# Patient Record
Sex: Female | Born: 1975 | Race: Black or African American | Hispanic: No | Marital: Single | Smoking: Never smoker
Health system: Southern US, Community
[De-identification: ages and names within clinical notes are randomized; demographics above are authoritative.]

## PROBLEM LIST (undated history)

## (undated) DIAGNOSIS — M1990 Unspecified osteoarthritis, unspecified site: Secondary | ICD-10-CM

## (undated) DIAGNOSIS — T7840XA Allergy, unspecified, initial encounter: Secondary | ICD-10-CM

## (undated) DIAGNOSIS — E785 Hyperlipidemia, unspecified: Secondary | ICD-10-CM

## (undated) DIAGNOSIS — D649 Anemia, unspecified: Secondary | ICD-10-CM

## (undated) HISTORY — DX: Allergy, unspecified, initial encounter: T78.40XA

## (undated) HISTORY — DX: Anemia, unspecified: D64.9

## (undated) HISTORY — PX: WISDOM TOOTH EXTRACTION: SHX21

## (undated) HISTORY — DX: Unspecified osteoarthritis, unspecified site: M19.90

## (undated) HISTORY — DX: Hyperlipidemia, unspecified: E78.5

---

## 2006-01-31 ENCOUNTER — Ambulatory Visit: Payer: Self-pay | Admitting: Family Medicine

## 2006-06-10 ENCOUNTER — Other Ambulatory Visit: Admission: RE | Admit: 2006-06-10 | Discharge: 2006-06-10 | Payer: Self-pay | Admitting: Family Medicine

## 2006-06-10 ENCOUNTER — Ambulatory Visit: Payer: Self-pay | Admitting: Family Medicine

## 2006-06-10 ENCOUNTER — Encounter: Payer: Self-pay | Admitting: Family Medicine

## 2006-06-15 ENCOUNTER — Ambulatory Visit: Payer: Self-pay | Admitting: Family Medicine

## 2007-07-20 ENCOUNTER — Ambulatory Visit: Payer: Self-pay | Admitting: Family Medicine

## 2007-07-20 DIAGNOSIS — R82998 Other abnormal findings in urine: Secondary | ICD-10-CM | POA: Insufficient documentation

## 2007-07-20 DIAGNOSIS — N760 Acute vaginitis: Secondary | ICD-10-CM | POA: Insufficient documentation

## 2007-07-20 LAB — CONVERTED CEMR LAB
Glucose, Urine, Semiquant: NEGATIVE
Protein, U semiquant: NEGATIVE
pH: 6

## 2007-07-21 ENCOUNTER — Encounter: Payer: Self-pay | Admitting: Family Medicine

## 2007-07-27 ENCOUNTER — Encounter (INDEPENDENT_AMBULATORY_CARE_PROVIDER_SITE_OTHER): Payer: Self-pay | Admitting: *Deleted

## 2007-08-14 ENCOUNTER — Encounter: Payer: Self-pay | Admitting: Family Medicine

## 2007-08-14 ENCOUNTER — Ambulatory Visit: Payer: Self-pay | Admitting: Family Medicine

## 2007-08-14 ENCOUNTER — Other Ambulatory Visit: Admission: RE | Admit: 2007-08-14 | Discharge: 2007-08-14 | Payer: Self-pay | Admitting: Family Medicine

## 2007-08-21 ENCOUNTER — Encounter (INDEPENDENT_AMBULATORY_CARE_PROVIDER_SITE_OTHER): Payer: Self-pay | Admitting: *Deleted

## 2007-09-11 ENCOUNTER — Ambulatory Visit: Payer: Self-pay | Admitting: Family Medicine

## 2007-09-12 LAB — CONVERTED CEMR LAB
BUN: 12 mg/dL (ref 6–23)
Basophils Relative: 0.1 % (ref 0.0–1.0)
CO2: 29 meq/L (ref 19–32)
Calcium: 9.5 mg/dL (ref 8.4–10.5)
Creatinine, Ser: 0.8 mg/dL (ref 0.4–1.2)
HDL: 83.1 mg/dL (ref >39.0)
Hemoglobin: 13.5 g/dL (ref 12.0–15.0)
LDL Cholesterol: 94 mg/dL (ref 0–99)
Monocytes Absolute: 0.4 10*3/uL (ref 0.2–0.7)
Monocytes Relative: 6.4 % (ref 3.0–11.0)
Platelets: 275 10*3/uL (ref 150–400)
RDW: 11.7 % (ref 11.5–14.6)
VLDL: 19 mg/dL (ref 0–40)

## 2008-12-24 ENCOUNTER — Ambulatory Visit: Payer: Self-pay | Admitting: Family Medicine

## 2008-12-24 DIAGNOSIS — L988 Other specified disorders of the skin and subcutaneous tissue: Secondary | ICD-10-CM | POA: Insufficient documentation

## 2008-12-24 LAB — CONVERTED CEMR LAB
Bilirubin Urine: NEGATIVE
Blood in Urine, dipstick: NEGATIVE
Glucose, Urine, Semiquant: NEGATIVE
Protein, U semiquant: NEGATIVE
pH: 6

## 2008-12-25 ENCOUNTER — Encounter (INDEPENDENT_AMBULATORY_CARE_PROVIDER_SITE_OTHER): Payer: Self-pay | Admitting: *Deleted

## 2008-12-25 ENCOUNTER — Encounter: Payer: Self-pay | Admitting: Family Medicine

## 2008-12-25 LAB — CONVERTED CEMR LAB: Chlamydia, Swab/Urine, PCR: NEGATIVE

## 2009-01-13 ENCOUNTER — Encounter (INDEPENDENT_AMBULATORY_CARE_PROVIDER_SITE_OTHER): Payer: Self-pay | Admitting: *Deleted

## 2009-01-23 ENCOUNTER — Ambulatory Visit: Payer: Self-pay | Admitting: Family Medicine

## 2009-01-23 ENCOUNTER — Encounter: Payer: Self-pay | Admitting: Family Medicine

## 2009-01-23 ENCOUNTER — Other Ambulatory Visit: Admission: RE | Admit: 2009-01-23 | Discharge: 2009-01-23 | Payer: Self-pay | Admitting: Family Medicine

## 2009-01-23 DIAGNOSIS — D239 Other benign neoplasm of skin, unspecified: Secondary | ICD-10-CM | POA: Insufficient documentation

## 2009-01-23 LAB — HM PAP SMEAR

## 2009-01-27 ENCOUNTER — Encounter (INDEPENDENT_AMBULATORY_CARE_PROVIDER_SITE_OTHER): Payer: Self-pay | Admitting: *Deleted

## 2009-01-27 LAB — CONVERTED CEMR LAB
ALT: 11 units/L (ref 0–35)
AST: 17 units/L (ref 0–37)
Albumin: 4.2 g/dL (ref 3.5–5.2)
BUN: 9 mg/dL (ref 6–23)
Basophils Relative: 0.4 % (ref 0.0–3.0)
Chloride: 106 meq/L (ref 96–112)
Creatinine, Ser: 0.8 mg/dL (ref 0.4–1.2)
Eosinophils Absolute: 0 10*3/uL (ref 0.0–0.7)
Eosinophils Relative: 0.5 % (ref 0.0–5.0)
GFR calc non Af Amer: 88 mL/min
Glucose, Bld: 87 mg/dL (ref 70–99)
HCT: 38.9 % (ref 36.0–46.0)
MCV: 94 fL (ref 78.0–100.0)
Monocytes Relative: 8.1 % (ref 3.0–12.0)
Neutrophils Relative %: 58.3 % (ref 43.0–77.0)
RBC: 4.14 M/uL (ref 3.87–5.11)
TSH: 0.56 microintl units/mL (ref 0.35–5.50)
Total Protein: 7.1 g/dL (ref 6.0–8.3)
VLDL: 18 mg/dL (ref 0–40)
WBC: 6 10*3/uL (ref 4.5–10.5)

## 2010-05-15 ENCOUNTER — Ambulatory Visit: Payer: Self-pay | Admitting: Family Medicine

## 2010-05-15 DIAGNOSIS — L293 Anogenital pruritus, unspecified: Secondary | ICD-10-CM | POA: Insufficient documentation

## 2010-05-15 DIAGNOSIS — Z87448 Personal history of other diseases of urinary system: Secondary | ICD-10-CM | POA: Insufficient documentation

## 2010-05-15 LAB — CONVERTED CEMR LAB
Ketones, urine, test strip: NEGATIVE
Nitrite: NEGATIVE
Specific Gravity, Urine: 1.015
WBC Urine, dipstick: NEGATIVE

## 2010-07-06 ENCOUNTER — Ambulatory Visit: Payer: Self-pay | Admitting: Family Medicine

## 2010-07-07 ENCOUNTER — Encounter: Payer: Self-pay | Admitting: Family Medicine

## 2010-07-07 LAB — CONVERTED CEMR LAB
Candida species: NEGATIVE
Chlamydia, Swab/Urine, PCR: NEGATIVE
GC Probe Amp, Urine: NEGATIVE
Gardnerella vaginalis: NEGATIVE

## 2011-01-26 NOTE — Assessment & Plan Note (Signed)
Summary: vaginal itching//lch   Vital Signs:  Patient profile:   35 year old female Height:      68.25 inches Weight:      141 pounds BMI:     21.36 Pulse rate:   82 / minute Pulse rhythm:   regular BP sitting:   110 / 80  (left arm) Cuff size:   regular  Vitals Entered By: Army Fossa CMA (May 15, 2010 8:41 AM) CC: Pt here for vaginal itching, had a yeast infection a few months ago now has itching that comes and goes, worse in the am., Vaginal discharge   History of Present Illness:  Vaginal discharge      This is a 35 year old woman who presents with Vaginal discharge.  The symptoms began 2 months ago.  Pt here c/o vaginal itching.  She had a yeast infection a few months ago and treated it with otc med---itching never completely cleared up.  The patient complains of itching and vaginal burning, but denies burning on urination, frequency, urgency, fever, pelvic pain, and back pain.  The patient denies the following symptoms: genital sores, unusual vaginal bleeding, painful intercourse, rash, myalgias, arthralgias, and headache.  Prior treatment has included OTC antifungals.    Current Medications (verified): 1)  Metrogel-Vaginal 0.75 % Gel (Metronidazole) .Marland Kitchen.. 1 Applicator Pv At Bedtime  Allergies (verified): No Known Drug Allergies  Past History:  Past medical, surgical, family and social histories (including risk factors) reviewed for relevance to current acute and chronic problems.  Past Medical History: Reviewed history from 08/14/2007 and no changes required. none  Past Surgical History: Reviewed history from 08/14/2007 and no changes required. Denies surgical history  Family History: Reviewed history from 08/14/2007 and no changes required. MGM- DM, heart disease Family History of CAD Female 1st degree relative <50--F Family History High cholesterol Breast CA-- M aunt MI-- m aunt  Social History: Reviewed history from 08/14/2007 and no changes  required. 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 Genitalia:  + scant amount white d/c no other lesions no odor Extremities:  No clubbing, cyanosis, edema, or deformity noted with normal full range of motion of all joints.   Psych:  Oriented X3 and normally interactive.     Impression & Recommendations:  Problem # 1:  BACTERIAL VAGINITIS (ICD-616.10)  Her updated medication list for this problem includes:    Metrogel-vaginal 0.75 % Gel (Metronidazole) .Marland Kitchen... 1 applicator pv at bedtime  Discussed symptomatic relief and treatment options.   Orders: Wet Prep (16109UE) UA Dipstick w/o Micro (manual) (45409)  Complete Medication List: 1)  Metrogel-vaginal 0.75 % Gel (Metronidazole) .Marland Kitchen.. 1 applicator pv at bedtime  Other Orders: T-Culture, Urine ([id number]) Prescriptions: METROGEL-VAGINAL 0.75 % GEL (METRONIDAZOLE) 1 applicator pv at bedtime  #5 x 0   Entered and Authorized by:   Loreen Freud DO   Signed by:   Loreen Freud DO on 05/15/2010   Method used:   [facility]ronically to        [facility] [phone]* (retail)       [address]       [place], [place]  [address]  Ph: [phone]       Fax: [phone]   RxID:   [id number]   Laboratory Results   Urine Tests    Routine Urinalysis   Color: yellow Appearance: Clear Glucose: negative   (Normal Range: Negative) Bilirubin: negative   (Normal Range: Negative) Ketone: negative   (Normal Range: Negative) Spec. Gravity: 1.015   (Normal Range: 1.003-1.035) Blood: trace-intact   (Normal Range: Negative) pH: 6.0   (Normal Range:  5.0-8.0) Protein: negative   (Normal Range: Negative) Urobilinogen: 0.2   (Normal Range: 0-1) Nitrite: negative   (Normal Range: Negative) Leukocyte Esterace: negative   (Normal Range: Negative)    Urine HCG: negative Comments: Army Fossa CMA  May 15, 2010 8:49 AM    Wet Mount Source: vaginal WBC/hpf: 1-5 Bacteria/hpf: 1+  Cocci Clue cells/hpf: few  Negative whiff Yeast/hpf: none Wet Mount KOH: Negative Trichomonas/hpf: none

## 2011-01-26 NOTE — Assessment & Plan Note (Signed)
Summary: ROA AND INJ   Vital Signs:  Patient profile:   35 year old female Height:      68.25 inches Weight:      136 pounds Temp:     98.3 degrees F oral Pulse rate:   57 / minute BP sitting:   90 / 58  (left arm)  Vitals Entered By: Jeremy Johann CMA (July 06, 2010 11:04 AM) CC: infection no better, tetanus shot, Vaginal discharge Comments REVIEWED MED LIST, PATIENT AGREED DOSE AND INSTRUCTION CORRECT    History of Present Illness:  Vaginal discharge      This is a 35 year old woman who presents with Vaginal discharge.  The patient complains of itching and vaginal burning, but denies burning on urination, frequency, urgency, fever, pelvic pain, and back pain.  The patient denies the following symptoms: genital sores, unusual vaginal bleeding, painful intercourse, rash, myalgias, arthralgias, and headache.  Prior treatment has included metronidazole.    Allergies (verified): No Known Drug Allergies  Physical Exam  General:  Well-developed,well-nourished,in no acute distress; alert,appropriate and cooperative throughout examination Genitalia:  normal introitus, no external lesions, and vaginal discharge.   thin white d/c cervix red--no blood Psych:  Oriented X3 and normally interactive.     Impression & Recommendations:  Problem # 1:  VAGINAL PRURITUS (ICD-698.1) ? BV metronidazole 500 mg two times a day  Orders: TLB-Wet Mount / Fungus (87210-[id number]) T-GC Probe, urine [phone]) T-Chlamydia  Probe, urine ([id number]) UA Dipstick w/o Micro (manual) (30865) consider gyn if no better    Complete Medication List: 1)  Metronidazole 500 Mg Tabs (Metronidazole) .Marland Kitchen.. 1 by mouth two times a day for 7 days  Other Orders: Tdap => 60yrs IM (78469) Admin 1st Vaccine (62952) Admin 1st Vaccine [facility]) [phone]) Prescriptions: METRONIDAZOLE 500 MG TABS (METRONIDAZOLE) 1 by mouth two times a day for 7 days  #14 x 0   Entered and Authorized by:   Loreen Freud DO   Signed by:    Loreen Freud DO on 07/06/2010   Method used:   [facility] to        [facility] [phone]* (retail)       [address]       [place], [place]  [address]       Ph: [phone]       Fax: [phone]   RxID:   [id number]    Tetanus/Td Vaccine    Vaccine Type: Tdap    Site: right deltoid    Mfr: GlaxoSmithKline    Dose: 0.5 ml    Route: IM    Given by: Jeremy Johann CMA    Exp. Date: 07/12/2011    Lot #: [id number]    VIS given: 11/14/07 version given July 06, 2010.

## 2011-07-07 ENCOUNTER — Encounter: Payer: Self-pay | Admitting: Family Medicine

## 2011-07-09 ENCOUNTER — Encounter: Payer: Self-pay | Admitting: Family Medicine

## 2011-08-20 ENCOUNTER — Other Ambulatory Visit (HOSPITAL_COMMUNITY)
Admission: RE | Admit: 2011-08-20 | Discharge: 2011-08-20 | Disposition: A | Discharge status: Home / Self Care | Payer: 59 | Source: Ambulatory Visit | Attending: Family Medicine | Admitting: Family Medicine

## 2011-08-20 ENCOUNTER — Ambulatory Visit (INDEPENDENT_AMBULATORY_CARE_PROVIDER_SITE_OTHER): Payer: 59 | Admitting: Family Medicine

## 2011-08-20 ENCOUNTER — Encounter: Payer: Self-pay | Admitting: Family Medicine

## 2011-08-20 VITALS — BP 102/66 | HR 79 | Temp 97.9°F | Ht 68.5 in | Wt 143.0 lb

## 2011-08-20 DIAGNOSIS — Z0000 Encounter for general adult medical examination without abnormal findings: Secondary | ICD-10-CM

## 2011-08-20 DIAGNOSIS — Z01419 Encounter for gynecological examination (general) (routine) without abnormal findings: Secondary | ICD-10-CM | POA: Insufficient documentation

## 2011-08-20 LAB — LIPID PANEL
Cholesterol: 187 mg/dL (ref 0–200)
HDL: 83.9 mg/dL (ref >39.00)
LDL Cholesterol: 83 mg/dL (ref 0–99)
VLDL: 20 mg/dL (ref 0.0–40.0)

## 2011-08-20 LAB — BASIC METABOLIC PANEL
CO2: 26 mEq/L (ref 19–32)
Calcium: 9.2 mg/dL (ref 8.4–10.5)
GFR: 96.54 mL/min (ref >60.00)
Glucose, Bld: 86 mg/dL (ref 70–99)
Potassium: 4.2 mEq/L (ref 3.5–5.1)
Sodium: 138 mEq/L (ref 135–145)

## 2011-08-20 LAB — CBC WITH DIFFERENTIAL/PLATELET
Basophils Absolute: 0 10*3/uL (ref 0.0–0.1)
Basophils Relative: 0.6 % (ref 0.0–3.0)
HCT: 40.1 % (ref 36.0–46.0)
Hemoglobin: 13.4 g/dL (ref 12.0–15.0)
Lymphocytes Relative: 32.2 % (ref 12.0–46.0)
Lymphs Abs: 1.9 10*3/uL (ref 0.7–4.0)
MCHC: 33.6 g/dL (ref 30.0–36.0)
Monocytes Relative: 7.3 % (ref 3.0–12.0)
Neutro Abs: 3.6 10*3/uL (ref 1.4–7.7)
RBC: 4.19 Mil/uL (ref 3.87–5.11)
RDW: 13 % (ref 11.5–14.6)

## 2011-08-20 LAB — POCT URINALYSIS DIPSTICK
Protein, UA: NEGATIVE
Spec Grav, UA: 1.025
Urobilinogen, UA: 0.2
pH, UA: 5

## 2011-08-20 LAB — HEPATIC FUNCTION PANEL
AST: 15 U/L (ref 0–37)
Alkaline Phosphatase: 65 U/L (ref 39–117)
Total Bilirubin: 0.7 mg/dL (ref 0.3–1.2)

## 2011-08-20 LAB — TSH: TSH: 0.41 u[IU]/mL (ref 0.35–5.50)

## 2011-08-20 NOTE — Progress Notes (Signed)
  Subjective:     Jeanne Davis is a 35 y.o. female and is here for a comprehensive physical exam. The patient reports no problems.  History   Social History  . Marital Status: Single    Spouse Name: N/A    Number of Children: N/A  . Years of Education: N/A   Occupational History  . dept [employer] [place] [employer]    Social History Main Topics  . Smoking status: Never Smoker   . Smokeless tobacco: Never Used  . Alcohol Use: No  . Drug Use: No  . Sexually Active: No   Other Topics Concern  . Not on file   Social History Narrative  . No narrative on file   Health Maintenance  Topic Date Due  . Pap Smear  01/24/2012  . Tetanus/tdap  07/06/2020    The following portions of the patient's history were reviewed and updated as appropriate: allergies, current medications, past family history, past medical history, past social history, past surgical history and problem list.  Review of Systems Review of Systems  Constitutional: Negative for activity change, appetite change and fatigue.  HENT: Negative for hearing loss, congestion, tinnitus and ear discharge.  dentist q41m Eyes: Negative for visual disturbance (optho- due) Respiratory: Negative for cough, chest tightness and shortness of breath.   Cardiovascular: Negative for chest pain, palpitations and leg swelling.  Gastrointestinal: Negative for abdominal pain, diarrhea, constipation and abdominal distention.  Genitourinary: Negative for urgency, frequency, decreased urine volume and difficulty urinating.  Musculoskeletal: Negative for back pain, arthralgias and gait problem.  Skin: Negative for color change, pallor and rash.  Neurological: Negative for dizziness, light-headedness, numbness and headaches.  Hematological: Negative for adenopathy. Does not bruise/bleed easily.  Psychiatric/Behavioral: Negative for suicidal ideas, confusion, sleep disturbance, self-injury, dysphoric mood, decreased concentration and agitation.        Objective:    BP 102/66  Pulse 79  Temp(Src) 97.9 F (36.6 C) (Oral)  Ht 5' 8.5" (1.74 m)  Wt 143 lb (64.864 kg)  BMI 21.43 kg/m2  SpO2 99%  LMP 07/31/2011 General appearance: alert, cooperative, appears stated age and no distress Head: Normocephalic, without obvious abnormality, atraumatic Eyes: conjunctivae/corneas clear. PERRL, EOM's intact. Fundi benign. Ears: normal TM's and external ear canals both ears Nose: Nares normal. Septum midline. Mucosa normal. No drainage or sinus tenderness. Throat: lips, mucosa, and tongue normal; teeth and gums normal Neck: no adenopathy, no carotid bruit, no JVD, supple, symmetrical, trachea midline and thyroid not enlarged, symmetric, no tenderness/mass/nodules Back: symmetric, no curvature. ROM normal. No CVA tenderness. Lungs: clear to auscultation bilaterally Breasts: normal appearance, no masses or tenderness Heart: regular rate and rhythm, S1, S2 normal, no murmur, click, rub or gallop Abdomen: soft, non-tender; bowel sounds normal; no masses,  no organomegaly Pelvic: cervix normal in appearance, external genitalia normal, no adnexal masses or tenderness, no cervical motion tenderness, rectovaginal septum normal, uterus normal size, shape, and consistency and vagina normal without discharge Extremities: extremities normal, atraumatic, no cyanosis or edema Pulses: 2+ and symmetric Skin: Skin color, texture, turgor normal. No rashes or lesions Lymph nodes: Cervical, supraclavicular, and axillary nodes normal. Neurologic: Alert and oriented X 3, normal strength and tone. Normal symmetric reflexes. Normal coordination and gait psych-- no depression or anxiety    Assessment:    Healthy female exam.    Plan:  ghm utd  Check fasting labs See After Visit Summary for Counseling Recommendations

## 2011-08-20 NOTE — Patient Instructions (Signed)

## 2012-05-15 ENCOUNTER — Ambulatory Visit (INDEPENDENT_AMBULATORY_CARE_PROVIDER_SITE_OTHER): Payer: 59 | Admitting: Family Medicine

## 2012-05-15 ENCOUNTER — Encounter: Payer: Self-pay | Admitting: Family Medicine

## 2012-05-15 VITALS — BP 108/64 | HR 75 | Temp 99.1°F | Wt 145.8 lb

## 2012-05-15 DIAGNOSIS — N760 Acute vaginitis: Secondary | ICD-10-CM

## 2012-05-15 DIAGNOSIS — N390 Urinary tract infection, site not specified: Secondary | ICD-10-CM

## 2012-05-15 LAB — POCT URINALYSIS DIPSTICK
Bilirubin, UA: NEGATIVE
Nitrite, UA: NEGATIVE
Spec Grav, UA: 1.02
pH, UA: 6.5

## 2012-05-15 MED ORDER — TERCONAZOLE 0.4 % VA CREA: 1.0000 | TOPICAL_CREAM | Freq: Every day | VAGINAL | Status: AC

## 2012-05-15 NOTE — Progress Notes (Signed)
  Subjective:    Jeanne Davis is a 36 y.o. female who presents for sexually transmitted disease check. Sexual history reviewed with the patient. STI Exposure: denies knowledge of risky exposure. Previous history of bacterial vaginosis. Current symptoms vaginal discharge: white and thick, vaginal irritation: moderate. Contraception: none   No LMP recorded.    The following portions of the patient's history were reviewed and updated as appropriate: allergies, current medications, past family history, past medical history, past social history, past surgical history and problem list.  Review of Systems Pertinent items are noted in HPI.    Objective:    BP 108/64  Pulse 75  Temp(Src) 99.1 F (37.3 C) (Oral)  Wt 145 lb 12.8 oz (66.134 kg)  SpO2 99% General:   alert, cooperative, appears stated age and no distress  Lymph Nodes:   Cervical, supraclavicular, and axillary nodes normal.  Pelvis:  External genitalia: + errythema Vaginal: normal without tenderness, induration or masses and discharge, green and yellow  Cultures:  GC and Chlamydia genprobes and wet prep     Assessment:    Possible STD exposure    Plan:    Appropriate educational material was distributed Will call pt with results RTC PRN

## 2012-05-16 LAB — GC/CHLAMYDIA PROBE AMP, URINE
Chlamydia, Swab/Urine, PCR: NEGATIVE
GC Probe Amp, Urine: NEGATIVE

## 2012-05-16 LAB — URINE CULTURE: Organism ID, Bacteria: NO GROWTH

## 2012-05-16 LAB — WET PREP BY MOLECULAR PROBE
Candida species: NEGATIVE
Gardnerella vaginalis: NEGATIVE
Trichomonas vaginosis: NEGATIVE

## 2012-05-17 ENCOUNTER — Other Ambulatory Visit: Payer: Self-pay | Admitting: *Deleted

## 2012-05-17 ENCOUNTER — Telehealth: Payer: Self-pay

## 2012-05-17 MED ORDER — METRONIDAZOLE 0.75 % VA GEL: 1.0000 | Freq: Two times a day (BID) | VAGINAL | Status: AC

## 2012-05-17 MED ORDER — FLUCONAZOLE 150 MG PO TABS: 150.0000 mg | ORAL_TABLET | Freq: Once | ORAL | Status: AC

## 2012-05-17 NOTE — Telephone Encounter (Signed)
Discussed with patient and she voiced understanding. Patient was still having symptoms and so Dr.Lowne advise Metrogel and Diflucan and if no relief she will need to see GYN.      KP

## 2012-05-17 NOTE — Telephone Encounter (Signed)
Received call from Terri with [facility] wanting clarification on order for pt Metro Vag Gel, per instructions note insert 2 times daily, note the box comes with only 5, gave verbal order for pt to insert 1 at night times 5 days per verbal order from MD [facility] verbal order given to this nurse, rep understood and will fill with new directions.

## 2012-05-17 NOTE — Telephone Encounter (Signed)
Message copied by Arnette Norris on Wed May 17, 2012  3:03 PM ------      Message from: Lelon Perla      Created: Tue May 16, 2012 11:46 AM       gc / chlamydia neg

## 2013-09-17 ENCOUNTER — Encounter: Payer: Self-pay | Admitting: Family Medicine

## 2013-09-17 ENCOUNTER — Ambulatory Visit (INDEPENDENT_AMBULATORY_CARE_PROVIDER_SITE_OTHER): Payer: 59 | Admitting: Family Medicine

## 2013-09-17 VITALS — BP 102/68 | HR 80 | Temp 98.3°F | Wt 143.2 lb

## 2013-09-17 DIAGNOSIS — D179 Benign lipomatous neoplasm, unspecified: Secondary | ICD-10-CM

## 2013-09-17 DIAGNOSIS — D1779 Benign lipomatous neoplasm of other sites: Secondary | ICD-10-CM

## 2013-09-17 DIAGNOSIS — Z23 Encounter for immunization: Secondary | ICD-10-CM

## 2013-09-17 DIAGNOSIS — B360 Pityriasis versicolor: Secondary | ICD-10-CM | POA: Insufficient documentation

## 2013-09-17 DIAGNOSIS — D171 Benign lipomatous neoplasm of skin and subcutaneous tissue of trunk: Secondary | ICD-10-CM | POA: Insufficient documentation

## 2013-09-17 NOTE — Progress Notes (Signed)
  Subjective:    Patient ID: Jeanne Davis, female    DOB: May 03, 1976, 37 y.o.   MRN: [id number]  HPI Pt here c/o soft tissue mass L side upper back.  Nontender.  Pt does not know how long it has been there. She is also c/o about hypopigmentation low back.  Pt was diagnosed with tinea versicolor. Pt is also requesting a flu shot.     Review of Systems As above     Objective:   Physical Exam BP 102/68  Pulse 80  Temp(Src) 98.3 F (36.8 C) (Oral)  Wt 143 lb 3.2 oz (64.955 kg)  BMI 21.45 kg/m2  SpO2 99% General appearance: alert, cooperative, appears stated age and no distress Back: soft tissue mass upper R back Skin: hypopigmentation - back  C/w tinea versicolor        Assessment & Plan:

## 2013-09-17 NOTE — Assessment & Plan Note (Signed)
nizoral shampoo  Consider derm if no improvement

## 2013-09-17 NOTE — Patient Instructions (Signed)
Lipoma A lipoma is a noncancerous (benign) tumor composed of fat cells. They are usually found under the skin (subcutaneous). A lipoma may occur in any tissue of the body that contains fat. Common areas for lipomas to appear include the back, shoulders, buttocks, and thighs. Lipomas are a very common soft tissue growth. They are soft and grow slowly. Most problems caused by a lipoma depend on where it is growing. DIAGNOSIS  A lipoma can be diagnosed with a physical exam. These tumors rarely become cancerous, but radiographic studies can help determine this for certain. Studies used may include:  Computerized X-ray scans (CT or CAT scan).  Computerized magnetic scans (MRI). TREATMENT  Small lipomas that are not causing problems may be watched. If a lipoma continues to enlarge or causes problems, removal is often the best treatment. Lipomas can also be removed to improve appearance. Surgery is done to remove the fatty cells and the surrounding capsule. Most often, this is done with medicine that numbs the area (local anesthetic). The removed tissue is examined under a microscope to make sure it is not cancerous. Keep all follow-up appointments with your caregiver. SEEK MEDICAL CARE IF:   The lipoma becomes larger or hard.  The lipoma becomes painful, red, or increasingly swollen. These could be signs of infection or a more serious condition. Document Released: 12/03/2002 Document Revised: 03/06/2012 Document Reviewed: 05/15/2010 [facility] Patient Information 2014 [place], [place].  Tinea Versicolor Tinea versicolor is a common yeast infection of the skin. This condition becomes known when the yeast on our skin starts to overgrow (yeast is a normal inhabitant on our skin). This condition is noticed as white or light brown patches on brown skin, and is more evident in the summer on tanned skin. These areas are slightly scaly if scratched. The light patches from the yeast become evident when the yeast  creates "holes in your suntan". This is most often noticed in the summer. The patches are usually located on the chest, back, pubis, neck and body folds. However, it may occur on any area of body. Mild itching and inflammation (redness or soreness) may be present. DIAGNOSIS  The diagnosisof this is made clinically (by looking). Cultures from samples are usually not needed. Examination under the microscope may help. However, yeast is normally found on skin. The diagnosis still remains clinical. Examination under Wood's Ultraviolet Light can determine the extent of the infection. TREATMENT  This common infection is usually only of cosmetic (only a concern to your appearance). It is easily treated with dandruff shampoo used during showers or bathing. Vigorous scrubbing will eliminate the yeast over several days time. The light areas in your skin may remain for weeks or months after the infection is cured unless your skin is exposed to sunlight. The lighter or darker spots caused by the fungus that remain after complete treatment are not a sign of treatment failure; it will take a long time to resolve. Your caregiver may recommend a number of commercial preparations or medication by mouth if home care is not working. Recurrence is common and preventative medication may be necessary. This skin condition is not highly contagious. Special care is not needed to protect close friends and family members. Normal hygiene is usually enough. Follow up is required only if you develop complications (such as a secondary infection from scratching), if recommended by your caregiver, or if no relief is obtained from the preparations used. Document Released: 12/10/2000 Document Revised: 03/06/2012 Document Reviewed: 01/22/2009 [employer] Patient Information 2014 [place],  [employer].  

## 2013-09-17 NOTE — Assessment & Plan Note (Signed)
con't to watch Pt reassured

## 2014-07-10 ENCOUNTER — Encounter: Payer: Self-pay | Admitting: Family Medicine

## 2014-07-10 ENCOUNTER — Other Ambulatory Visit (HOSPITAL_COMMUNITY)
Admission: RE | Admit: 2014-07-10 | Discharge: 2014-07-10 | Disposition: A | Discharge status: Home / Self Care | Payer: 59 | Source: Ambulatory Visit | Attending: Family Medicine | Admitting: Family Medicine

## 2014-07-10 ENCOUNTER — Ambulatory Visit (INDEPENDENT_AMBULATORY_CARE_PROVIDER_SITE_OTHER): Payer: 59 | Admitting: Family Medicine

## 2014-07-10 VITALS — BP 110/70 | HR 67 | Temp 98.3°F | Ht 68.0 in | Wt 145.8 lb

## 2014-07-10 DIAGNOSIS — Z1151 Encounter for screening for human papillomavirus (HPV): Secondary | ICD-10-CM | POA: Insufficient documentation

## 2014-07-10 DIAGNOSIS — Z01419 Encounter for gynecological examination (general) (routine) without abnormal findings: Secondary | ICD-10-CM | POA: Insufficient documentation

## 2014-07-10 DIAGNOSIS — Z124 Encounter for screening for malignant neoplasm of cervix: Secondary | ICD-10-CM

## 2014-07-10 DIAGNOSIS — Z0000 Encounter for general adult medical examination without abnormal findings: Secondary | ICD-10-CM

## 2014-07-10 LAB — POCT URINALYSIS DIPSTICK
Bilirubin, UA: NEGATIVE
Blood, UA: NEGATIVE
Glucose, UA: NEGATIVE
KETONES UA: NEGATIVE
LEUKOCYTES UA: NEGATIVE
NITRITE UA: NEGATIVE
PH UA: 6.5
PROTEIN UA: NEGATIVE
Spec Grav, UA: 1.01
UROBILINOGEN UA: 0.2

## 2014-07-10 LAB — LIPID PANEL
CHOL/HDL RATIO: 2
Cholesterol: 193 mg/dL (ref 0–200)
HDL: 82.7 mg/dL (ref >39.00)
LDL CALC: 93 mg/dL (ref 0–99)
NONHDL: 110.3
Triglycerides: 85 mg/dL (ref 0.0–149.0)
VLDL: 17 mg/dL (ref 0.0–40.0)

## 2014-07-10 LAB — BASIC METABOLIC PANEL
BUN: 10 mg/dL (ref 6–23)
CALCIUM: 9.3 mg/dL (ref 8.4–10.5)
CO2: 26 mEq/L (ref 19–32)
Chloride: 103 mEq/L (ref 96–112)
Creatinine, Ser: 0.8 mg/dL (ref 0.4–1.2)
GFR: 90.68 mL/min (ref >60.00)
GLUCOSE: 99 mg/dL (ref 70–99)
POTASSIUM: 4 meq/L (ref 3.5–5.1)
SODIUM: 135 meq/L (ref 135–145)

## 2014-07-10 LAB — CBC WITH DIFFERENTIAL/PLATELET
BASOS ABS: 0 10*3/uL (ref 0.0–0.1)
Basophils Relative: 0.3 % (ref 0.0–3.0)
EOS PCT: 0.8 % (ref 0.0–5.0)
Eosinophils Absolute: 0 10*3/uL (ref 0.0–0.7)
HCT: 38.3 % (ref 36.0–46.0)
HEMOGLOBIN: 12.7 g/dL (ref 12.0–15.0)
LYMPHS PCT: 31.8 % (ref 12.0–46.0)
Lymphs Abs: 1.9 10*3/uL (ref 0.7–4.0)
MCHC: 33.2 g/dL (ref 30.0–36.0)
MCV: 94 fl (ref 78.0–100.0)
MONOS PCT: 6.9 % (ref 3.0–12.0)
Monocytes Absolute: 0.4 10*3/uL (ref 0.1–1.0)
Neutro Abs: 3.6 10*3/uL (ref 1.4–7.7)
Neutrophils Relative %: 60.2 % (ref 43.0–77.0)
PLATELETS: 293 10*3/uL (ref 150.0–400.0)
RBC: 4.07 Mil/uL (ref 3.87–5.11)
RDW: 13.1 % (ref 11.5–15.5)
WBC: 6 10*3/uL (ref 4.0–10.5)

## 2014-07-10 LAB — HEPATIC FUNCTION PANEL
ALK PHOS: 66 U/L (ref 39–117)
ALT: 13 U/L (ref 0–35)
AST: 15 U/L (ref 0–37)
Albumin: 4.1 g/dL (ref 3.5–5.2)
BILIRUBIN DIRECT: 0 mg/dL (ref 0.0–0.3)
BILIRUBIN TOTAL: 0.5 mg/dL (ref 0.2–1.2)
Total Protein: 7.1 g/dL (ref 6.0–8.3)

## 2014-07-10 LAB — TSH: TSH: 0.62 u[IU]/mL (ref 0.35–4.50)

## 2014-07-10 NOTE — Progress Notes (Signed)
Subjective:     Jeanne Davis is a 38 y.o. female and is here for a comprehensive physical exam. The patient reports no problems.  History   Social History  . Marital Status: Single    Spouse Name: N/A    Number of Children: N/A  . Years of Education: N/A   Occupational History  . dept [employer] History Main Topics  . Smoking status: Never Smoker   . Smokeless tobacco: Never Used  . Alcohol Use: No  . Drug Use: No  . Sexual Activity: No   Other Topics Concern  . Not on file   Social History Narrative  . No narrative on file   Health Maintenance  Topic Date Due  . Influenza Vaccine  07/27/2014  . Pap Smear  08/19/2014  . Tetanus/tdap  07/06/2020    The following portions of the patient's history were reviewed and updated as appropriate:  She  has no past medical history on file. She  does not have any pertinent problems on file. She  has no past surgical history on file. Her family history includes Breast cancer in her maternal aunt; Cancer in her maternal aunt, maternal aunt, maternal uncle, and paternal aunt; Coronary artery disease in her father; Diabetes in her father, maternal grandmother, and sister; Heart attack in her maternal aunt; Heart disease in her maternal grandmother; Hyperlipidemia in her father and mother; Hypertension in her father. She  reports that she has never smoked. She has never used smokeless tobacco. She reports that she does not drink alcohol or use illicit drugs. She currently has no medications in their medication list. No current outpatient prescriptions on file prior to visit.   No current facility-administered medications on file prior to visit.   She has No Known Allergies..  Review of Systems Review of Systems  Constitutional: Negative for activity change, appetite change and fatigue.  HENT: Negative for hearing loss, congestion, tinnitus and ear discharge.  dentist q69m Eyes: Negative for visual disturbance  (see optho-- no) Respiratory: Negative for cough, chest tightness and shortness of breath.   Cardiovascular: Negative for chest pain, palpitations and leg swelling.  Gastrointestinal: Negative for abdominal pain, diarrhea, constipation and abdominal distention.  Genitourinary: Negative for urgency, frequency, decreased urine volume and difficulty urinating.  Musculoskeletal: Negative for back pain, arthralgias and gait problem.  Skin: Negative for color change, pallor and rash.  Neurological: Negative for dizziness, light-headedness, numbness and headaches.  Hematological: Negative for adenopathy. Does not bruise/bleed easily.  Psychiatric/Behavioral: Negative for suicidal ideas, confusion, sleep disturbance, self-injury, dysphoric mood, decreased concentration and agitation.       Objective:    BP 110/70  Pulse 67  Temp(Src) 98.3 F (36.8 C) (Oral)  Ht 5\' 8"  (1.727 m)  Wt 145 lb 12.8 oz (66.134 kg)  BMI 22.17 kg/m2  SpO2 98%  LMP 06/28/2014 General appearance: alert, cooperative, appears stated age and no distress Head: Normocephalic, without obvious abnormality, atraumatic Eyes: conjunctivae/corneas clear. PERRL, EOM's intact. Fundi benign. Ears: normal TM's and external ear canals both ears Nose: Nares normal. Septum midline. Mucosa normal. No drainage or sinus tenderness. Throat: lips, mucosa, and tongue normal; teeth and gums normal Neck: no adenopathy, no carotid bruit, no JVD, supple, symmetrical, trachea midline and thyroid not enlarged, symmetric, no tenderness/mass/nodules Back: symmetric, no curvature. ROM normal. No CVA tenderness. Lungs: clear to auscultation bilaterally Breasts: normal appearance, no masses or tenderness Heart: regular rate and rhythm, S1, S2 normal, no murmur, click,  rub or gallop Abdomen: soft, non-tender; bowel sounds normal; no masses,  no organomegaly Pelvic: cervix normal in appearance, external genitalia normal, no adnexal masses or  tenderness, no cervical motion tenderness, rectovaginal septum normal, uterus normal size, shape, and consistency, vagina normal without discharge and pap done Extremities: extremities normal, atraumatic, no cyanosis or edema Pulses: 2+ and symmetric Skin: Skin color, texture, turgor normal. No rashes or lesions Lymph nodes: Cervical, supraclavicular, and axillary nodes normal. Neurologic: Alert and oriented X 3, normal strength and tone. Normal symmetric reflexes. Normal coordination and gait Psych--no anxiety, no depression      Assessment:    Healthy female exam.      Plan:     ghm utd Check labs See After Visit Summary for Counseling Recommendations

## 2014-07-10 NOTE — Patient Instructions (Signed)
Preventive Care for Adults A healthy lifestyle and preventive care can promote health and wellness. Preventive health guidelines for women include the following key practices.  A routine yearly physical is a good way to check with your health care provider about your health and preventive screening. It is a chance to share any concerns and updates on your health and to receive a thorough exam.  Visit your dentist for a routine exam and preventive care every 6 months. Brush your teeth twice a day and floss once a day. Good oral hygiene prevents tooth decay and gum disease.  The frequency of eye exams is based on your age, health, family medical history, use of contact lenses, and other factors. Follow your health care provider's recommendations for frequency of eye exams.  Eat a healthy diet. Foods like vegetables, fruits, whole grains, low-fat dairy products, and lean protein foods contain the nutrients you need without too many calories. Decrease your intake of foods high in solid fats, added sugars, and salt. Eat the right amount of calories for you.Get information about a proper diet from your health care provider, if necessary.  Regular physical exercise is one of the most important things you can do for your health. Most adults should get at least 150 minutes of moderate-intensity exercise (any activity that increases your heart rate and causes you to sweat) each week. In addition, most adults need muscle-strengthening exercises on 2 or more days a week.  Maintain a healthy weight. The body mass index (BMI) is a screening tool to identify possible weight problems. It provides an estimate of body fat based on height and weight. Your health care provider can find your BMI, and can help you achieve or maintain a healthy weight.For adults 20 years and older:  A BMI below 18.5 is considered underweight.  A BMI of 18.5 to 24.9 is normal.  A BMI of 25 to 29.9 is considered overweight.  A BMI of  30 and above is considered obese.  Maintain normal blood lipids and cholesterol levels by exercising and minimizing your intake of saturated fat. Eat a balanced diet with plenty of fruit and vegetables. Blood tests for lipids and cholesterol should begin at age 52 and be repeated every 5 years. If your lipid or cholesterol levels are high, you are over 50, or you are at high risk for heart disease, you may need your cholesterol levels checked more frequently.Ongoing high lipid and cholesterol levels should be treated with medicines if diet and exercise are not working.  If you smoke, find out from your health care provider how to quit. If you do not use tobacco, do not start.  Lung cancer screening is recommended for adults aged 37-80 years who are at high risk for developing lung cancer because of a history of smoking. A yearly low-dose CT scan of the lungs is recommended for people who have at least a 30-pack-year history of smoking and are a current smoker or have quit within the past 15 years. A pack year of smoking is smoking an average of 1 pack of cigarettes a day for 1 year (for example: 1 pack a day for 30 years or 2 packs a day for 15 years). Yearly screening should continue until the smoker has stopped smoking for at least 15 years. Yearly screening should be stopped for people who develop a health problem that would prevent them from having lung cancer treatment.  If you are pregnant, do not drink alcohol. If you are breastfeeding,  be very cautious about drinking alcohol. If you are not pregnant and choose to drink alcohol, do not have more than 1 drink per day. One drink is considered to be 12 ounces (355 mL) of beer, 5 ounces (148 mL) of wine, or 1.5 ounces (44 mL) of liquor.  Avoid use of street drugs. Do not share needles with anyone. Ask for help if you need support or instructions about stopping the use of drugs.  High blood pressure causes heart disease and increases the risk of  stroke. Your blood pressure should be checked at least every 1 to 2 years. Ongoing high blood pressure should be treated with medicines if weight loss and exercise do not work.  If you are 75-52 years old, ask your health care provider if you should take aspirin to prevent strokes.  Diabetes screening involves taking a blood sample to check your fasting blood sugar level. This should be done once every 3 years, after age 15, if you are within normal weight and without risk factors for diabetes. Testing should be considered at a younger age or be carried out more frequently if you are overweight and have at least 1 risk factor for diabetes.  Breast cancer screening is essential preventive care for women. You should practice "breast self-awareness." This means understanding the normal appearance and feel of your breasts and may include breast self-examination. Any changes detected, no matter how small, should be reported to a health care provider. Women in their 58s and 30s should have a clinical breast exam (CBE) by a health care provider as part of a regular health exam every 1 to 3 years. After age 16, women should have a CBE every year. Starting at age 53, women should consider having a mammogram (breast X-ray test) every year. Women who have a family history of breast cancer should talk to their health care provider about genetic screening. Women at a high risk of breast cancer should talk to their health care providers about having an MRI and a mammogram every year.  Breast cancer gene (BRCA)-related cancer risk assessment is recommended for women who have family members with BRCA-related cancers. BRCA-related cancers include breast, ovarian, tubal, and peritoneal cancers. Having family members with these cancers may be associated with an increased risk for harmful changes (mutations) in the breast cancer genes BRCA1 and BRCA2. Results of the assessment will determine the need for genetic counseling and  BRCA1 and BRCA2 testing.  Routine pelvic exams to screen for cancer are no longer recommended for nonpregnant women who are considered low risk for cancer of the pelvic organs (ovaries, uterus, and vagina) and who do not have symptoms. Ask your health care provider if a screening pelvic exam is right for you.  If you have had past treatment for cervical cancer or a condition that could lead to cancer, you need Pap tests and screening for cancer for at least 20 years after your treatment. If Pap tests have been discontinued, your risk factors (such as having a new sexual partner) need to be reassessed to determine if screening should be resumed. Some women have medical problems that increase the chance of getting cervical cancer. In these cases, your health care provider may recommend more frequent screening and Pap tests.  The HPV test is an additional test that may be used for cervical cancer screening. The HPV test looks for the virus that can cause the cell changes on the cervix. The cells collected during the Pap test can be  tested for HPV. The HPV test could be used to screen women aged 47 years and older, and should be used in women of any age who have unclear Pap test results. After the age of 36, women should have HPV testing at the same frequency as a Pap test.  Colorectal cancer can be detected and often prevented. Most routine colorectal cancer screening begins at the age of 38 years and continues through age 58 years. However, your health care provider may recommend screening at an earlier age if you have risk factors for colon cancer. On a yearly basis, your health care provider may provide home test kits to check for hidden blood in the stool. Use of a small camera at the end of a tube, to directly examine the colon (sigmoidoscopy or colonoscopy), can detect the earliest forms of colorectal cancer. Talk to your health care provider about this at age 64, when routine screening begins. Direct  exam of the colon should be repeated every 5-10 years through age 21 years, unless early forms of pre-cancerous polyps or small growths are found.  People who are at an increased risk for hepatitis B should be screened for this virus. You are considered at high risk for hepatitis B if:  You were born in a country where hepatitis B occurs often. Talk with your health care provider about which countries are considered high risk.  Your parents were born in a high-risk country and you have not received a shot to protect against hepatitis B (hepatitis B vaccine).  You have HIV or AIDS.  You use needles to inject street drugs.  You live with, or have sex with, someone who has Hepatitis B.  You get hemodialysis treatment.  You take certain medicines for conditions like cancer, organ transplantation, and autoimmune conditions.  Hepatitis C blood testing is recommended for all people born from [birth year] through 1965 and any individual with known risks for hepatitis C.  Practice safe sex. Use condoms and avoid high-risk sexual practices to reduce the spread of sexually transmitted infections (STIs). STIs include gonorrhea, chlamydia, syphilis, trichomonas, herpes, HPV, and human immunodeficiency virus (HIV). Herpes, HIV, and HPV are viral illnesses that have no cure. They can result in disability, cancer, and death.  You should be screened for sexually transmitted illnesses (STIs) including gonorrhea and chlamydia if:  You are sexually active and are younger than 24 years.  You are older than 24 years and your health care provider tells you that you are at risk for this type of infection.  Your sexual activity has changed since you were last screened and you are at an increased risk for chlamydia or gonorrhea. Ask your health care provider if you are at risk.  If you are at risk of being infected with HIV, it is recommended that you take a prescription medicine daily to prevent HIV infection. This is  called preexposure prophylaxis (PrEP). You are considered at risk if:  You are a heterosexual woman, are sexually active, and are at increased risk for HIV infection.  You take drugs by injection.  You are sexually active with a partner who has HIV.  Talk with your health care provider about whether you are at high risk of being infected with HIV. If you choose to begin PrEP, you should first be tested for HIV. You should then be tested every 3 months for as long as you are taking PrEP.  Osteoporosis is a disease in which the bones lose minerals and strength  with aging. This can result in serious bone fractures or breaks. The risk of osteoporosis can be identified using a bone density scan. Women ages 65 years and over and women at risk for fractures or osteoporosis should discuss screening with their health care providers. Ask your health care provider whether you should take a calcium supplement or vitamin D to reduce the rate of osteoporosis.  Menopause can be associated with physical symptoms and risks. Hormone replacement therapy is available to decrease symptoms and risks. You should talk to your health care provider about whether hormone replacement therapy is right for you.  Use sunscreen. Apply sunscreen liberally and repeatedly throughout the day. You should seek shade when your shadow is shorter than you. Protect yourself by wearing long sleeves, pants, a wide-brimmed hat, and sunglasses year round, whenever you are outdoors.  Once a month, do a whole body skin exam, using a mirror to look at the skin on your back. Tell your health care provider of new moles, moles that have irregular borders, moles that are larger than a pencil eraser, or moles that have changed in shape or color.  Stay current with required vaccines (immunizations).  Influenza vaccine. All adults should be immunized every year.  Tetanus, diphtheria, and acellular pertussis (Td, Tdap) vaccine. Pregnant women should  receive 1 dose of Tdap vaccine during each pregnancy. The dose should be obtained regardless of the length of time since the last dose. Immunization is preferred during the 27th-36th week of gestation. An adult who has not previously received Tdap or who does not know her vaccine status should receive 1 dose of Tdap. This initial dose should be followed by tetanus and diphtheria toxoids (Td) booster doses every 10 years. Adults with an unknown or incomplete history of completing a 3-dose immunization series with Td-containing vaccines should begin or complete a primary immunization series including a Tdap dose. Adults should receive a Td booster every 10 years.  Varicella vaccine. An adult without evidence of immunity to varicella should receive 2 doses or a second dose if she has previously received 1 dose. Pregnant females who do not have evidence of immunity should receive the first dose after pregnancy. This first dose should be obtained before leaving the health care facility. The second dose should be obtained 4-8 weeks after the first dose.  Human papillomavirus (HPV) vaccine. Females aged 13-26 years who have not received the vaccine previously should obtain the 3-dose series. The vaccine is not recommended for use in pregnant females. However, pregnancy testing is not needed before receiving a dose. If a female is found to be pregnant after receiving a dose, no treatment is needed. In that case, the remaining doses should be delayed until after the pregnancy. Immunization is recommended for any person with an immunocompromised condition through the age of 26 years if she did not get any or all doses earlier. During the 3-dose series, the second dose should be obtained 4-8 weeks after the first dose. The third dose should be obtained 24 weeks after the first dose and 16 weeks after the second dose.  Zoster vaccine. One dose is recommended for adults aged 60 years or older unless certain conditions are  present.  Measles, mumps, and rubella (MMR) vaccine. Adults born before 1957 generally are considered immune to measles and mumps. Adults born in 1957 or later should have 1 or more doses of MMR vaccine unless there is a contraindication to the vaccine or there is laboratory evidence of immunity to   each of the three diseases. A routine second dose of MMR vaccine should be obtained at least 28 days after the first dose for students attending postsecondary schools, health care workers, or international travelers. People who received inactivated measles vaccine or an unknown type of measles vaccine during 1963-1967 should receive 2 doses of MMR vaccine. People who received inactivated mumps vaccine or an unknown type of mumps vaccine before 1979 and are at high risk for mumps infection should consider immunization with 2 doses of MMR vaccine. For females of childbearing age, rubella immunity should be determined. If there is no evidence of immunity, females who are not pregnant should be vaccinated. If there is no evidence of immunity, females who are pregnant should delay immunization until after pregnancy. Unvaccinated health care workers born before 1957 who lack laboratory evidence of measles, mumps, or rubella immunity or laboratory confirmation of disease should consider measles and mumps immunization with 2 doses of MMR vaccine or rubella immunization with 1 dose of MMR vaccine.  Pneumococcal 13-valent conjugate (PCV13) vaccine. When indicated, a person who is uncertain of her immunization history and has no record of immunization should receive the PCV13 vaccine. An adult aged 19 years or older who has certain medical conditions and has not been previously immunized should receive 1 dose of PCV13 vaccine. This PCV13 should be followed with a dose of pneumococcal polysaccharide (PPSV23) vaccine. The PPSV23 vaccine dose should be obtained at least 8 weeks after the dose of PCV13 vaccine. An adult aged 19  years or older who has certain medical conditions and previously received 1 or more doses of PPSV23 vaccine should receive 1 dose of PCV13. The PCV13 vaccine dose should be obtained 1 or more years after the last PPSV23 vaccine dose.  Pneumococcal polysaccharide (PPSV23) vaccine. When PCV13 is also indicated, PCV13 should be obtained first. All adults aged 65 years and older should be immunized. An adult younger than age 65 years who has certain medical conditions should be immunized. Any person who resides in a nursing home or long-term care facility should be immunized. An adult smoker should be immunized. People with an immunocompromised condition and certain other conditions should receive both PCV13 and PPSV23 vaccines. People with human immunodeficiency virus (HIV) infection should be immunized as soon as possible after diagnosis. Immunization during chemotherapy or radiation therapy should be avoided. Routine use of PPSV23 vaccine is not recommended for American Indians, [place] Natives, or people younger than 65 years unless there are medical conditions that require PPSV23 vaccine. When indicated, people who have unknown immunization and have no record of immunization should receive PPSV23 vaccine. One-time revaccination 5 years after the first dose of PPSV23 is recommended for people aged 19-64 years who have chronic kidney failure, nephrotic syndrome, asplenia, or immunocompromised conditions. People who received 1-2 doses of PPSV23 before age 65 years should receive another dose of PPSV23 vaccine at age 65 years or later if at least 5 years have passed since the previous dose. Doses of PPSV23 are not needed for people immunized with PPSV23 at or after age 65 years.  Meningococcal vaccine. Adults with asplenia or persistent complement component deficiencies should receive 2 doses of quadrivalent meningococcal conjugate (MenACWY-D) vaccine. The doses should be obtained at least 2 months apart.  Microbiologists working with certain meningococcal bacteria, military recruits, people at risk during an outbreak, and people who travel to or live in countries with a high rate of meningitis should be immunized. A first-year college student up through age   21 years who is living in a residence hall should receive a dose if she did not receive a dose on or after her 16th birthday. Adults who have certain high-risk conditions should receive one or more doses of vaccine.  Hepatitis A vaccine. Adults who wish to be protected from this disease, have certain high-risk conditions, work with hepatitis A-infected animals, work in hepatitis A research labs, or travel to or work in countries with a high rate of hepatitis A should be immunized. Adults who were previously unvaccinated and who anticipate close contact with an international adoptee during the first 60 days after arrival in the [place] from a country with a high rate of hepatitis A should be immunized.  Hepatitis B vaccine. Adults who wish to be protected from this disease, have certain high-risk conditions, may be exposed to blood or other infectious body fluids, are household contacts or sex partners of hepatitis B positive people, are clients or workers in certain care facilities, or travel to or work in countries with a high rate of hepatitis B should be immunized.  Haemophilus influenzae type b (Hib) vaccine. A previously unvaccinated person with asplenia or sickle cell disease or having a scheduled splenectomy should receive 1 dose of Hib vaccine. Regardless of previous immunization, a recipient of a hematopoietic stem cell transplant should receive a 3-dose series 6-12 months after her successful transplant. Hib vaccine is not recommended for adults with HIV infection. Preventive Services / Frequency Ages 43 to 39years  Blood pressure check.** / Every 1 to 2 years.  Lipid and cholesterol check.** / Every 5 years beginning at age  75.  Clinical breast exam.** / Every 3 years for women in their 32s and 74s.  BRCA-related cancer risk assessment.** / For women who have family members with a BRCA-related cancer (breast, ovarian, tubal, or peritoneal cancers).  Pap test.** / Every 2 years from ages 65 through 91. Every 3 years starting at age 34 through age 93 or 72 with a history of 3 consecutive normal Pap tests.  HPV screening.** / Every 3 years from ages 46 through ages 53 to 26 with a history of 3 consecutive normal Pap tests.  Hepatitis C blood test.** / For any individual with known risks for hepatitis C.  Skin self-exam. / Monthly.  Influenza vaccine. / Every year.  Tetanus, diphtheria, and acellular pertussis (Tdap, Td) vaccine.** / Consult your health care provider. Pregnant women should receive 1 dose of Tdap vaccine during each pregnancy. 1 dose of Td every 10 years.  Varicella vaccine.** / Consult your health care provider. Pregnant females who do not have evidence of immunity should receive the first dose after pregnancy.  HPV vaccine. / 3 doses over 6 months, if 70 and younger. The vaccine is not recommended for use in pregnant females. However, pregnancy testing is not needed before receiving a dose.  Measles, mumps, rubella (MMR) vaccine.** / You need at least 1 dose of MMR if you were born in 1957 or later. You may also need a 2nd dose. For females of childbearing age, rubella immunity should be determined. If there is no evidence of immunity, females who are not pregnant should be vaccinated. If there is no evidence of immunity, females who are pregnant should delay immunization until after pregnancy.  Pneumococcal 13-valent conjugate (PCV13) vaccine.** / Consult your health care provider.  Pneumococcal polysaccharide (PPSV23) vaccine.** / 1 to 2 doses if you smoke cigarettes or if you have certain conditions.  Meningococcal vaccine.** /  1 dose if you are age 70 to 51 years and a [place] living in a residence hall, or have one of several medical conditions, you need to get vaccinated against meningococcal disease. You may also need additional booster doses.  Hepatitis A vaccine.** / Consult your health care provider.  Hepatitis B vaccine.** / Consult your health care provider.  Haemophilus influenzae type b (Hib) vaccine.** / Consult your health care provider. Ages 40 to 64years  Blood pressure check.** / Every 1 to 2 years.  Lipid and cholesterol check.** / Every 5 years beginning at age 58 years.  Lung cancer screening. / Every year if you are aged 56-80 years and have a 30-pack-year history of smoking and currently smoke or have quit within the past 15 years. Yearly screening is stopped once you have quit smoking for at least 15 years or develop a health problem that would prevent you from having lung cancer treatment.  Clinical breast exam.** / Every year after age 35 years.  BRCA-related cancer risk assessment.** / For women who have family members with a BRCA-related cancer (breast, ovarian, tubal, or peritoneal cancers).  Mammogram.** / Every year beginning at age 109 years and continuing for as long as you are in good health. Consult with your health care provider.  Pap test.** / Every 3 years starting at age 44 years through age 94 or 70 years with a history of 3 consecutive normal Pap tests.  HPV screening.** / Every 3 years from ages 109 years through ages 50 to 30 years with a history of 3 consecutive normal Pap tests.  Fecal occult blood test (FOBT) of stool. / Every year beginning at age 73 years and continuing until age 59 years. You may not need to do this test if you get a colonoscopy every 10 years.  Flexible sigmoidoscopy or colonoscopy.** / Every 5 years for a flexible sigmoidoscopy or every 10 years for a colonoscopy beginning at age 68 years and continuing until age 12 years.  Hepatitis C blood test.** / For all people born from [birth year] through  1965 and any individual with known risks for hepatitis C.  Skin self-exam. / Monthly.  Influenza vaccine. / Every year.  Tetanus, diphtheria, and acellular pertussis (Tdap/Td) vaccine.** / Consult your health care provider. Pregnant women should receive 1 dose of Tdap vaccine during each pregnancy. 1 dose of Td every 10 years.  Varicella vaccine.** / Consult your health care provider. Pregnant females who do not have evidence of immunity should receive the first dose after pregnancy.  Zoster vaccine.** / 1 dose for adults aged 2 years or older.  Measles, mumps, rubella (MMR) vaccine.** / You need at least 1 dose of MMR if you were born in 1957 or later. You may also need a 2nd dose. For females of childbearing age, rubella immunity should be determined. If there is no evidence of immunity, females who are not pregnant should be vaccinated. If there is no evidence of immunity, females who are pregnant should delay immunization until after pregnancy.  Pneumococcal 13-valent conjugate (PCV13) vaccine.** / Consult your health care provider.  Pneumococcal polysaccharide (PPSV23) vaccine.** / 1 to 2 doses if you smoke cigarettes or if you have certain conditions.  Meningococcal vaccine.** / Consult your health care provider.  Hepatitis A vaccine.** / Consult your health care provider.  Hepatitis B vaccine.** / Consult your health care provider.  Haemophilus influenzae type b (Hib) vaccine.** / Consult your health care provider. Ages 48 years  and over  Blood pressure check.** / Every 1 to 2 years.  Lipid and cholesterol check.** / Every 5 years beginning at age 84 years.  Lung cancer screening. / Every year if you are aged 50-80 years and have a 30-pack-year history of smoking and currently smoke or have quit within the past 15 years. Yearly screening is stopped once you have quit smoking for at least 15 years or develop a health problem that would prevent you from having lung cancer  treatment.  Clinical breast exam.** / Every year after age 24 years.  BRCA-related cancer risk assessment.** / For women who have family members with a BRCA-related cancer (breast, ovarian, tubal, or peritoneal cancers).  Mammogram.** / Every year beginning at age 14 years and continuing for as long as you are in good health. Consult with your health care provider.  Pap test.** / Every 3 years starting at age 17 years through age 31 or 74 years with 3 consecutive normal Pap tests. Testing can be stopped between 65 and 70 years with 3 consecutive normal Pap tests and no abnormal Pap or HPV tests in the past 10 years.  HPV screening.** / Every 3 years from ages 30 years through ages 70 or 28 years with a history of 3 consecutive normal Pap tests. Testing can be stopped between 65 and 70 years with 3 consecutive normal Pap tests and no abnormal Pap or HPV tests in the past 10 years.  Fecal occult blood test (FOBT) of stool. / Every year beginning at age 64 years and continuing until age 92 years. You may not need to do this test if you get a colonoscopy every 10 years.  Flexible sigmoidoscopy or colonoscopy.** / Every 5 years for a flexible sigmoidoscopy or every 10 years for a colonoscopy beginning at age 73 years and continuing until age 39 years.  Hepatitis C blood test.** / For all people born from [birth year] through 1965 and any individual with known risks for hepatitis C.  Osteoporosis screening.** / A one-time screening for women ages 35 years and over and women at risk for fractures or osteoporosis.  Skin self-exam. / Monthly.  Influenza vaccine. / Every year.  Tetanus, diphtheria, and acellular pertussis (Tdap/Td) vaccine.** / 1 dose of Td every 10 years.  Varicella vaccine.** / Consult your health care provider.  Zoster vaccine.** / 1 dose for adults aged 59 years or older.  Pneumococcal 13-valent conjugate (PCV13) vaccine.** / Consult your health care provider.  Pneumococcal  polysaccharide (PPSV23) vaccine.** / 1 dose for all adults aged 8 years and older.  Meningococcal vaccine.** / Consult your health care provider.  Hepatitis A vaccine.** / Consult your health care provider.  Hepatitis B vaccine.** / Consult your health care provider.  Haemophilus influenzae type b (Hib) vaccine.** / Consult your health care provider. ** Family history and personal history of risk and conditions may change your health care provider's recommendations. Document Released: 02/08/2002 Document Revised: 12/18/2013 Document Reviewed: 05/10/2011 [facility] Patient Information 2015 [place], [place]. This information is not intended to replace advice given to you by your health care provider. Make sure you discuss any questions you have with your health care provider.

## 2014-07-10 NOTE — Addendum Note (Signed)
Addended by: Ewing Schlein on: 07/10/2014 09:44 AM   Modules accepted: Orders

## 2014-07-10 NOTE — Progress Notes (Signed)
Pre visit review using our clinic review tool, if applicable. No additional management support is needed unless otherwise documented below in the visit note. 

## 2014-07-11 LAB — CYTOLOGY - PAP

## 2014-07-15 ENCOUNTER — Encounter: Payer: Self-pay | Admitting: General Practice

## 2015-06-10 ENCOUNTER — Ambulatory Visit (INDEPENDENT_AMBULATORY_CARE_PROVIDER_SITE_OTHER): Payer: 59 | Admitting: Medical

## 2015-06-10 ENCOUNTER — Encounter: Payer: Self-pay | Admitting: Medical

## 2015-06-10 VITALS — BP 105/71 | HR 104 | Temp 99.0°F | Ht 68.0 in | Wt 143.6 lb

## 2015-06-10 DIAGNOSIS — J209 Acute bronchitis, unspecified: Secondary | ICD-10-CM | POA: Insufficient documentation

## 2015-06-10 MED ORDER — HYDROCODONE-HOMATROPINE 5-1.5 MG/5ML PO SYRP: 5.0000 mL | ORAL_SOLUTION | Freq: Three times a day (TID) | ORAL | Status: DC | PRN

## 2015-06-10 MED ORDER — MOMETASONE FUROATE 50 MCG/ACT NA SUSP: NASAL | Status: DC

## 2015-06-10 MED ORDER — AZITHROMYCIN 250 MG PO TABS: ORAL_TABLET | ORAL | Status: DC

## 2015-06-10 NOTE — Assessment & Plan Note (Signed)
You appear to have bronchitis. Rest hydrate and tylenol for fever. I am prescribing cough medicine hydromet, and azithromycin antibiotic. For your nasal congestion nasonex nasal spray.  You should gradually get better. If not then notify us and would recommend a chest xray.  Follow up in 7-10 days or as needed

## 2015-06-10 NOTE — Progress Notes (Signed)
Pre visit review using our clinic review tool, if applicable. No additional management support is needed unless otherwise documented below in the visit note. 

## 2015-06-10 NOTE — Patient Instructions (Signed)
Acute bronchitis You appear to have bronchitis. Rest hydrate and tylenol for fever. I am prescribing cough medicine hydromet, and azithromycin antibiotic. For your nasal congestion nasonex nasal spray.  You should gradually get better. If not then notify us and would recommend a chest xray.  Follow up in 7-10 days or as needed

## 2015-06-10 NOTE — Progress Notes (Signed)
Subjective:    Patient ID: Jeanne Davis, female    DOB: 21-Feb-1976, 39 y.o.   MRN: [id number]  HPI   Pt in states since past wed nasal and chest congestion. No sinus pressure. Coughing up mucous. No hx of bronchitis. Pt not a smoker. No fever, no chills or sweats.   Cough is keeping her up at night. No wheezing. NO use of inhalers in her life.  Intermittent cough all day long. Pt not sleeping well with cough. Pt tried some med otc(Tussin CF)  LMP- Saturday.   Review of Systems  Constitutional: Negative for fever, chills and fatigue.  HENT: Positive for congestion. Negative for ear discharge, ear pain, facial swelling, mouth sores, postnasal drip, sneezing, sore throat and trouble swallowing.   Respiratory: Positive for cough. Negative for choking, shortness of breath and wheezing.   Cardiovascular: Negative for chest pain and palpitations.  Gastrointestinal: Negative for abdominal pain and anal bleeding.  Musculoskeletal: Negative for back pain.  Neurological: Negative for seizures, facial asymmetry, speech difficulty, weakness, numbness and headaches.  Hematological: Negative for adenopathy. Does not bruise/bleed easily.  Psychiatric/Behavioral: Negative for behavioral problems and confusion.     No past medical history on file.  History   Social History  . Marital Status: Single    Spouse Name: N/A  . Number of Children: N/A  . Years of Education: N/A   Occupational History  . dept [employer] History Main Topics  . Smoking status: Never Smoker   . Smokeless tobacco: Never Used  . Alcohol Use: No  . Drug Use: No  . Sexual Activity: No   Other Topics Concern  . Not on file   Social History Narrative    No past surgical history on file.  Family History  Problem Relation Age of Onset  . Diabetes Maternal Grandmother   . Heart disease Maternal Grandmother     CHF  . Coronary artery disease Father   . Hyperlipidemia Father   . Diabetes  Father   . Hypertension Father   . Breast cancer Maternal Aunt   . Cancer Maternal Aunt     breast ca  . Heart attack Maternal Aunt   . Cancer Maternal Aunt     fallopian tubes  . Hyperlipidemia Mother   . Diabetes Sister     type I---half sister  . Cancer Maternal Uncle     tumor kidney, colon cancer  . Cancer Paternal Aunt      breast    No Known Allergies  No current outpatient prescriptions on file prior to visit.   No current facility-administered medications on file prior to visit.    BP 105/71 mmHg  Pulse 104  Temp(Src) 99 F (37.2 C) (Oral)  Ht 5\' 8"  (1.727 m)  Wt 143 lb 9.6 oz (65.137 kg)  BMI 21.84 kg/m2  SpO2 97%  LMP 06/07/2015       Objective:   Physical Exam  General  Mental Status - Alert. General Appearance - Well groomed. Not in acute distress. Congested nasally.  Skin Rashes- No Rashes.  HEENT Head- Normal. Ear Auditory Canal - Left- Normal. Right - Normal.Tympanic Membrane- Left- Normal. Right- Normal. Eye Sclera/Conjunctiva- Left- Normal. Right- Normal. Nose & Sinuses Nasal Mucosa- Left-  Booggy + Congested. Right-  Boggy + Congested. No sinus pressure. Mouth & Throat Lips: Upper Lip- Normal: no dryness, cracking, pallor, cyanosis, or vesicular eruption. Lower Lip-Normal: no dryness, cracking, pallor, cyanosis or vesicular eruption. Buccal  Mucosa- Bilateral- No Aphthous ulcers. Oropharynx- No Discharge or Erythema. +pnd Tonsils: Characteristics- Bilateral- No Erythema or Congestion. Size/Enlargement- Bilateral- No enlargement. Discharge- bilateral-None.  Neck Neck- Supple. No Masses.   Chest and Lung Exam Auscultation: Breath Sounds:- even and unlabored, but very faint bilateral upper lobe rhonchi.  Cardiovascular Auscultation:Rythm- Regular, rate and rhythm. Murmurs & Other Heart Sounds:Ausculatation of the heart reveal- No Murmurs.  Lymphatic Head & Neck General Head & Neck Lymphatics: Bilateral: Description- No Localized  lymphadenopathy.       Assessment & Plan:

## 2015-06-23 ENCOUNTER — Telehealth: Payer: Self-pay | Admitting: Family Medicine

## 2015-06-23 NOTE — Telephone Encounter (Signed)
Pre visit letter mailed 06/23/15

## 2015-07-14 ENCOUNTER — Telehealth: Payer: Self-pay | Admitting: Family Medicine

## 2015-07-14 ENCOUNTER — Encounter: Payer: 59 | Admitting: Family Medicine

## 2015-07-14 NOTE — Telephone Encounter (Signed)
pre visit letter mailed 07/10/15

## 2015-07-30 ENCOUNTER — Encounter: Payer: Self-pay | Admitting: *Deleted

## 2015-07-30 ENCOUNTER — Telehealth: Payer: Self-pay | Admitting: *Deleted

## 2015-07-30 NOTE — Telephone Encounter (Signed)
Unable to reach patient at time of Pre-Visit Call.  Left message for patient to return call when available.    

## 2015-07-30 NOTE — Addendum Note (Signed)
Addended by: Leticia Penna A on: 07/30/2015 03:23 PM   Modules accepted: Orders, Medications

## 2015-07-30 NOTE — Telephone Encounter (Signed)
Pre-Visit Call completed with patient and chart updated.   Pre-Visit Info documented in Specialty Comments under [employer].    

## 2015-07-31 ENCOUNTER — Ambulatory Visit (INDEPENDENT_AMBULATORY_CARE_PROVIDER_SITE_OTHER): Payer: 59 | Admitting: Family Medicine

## 2015-07-31 ENCOUNTER — Encounter: Payer: Self-pay | Admitting: Family Medicine

## 2015-07-31 VITALS — BP 106/64 | HR 59 | Temp 98.7°F | Ht 68.0 in | Wt 148.2 lb

## 2015-07-31 DIAGNOSIS — Z0000 Encounter for general adult medical examination without abnormal findings: Secondary | ICD-10-CM | POA: Diagnosis not present

## 2015-07-31 DIAGNOSIS — L700 Acne vulgaris: Secondary | ICD-10-CM

## 2015-07-31 NOTE — Progress Notes (Signed)
Pre visit review using our clinic review tool, if applicable. No additional management support is needed unless otherwise documented below in the visit note. 

## 2015-07-31 NOTE — Progress Notes (Signed)
Subjective:     Jeanne Davis is a 39 y.o. female and is here for a comprehensive physical exam. The patient reports -- acne  History   Social History  . Marital Status: Single    Spouse Name: N/A  . Number of Children: N/A  . Years of Education: N/A   Occupational History  . dept [employer] History Main Topics  . Smoking status: Never Smoker   . Smokeless tobacco: Never Used  . Alcohol Use: No  . Drug Use: No  . Sexual Activity: No   Other Topics Concern  . Not on file   Social History Narrative   Exercise --- no   Health Maintenance  Topic Date Due  . INFLUENZA VACCINE  09/30/2015 (Originally 07/28/2015)  . PAP SMEAR  07/10/2017  . TETANUS/TDAP  07/06/2020  . HIV Screening  Completed    The following portions of the patient's history were reviewed and updated as appropriate:  She  has no past medical history on file. She  does not have any pertinent problems on file. She  has past surgical history that includes Wisdom tooth extraction. Her family history includes Breast cancer in her maternal aunt; Cancer in her maternal aunt, maternal aunt, maternal uncle, and paternal aunt; Coronary artery disease in her father; Diabetes in her father, maternal grandmother, and sister; Heart attack in her maternal aunt; Heart disease in her maternal grandmother; Hyperlipidemia in her father and mother; Hypertension in her father. She  reports that she has never smoked. She has never used smokeless tobacco. She reports that she does not drink alcohol or use illicit drugs. She currently has no medications in their medication list. No current outpatient prescriptions on file prior to visit.   No current facility-administered medications on file prior to visit.   She has No Known Allergies..  Review of Systems Review of Systems  Constitutional: Negative for activity change, appetite change and fatigue.  HENT: Negative for hearing loss, congestion, tinnitus and ear  discharge.  dentist q27m Eyes: Negative for visual disturbance (see opto--no) Respiratory: Negative for cough, chest tightness and shortness of breath.   Cardiovascular: Negative for chest pain, palpitations and leg swelling.  Gastrointestinal: Negative for abdominal pain, diarrhea, constipation and abdominal distention.  Genitourinary: Negative for urgency, frequency, decreased urine volume and difficulty urinating.  Musculoskeletal: Negative for back pain, arthralgias and gait problem.  Skin: Negative for color change, pallor and rash.  Neurological: Negative for dizziness, light-headedness, numbness and headaches.  Hematological: Negative for adenopathy. Does not bruise/bleed easily.  Psychiatric/Behavioral: Negative for suicidal ideas, confusion, sleep disturbance, self-injury, dysphoric mood, decreased concentration and agitation.       Objective:    BP 106/64 mmHg  Pulse 59  Temp(Src) 98.7 F (37.1 C) (Oral)  Ht 5\' 8"  (1.727 m)  Wt 148 lb 3.2 oz (67.223 kg)  BMI 22.54 kg/m2  SpO2 99%  LMP 07/04/2015 General appearance: alert, cooperative, appears stated age and no distress Head: Normocephalic, without obvious abnormality, atraumatic Eyes: conjunctivae/corneas clear. PERRL, EOM's intact. Fundi benign. Ears: normal TM's and external ear canals both ears Nose: Nares normal. Septum midline. Mucosa normal. No drainage or sinus tenderness. Throat: lips, mucosa, and tongue normal; teeth and gums normal Neck: no adenopathy, no carotid bruit, no JVD, supple, symmetrical, trachea midline and thyroid not enlarged, symmetric, no tenderness/mass/nodules Back: symmetric, no curvature. ROM normal. No CVA tenderness. Lungs: clear to auscultation bilaterally Breasts: normal appearance, no masses or tenderness Heart: regular rate  and rhythm, S1, S2 normal, no murmur, click, rub or gallop Abdomen: soft, non-tender; bowel sounds normal; no masses,  no organomegaly Pelvic:  deferred Extremities: extremities normal, atraumatic, no cyanosis or edema Pulses: 2+ and symmetric Skin: Skin color, texture, turgor normal. No rashes or lesions Lymph nodes: Cervical, supraclavicular, and axillary nodes normal. Neurologic: Alert and oriented X 3, normal strength and tone. Normal symmetric reflexes. Normal coordination and gait Psych- no depression, no anxiety      Assessment:    Healthy female exam.      Plan:    ghm utd  Check labs See After Visit Summary for Counseling Recommendations

## 2015-07-31 NOTE — Patient Instructions (Addendum)
For acne--- try neutragena facial wash, or cetaphil,  You can also get otc oxy 10, clearasil etc-- be careful they will bleach sheets, towels and clothes     Preventive Care for Adults A healthy lifestyle and preventive care can promote health and wellness. Preventive health guidelines for women include the following key practices.  A routine yearly physical is a good way to check with your health care provider about your health and preventive screening. It is a chance to share any concerns and updates on your health and to receive a thorough exam.  Visit your dentist for a routine exam and preventive care every 6 months. Brush your teeth twice a day and floss once a day. Good oral hygiene prevents tooth decay and gum disease.  The frequency of eye exams is based on your age, health, family medical history, use of contact lenses, and other factors. Follow your health care provider's recommendations for frequency of eye exams.  Eat a healthy diet. Foods like vegetables, fruits, whole grains, low-fat dairy products, and lean protein foods contain the nutrients you need without too many calories. Decrease your intake of foods high in solid fats, added sugars, and salt. Eat the right amount of calories for you.Get information about a proper diet from your health care provider, if necessary.  Regular physical exercise is one of the most important things you can do for your health. Most adults should get at least 150 minutes of moderate-intensity exercise (any activity that increases your heart rate and causes you to sweat) each week. In addition, most adults need muscle-strengthening exercises on 2 or more days a week.  Maintain a healthy weight. The body mass index (BMI) is a screening tool to identify possible weight problems. It provides an estimate of body fat based on height and weight. Your health care provider can find your BMI and can help you achieve or maintain a healthy weight.For adults  20 years and older:  A BMI below 18.5 is considered underweight.  A BMI of 18.5 to 24.9 is normal.  A BMI of 25 to 29.9 is considered overweight.  A BMI of 30 and above is considered obese.  Maintain normal blood lipids and cholesterol levels by exercising and minimizing your intake of saturated fat. Eat a balanced diet with plenty of fruit and vegetables. Blood tests for lipids and cholesterol should begin at age 96 and be repeated every 5 years. If your lipid or cholesterol levels are high, you are over 50, or you are at high risk for heart disease, you may need your cholesterol levels checked more frequently.Ongoing high lipid and cholesterol levels should be treated with medicines if diet and exercise are not working.  If you smoke, find out from your health care provider how to quit. If you do not use tobacco, do not start.  Lung cancer screening is recommended for adults aged 32-80 years who are at high risk for developing lung cancer because of a history of smoking. A yearly low-dose CT scan of the lungs is recommended for people who have at least a 30-pack-year history of smoking and are a current smoker or have quit within the past 15 years. A pack year of smoking is smoking an average of 1 pack of cigarettes a day for 1 year (for example: 1 pack a day for 30 years or 2 packs a day for 15 years). Yearly screening should continue until the smoker has stopped smoking for at least 15 years. Yearly screening should  be stopped for people who develop a health problem that would prevent them from having lung cancer treatment.  If you are pregnant, do not drink alcohol. If you are breastfeeding, be very cautious about drinking alcohol. If you are not pregnant and choose to drink alcohol, do not have more than 1 drink per day. One drink is considered to be 12 ounces (355 mL) of beer, 5 ounces (148 mL) of wine, or 1.5 ounces (44 mL) of liquor.  Avoid use of street drugs. Do not share needles with  anyone. Ask for help if you need support or instructions about stopping the use of drugs.  High blood pressure causes heart disease and increases the risk of stroke. Your blood pressure should be checked at least every 1 to 2 years. Ongoing high blood pressure should be treated with medicines if weight loss and exercise do not work.  If you are 55-9 years old, ask your health care provider if you should take aspirin to prevent strokes.  Diabetes screening involves taking a blood sample to check your fasting blood sugar level. This should be done once every 3 years, after age 62, if you are within normal weight and without risk factors for diabetes. Testing should be considered at a younger age or be carried out more frequently if you are overweight and have at least 1 risk factor for diabetes.  Breast cancer screening is essential preventive care for women. You should practice "breast self-awareness." This means understanding the normal appearance and feel of your breasts and may include breast self-examination. Any changes detected, no matter how small, should be reported to a health care provider. Women in their 86s and 30s should have a clinical breast exam (CBE) by a health care provider as part of a regular health exam every 1 to 3 years. After age 45, women should have a CBE every year. Starting at age 82, women should consider having a mammogram (breast X-ray test) every year. Women who have a family history of breast cancer should talk to their health care provider about genetic screening. Women at a high risk of breast cancer should talk to their health care providers about having an MRI and a mammogram every year.  Breast cancer gene (BRCA)-related cancer risk assessment is recommended for women who have family members with BRCA-related cancers. BRCA-related cancers include breast, ovarian, tubal, and peritoneal cancers. Having family members with these cancers may be associated with an  increased risk for harmful changes (mutations) in the breast cancer genes BRCA1 and BRCA2. Results of the assessment will determine the need for genetic counseling and BRCA1 and BRCA2 testing.  Routine pelvic exams to screen for cancer are no longer recommended for nonpregnant women who are considered low risk for cancer of the pelvic organs (ovaries, uterus, and vagina) and who do not have symptoms. Ask your health care provider if a screening pelvic exam is right for you.  If you have had past treatment for cervical cancer or a condition that could lead to cancer, you need Pap tests and screening for cancer for at least 20 years after your treatment. If Pap tests have been discontinued, your risk factors (such as having a new sexual partner) need to be reassessed to determine if screening should be resumed. Some women have medical problems that increase the chance of getting cervical cancer. In these cases, your health care provider may recommend more frequent screening and Pap tests.  The HPV test is an additional test that may  be used for cervical cancer screening. The HPV test looks for the virus that can cause the cell changes on the cervix. The cells collected during the Pap test can be tested for HPV. The HPV test could be used to screen women aged 29 years and older, and should be used in women of any age who have unclear Pap test results. After the age of 49, women should have HPV testing at the same frequency as a Pap test.  Colorectal cancer can be detected and often prevented. Most routine colorectal cancer screening begins at the age of 11 years and continues through age 58 years. However, your health care provider may recommend screening at an earlier age if you have risk factors for colon cancer. On a yearly basis, your health care provider may provide home test kits to check for hidden blood in the stool. Use of a small camera at the end of a tube, to directly examine the colon  (sigmoidoscopy or colonoscopy), can detect the earliest forms of colorectal cancer. Talk to your health care provider about this at age 45, when routine screening begins. Direct exam of the colon should be repeated every 5-10 years through age 36 years, unless early forms of pre-cancerous polyps or small growths are found.  People who are at an increased risk for hepatitis B should be screened for this virus. You are considered at high risk for hepatitis B if:  You were born in a country where hepatitis B occurs often. Talk with your health care provider about which countries are considered high risk.  Your parents were born in a high-risk country and you have not received a shot to protect against hepatitis B (hepatitis B vaccine).  You have HIV or AIDS.  You use needles to inject street drugs.  You live with, or have sex with, someone who has hepatitis B.  You get hemodialysis treatment.  You take certain medicines for conditions like cancer, organ transplantation, and autoimmune conditions.  Hepatitis C blood testing is recommended for all people born from [birth year] through 1965 and any individual with known risks for hepatitis C.  Practice safe sex. Use condoms and avoid high-risk sexual practices to reduce the spread of sexually transmitted infections (STIs). STIs include gonorrhea, chlamydia, syphilis, trichomonas, herpes, HPV, and human immunodeficiency virus (HIV). Herpes, HIV, and HPV are viral illnesses that have no cure. They can result in disability, cancer, and death.  You should be screened for sexually transmitted illnesses (STIs) including gonorrhea and chlamydia if:  You are sexually active and are younger than 24 years.  You are older than 24 years and your health care provider tells you that you are at risk for this type of infection.  Your sexual activity has changed since you were last screened and you are at an increased risk for chlamydia or gonorrhea. Ask your health  care provider if you are at risk.  If you are at risk of being infected with HIV, it is recommended that you take a prescription medicine daily to prevent HIV infection. This is called preexposure prophylaxis (PrEP). You are considered at risk if:  You are a heterosexual woman, are sexually active, and are at increased risk for HIV infection.  You take drugs by injection.  You are sexually active with a partner who has HIV.  Talk with your health care provider about whether you are at high risk of being infected with HIV. If you choose to begin PrEP, you should first be tested  for HIV. You should then be tested every 3 months for as long as you are taking PrEP.  Osteoporosis is a disease in which the bones lose minerals and strength with aging. This can result in serious bone fractures or breaks. The risk of osteoporosis can be identified using a bone density scan. Women ages 34 years and over and women at risk for fractures or osteoporosis should discuss screening with their health care providers. Ask your health care provider whether you should take a calcium supplement or vitamin D to reduce the rate of osteoporosis.  Menopause can be associated with physical symptoms and risks. Hormone replacement therapy is available to decrease symptoms and risks. You should talk to your health care provider about whether hormone replacement therapy is right for you.  Use sunscreen. Apply sunscreen liberally and repeatedly throughout the day. You should seek shade when your shadow is shorter than you. Protect yourself by wearing long sleeves, pants, a wide-brimmed hat, and sunglasses year round, whenever you are outdoors.  Once a month, do a whole body skin exam, using a mirror to look at the skin on your back. Tell your health care provider of new moles, moles that have irregular borders, moles that are larger than a pencil eraser, or moles that have changed in shape or color.  Stay current with required  vaccines (immunizations).  Influenza vaccine. All adults should be immunized every year.  Tetanus, diphtheria, and acellular pertussis (Td, Tdap) vaccine. Pregnant women should receive 1 dose of Tdap vaccine during each pregnancy. The dose should be obtained regardless of the length of time since the last dose. Immunization is preferred during the 27th-36th week of gestation. An adult who has not previously received Tdap or who does not know her vaccine status should receive 1 dose of Tdap. This initial dose should be followed by tetanus and diphtheria toxoids (Td) booster doses every 10 years. Adults with an unknown or incomplete history of completing a 3-dose immunization series with Td-containing vaccines should begin or complete a primary immunization series including a Tdap dose. Adults should receive a Td booster every 10 years.  Varicella vaccine. An adult without evidence of immunity to varicella should receive 2 doses or a second dose if she has previously received 1 dose. Pregnant females who do not have evidence of immunity should receive the first dose after pregnancy. This first dose should be obtained before leaving the health care facility. The second dose should be obtained 4-8 weeks after the first dose.  Human papillomavirus (HPV) vaccine. Females aged 13-26 years who have not received the vaccine previously should obtain the 3-dose series. The vaccine is not recommended for use in pregnant females. However, pregnancy testing is not needed before receiving a dose. If a female is found to be pregnant after receiving a dose, no treatment is needed. In that case, the remaining doses should be delayed until after the pregnancy. Immunization is recommended for any person with an immunocompromised condition through the age of 23 years if she did not get any or all doses earlier. During the 3-dose series, the second dose should be obtained 4-8 weeks after the first dose. The third dose should be  obtained 24 weeks after the first dose and 16 weeks after the second dose.  Zoster vaccine. One dose is recommended for adults aged 48 years or older unless certain conditions are present.  Measles, mumps, and rubella (MMR) vaccine. Adults born before 65 generally are considered immune to measles and mumps.  Adults born in [birth year] or later should have 1 or more doses of MMR vaccine unless there is a contraindication to the vaccine or there is laboratory evidence of immunity to each of the three diseases. A routine second dose of MMR vaccine should be obtained at least 28 days after the first dose for students attending postsecondary schools, health care workers, or international travelers. People who received inactivated measles vaccine or an unknown type of measles vaccine during 1963-1967 should receive 2 doses of MMR vaccine. People who received inactivated mumps vaccine or an unknown type of mumps vaccine before 1979 and are at high risk for mumps infection should consider immunization with 2 doses of MMR vaccine. For females of childbearing age, rubella immunity should be determined. If there is no evidence of immunity, females who are not pregnant should be vaccinated. If there is no evidence of immunity, females who are pregnant should delay immunization until after pregnancy. Unvaccinated health care workers born before [birth year] who lack laboratory evidence of measles, mumps, or rubella immunity or laboratory confirmation of disease should consider measles and mumps immunization with 2 doses of MMR vaccine or rubella immunization with 1 dose of MMR vaccine.  Pneumococcal 13-valent conjugate (PCV13) vaccine. When indicated, a person who is uncertain of her immunization history and has no record of immunization should receive the PCV13 vaccine. An adult aged 76 years or older who has certain medical conditions and has not been previously immunized should receive 1 dose of PCV13 vaccine. This PCV13 should be  followed with a dose of pneumococcal polysaccharide (PPSV23) vaccine. The PPSV23 vaccine dose should be obtained at least 8 weeks after the dose of PCV13 vaccine. An adult aged 58 years or older who has certain medical conditions and previously received 1 or more doses of PPSV23 vaccine should receive 1 dose of PCV13. The PCV13 vaccine dose should be obtained 1 or more years after the last PPSV23 vaccine dose.  Pneumococcal polysaccharide (PPSV23) vaccine. When PCV13 is also indicated, PCV13 should be obtained first. All adults aged 66 years and older should be immunized. An adult younger than age 76 years who has certain medical conditions should be immunized. Any person who resides in a nursing home or long-term care facility should be immunized. An adult smoker should be immunized. People with an immunocompromised condition and certain other conditions should receive both PCV13 and PPSV23 vaccines. People with human immunodeficiency virus (HIV) infection should be immunized as soon as possible after diagnosis. Immunization during chemotherapy or radiation therapy should be avoided. Routine use of PPSV23 vaccine is not recommended for American Indians, [place] Natives, or people younger than 65 years unless there are medical conditions that require PPSV23 vaccine. When indicated, people who have unknown immunization and have no record of immunization should receive PPSV23 vaccine. One-time revaccination 5 years after the first dose of PPSV23 is recommended for people aged 19-64 years who have chronic kidney failure, nephrotic syndrome, asplenia, or immunocompromised conditions. People who received 1-2 doses of PPSV23 before age 60 years should receive another dose of PPSV23 vaccine at age 70 years or later if at least 5 years have passed since the previous dose. Doses of PPSV23 are not needed for people immunized with PPSV23 at or after age 41 years.  Meningococcal vaccine. Adults with asplenia or persistent  complement component deficiencies should receive 2 doses of quadrivalent meningococcal conjugate (MenACWY-D) vaccine. The doses should be obtained at least 2 months apart. Microbiologists working with certain meningococcal bacteria, [employer] recruits,  people at risk during an outbreak, and people who travel to or live in countries with a high rate of meningitis should be immunized. A first-year college student up through age 63 years who is living in a residence hall should receive a dose if she did not receive a dose on or after her 16th birthday. Adults who have certain high-risk conditions should receive one or more doses of vaccine.  Hepatitis A vaccine. Adults who wish to be protected from this disease, have certain high-risk conditions, work with hepatitis A-infected animals, work in hepatitis A research labs, or travel to or work in countries with a high rate of hepatitis A should be immunized. Adults who were previously unvaccinated and who anticipate close contact with an international adoptee during the first 60 days after arrival in the [place] from a country with a high rate of hepatitis A should be immunized.  Hepatitis B vaccine. Adults who wish to be protected from this disease, have certain high-risk conditions, may be exposed to blood or other infectious body fluids, are household contacts or sex partners of hepatitis B positive people, are clients or workers in certain care facilities, or travel to or work in countries with a high rate of hepatitis B should be immunized.  Haemophilus influenzae type b (Hib) vaccine. A previously unvaccinated person with asplenia or sickle cell disease or having a scheduled splenectomy should receive 1 dose of Hib vaccine. Regardless of previous immunization, a recipient of a hematopoietic stem cell transplant should receive a 3-dose series 6-12 months after her successful transplant. Hib vaccine is not recommended for adults with HIV  infection. Preventive Services / Frequency Ages 107 to 70 years  Blood pressure check.** / Every 1 to 2 years.  Lipid and cholesterol check.** / Every 5 years beginning at age 22.  Clinical breast exam.** / Every 3 years for women in their 34s and 16s.  BRCA-related cancer risk assessment.** / For women who have family members with a BRCA-related cancer (breast, ovarian, tubal, or peritoneal cancers).  Pap test.** / Every 2 years from ages 39 through 52. Every 3 years starting at age 15 through age 30 or 28 with a history of 3 consecutive normal Pap tests.  HPV screening.** / Every 3 years from ages 62 through ages 44 to 48 with a history of 3 consecutive normal Pap tests.  Hepatitis C blood test.** / For any individual with known risks for hepatitis C.  Skin self-exam. / Monthly.  Influenza vaccine. / Every year.  Tetanus, diphtheria, and acellular pertussis (Tdap, Td) vaccine.** / Consult your health care provider. Pregnant women should receive 1 dose of Tdap vaccine during each pregnancy. 1 dose of Td every 10 years.  Varicella vaccine.** / Consult your health care provider. Pregnant females who do not have evidence of immunity should receive the first dose after pregnancy.  HPV vaccine. / 3 doses over 6 months, if 86 and younger. The vaccine is not recommended for use in pregnant females. However, pregnancy testing is not needed before receiving a dose.  Measles, mumps, rubella (MMR) vaccine.** / You need at least 1 dose of MMR if you were born in 1957 or later. You may also need a 2nd dose. For females of childbearing age, rubella immunity should be determined. If there is no evidence of immunity, females who are not pregnant should be vaccinated. If there is no evidence of immunity, females who are pregnant should delay immunization until after pregnancy.  Pneumococcal 13-valent conjugate (  PCV13) vaccine.** / Consult your health care provider.  Pneumococcal polysaccharide  (PPSV23) vaccine.** / 1 to 2 doses if you smoke cigarettes or if you have certain conditions.  Meningococcal vaccine.** / 1 dose if you are age 56 to 42 years and a Market researcher living in a residence hall, or have one of several medical conditions, you need to get vaccinated against meningococcal disease. You may also need additional booster doses.  Hepatitis A vaccine.** / Consult your health care provider.  Hepatitis B vaccine.** / Consult your health care provider.  Haemophilus influenzae type b (Hib) vaccine.** / Consult your health care provider. Ages 63 to 22 years  Blood pressure check.** / Every 1 to 2 years.  Lipid and cholesterol check.** / Every 5 years beginning at age 22 years.  Lung cancer screening. / Every year if you are aged 49-80 years and have a 30-pack-year history of smoking and currently smoke or have quit within the past 15 years. Yearly screening is stopped once you have quit smoking for at least 15 years or develop a health problem that would prevent you from having lung cancer treatment.  Clinical breast exam.** / Every year after age 55 years.  BRCA-related cancer risk assessment.** / For women who have family members with a BRCA-related cancer (breast, ovarian, tubal, or peritoneal cancers).  Mammogram.** / Every year beginning at age 29 years and continuing for as long as you are in good health. Consult with your health care provider.  Pap test.** / Every 3 years starting at age 60 years through age 65 or 43 years with a history of 3 consecutive normal Pap tests.  HPV screening.** / Every 3 years from ages 55 years through ages 68 to 79 years with a history of 3 consecutive normal Pap tests.  Fecal occult blood test (FOBT) of stool. / Every year beginning at age 63 years and continuing until age 33 years. You may not need to do this test if you get a colonoscopy every 10 years.  Flexible sigmoidoscopy or colonoscopy.** / Every 5 years for a  flexible sigmoidoscopy or every 10 years for a colonoscopy beginning at age 59 years and continuing until age 51 years.  Hepatitis C blood test.** / For all people born from [birth year] through 1965 and any individual with known risks for hepatitis C.  Skin self-exam. / Monthly.  Influenza vaccine. / Every year.  Tetanus, diphtheria, and acellular pertussis (Tdap/Td) vaccine.** / Consult your health care provider. Pregnant women should receive 1 dose of Tdap vaccine during each pregnancy. 1 dose of Td every 10 years.  Varicella vaccine.** / Consult your health care provider. Pregnant females who do not have evidence of immunity should receive the first dose after pregnancy.  Zoster vaccine.** / 1 dose for adults aged 57 years or older.  Measles, mumps, rubella (MMR) vaccine.** / You need at least 1 dose of MMR if you were born in 1957 or later. You may also need a 2nd dose. For females of childbearing age, rubella immunity should be determined. If there is no evidence of immunity, females who are not pregnant should be vaccinated. If there is no evidence of immunity, females who are pregnant should delay immunization until after pregnancy.  Pneumococcal 13-valent conjugate (PCV13) vaccine.** / Consult your health care provider.  Pneumococcal polysaccharide (PPSV23) vaccine.** / 1 to 2 doses if you smoke cigarettes or if you have certain conditions.  Meningococcal vaccine.** / Consult your health care provider.  Hepatitis A  vaccine.** / Consult your health care provider.  Hepatitis B vaccine.** / Consult your health care provider.  Haemophilus influenzae type b (Hib) vaccine.** / Consult your health care provider. Ages 54 years and over  Blood pressure check.** / Every 1 to 2 years.  Lipid and cholesterol check.** / Every 5 years beginning at age 31 years.  Lung cancer screening. / Every year if you are aged 19-80 years and have a 30-pack-year history of smoking and currently smoke or have  quit within the past 15 years. Yearly screening is stopped once you have quit smoking for at least 15 years or develop a health problem that would prevent you from having lung cancer treatment.  Clinical breast exam.** / Every year after age 86 years.  BRCA-related cancer risk assessment.** / For women who have family members with a BRCA-related cancer (breast, ovarian, tubal, or peritoneal cancers).  Mammogram.** / Every year beginning at age 38 years and continuing for as long as you are in good health. Consult with your health care provider.  Pap test.** / Every 3 years starting at age 26 years through age 20 or 62 years with 3 consecutive normal Pap tests. Testing can be stopped between 65 and 70 years with 3 consecutive normal Pap tests and no abnormal Pap or HPV tests in the past 10 years.  HPV screening.** / Every 3 years from ages 52 years through ages 70 or 78 years with a history of 3 consecutive normal Pap tests. Testing can be stopped between 65 and 70 years with 3 consecutive normal Pap tests and no abnormal Pap or HPV tests in the past 10 years.  Fecal occult blood test (FOBT) of stool. / Every year beginning at age 81 years and continuing until age 69 years. You may not need to do this test if you get a colonoscopy every 10 years.  Flexible sigmoidoscopy or colonoscopy.** / Every 5 years for a flexible sigmoidoscopy or every 10 years for a colonoscopy beginning at age 35 years and continuing until age 60 years.  Hepatitis C blood test.** / For all people born from [birth year] through 1965 and any individual with known risks for hepatitis C.  Osteoporosis screening.** / A one-time screening for women ages 60 years and over and women at risk for fractures or osteoporosis.  Skin self-exam. / Monthly.  Influenza vaccine. / Every year.  Tetanus, diphtheria, and acellular pertussis (Tdap/Td) vaccine.** / 1 dose of Td every 10 years.  Varicella vaccine.** / Consult your health care  provider.  Zoster vaccine.** / 1 dose for adults aged 53 years or older.  Pneumococcal 13-valent conjugate (PCV13) vaccine.** / Consult your health care provider.  Pneumococcal polysaccharide (PPSV23) vaccine.** / 1 dose for all adults aged 68 years and older.  Meningococcal vaccine.** / Consult your health care provider.  Hepatitis A vaccine.** / Consult your health care provider.  Hepatitis B vaccine.** / Consult your health care provider.  Haemophilus influenzae type b (Hib) vaccine.** / Consult your health care provider. ** Family history and personal history of risk and conditions may change your health care provider's recommendations. Document Released: 02/08/2002 Document Revised: 04/29/2014 Document Reviewed: 05/10/2011 [facility] Patient Information 2015 [place], [place]. This information is not intended to replace advice given to you by your health care provider. Make sure you discuss any questions you have with your health care provider.

## 2015-08-28 ENCOUNTER — Other Ambulatory Visit: Payer: 59

## 2016-01-12 ENCOUNTER — Telehealth: Payer: Self-pay | Admitting: Family Medicine

## 2016-01-12 NOTE — Telephone Encounter (Signed)
Pt had flu vaccine at [place] in 09/2015

## 2016-01-12 NOTE — Telephone Encounter (Signed)
Updated. Pharmacy report documents 10/13/15.    KP

## 2016-07-02 ENCOUNTER — Telehealth: Payer: Self-pay | Admitting: Family Medicine

## 2016-07-02 NOTE — Telephone Encounter (Signed)
I sent this to charge correction. Please give patient a call and tell her to did regard the bill we have resubmitted with PA's supervising provider and insurance should cover.

## 2016-07-02 NOTE — Telephone Encounter (Signed)
Pt called in because she says that she received a bill for date of service 06/09/16 stating that provider seen is out of network. She says that she seen Percell Miller because her PCP didn't have any openings. She would like further assistance.  Thanks.   CB: [phone]

## 2016-07-14 NOTE — Telephone Encounter (Signed)
Called pt, made her aware. Pt is grateful.    Thanks for your assistance.

## 2016-12-06 ENCOUNTER — Encounter (HOSPITAL_BASED_OUTPATIENT_CLINIC_OR_DEPARTMENT_OTHER): Payer: Self-pay

## 2016-12-06 ENCOUNTER — Encounter: Payer: Self-pay | Admitting: Family Medicine

## 2016-12-06 ENCOUNTER — Ambulatory Visit (INDEPENDENT_AMBULATORY_CARE_PROVIDER_SITE_OTHER): Payer: 59 | Admitting: Family Medicine

## 2016-12-06 ENCOUNTER — Ambulatory Visit (HOSPITAL_BASED_OUTPATIENT_CLINIC_OR_DEPARTMENT_OTHER)
Admission: RE | Admit: 2016-12-06 | Discharge: 2016-12-06 | Disposition: A | Discharge status: Home / Self Care | Payer: 59 | Source: Ambulatory Visit | Attending: Family Medicine | Admitting: Family Medicine

## 2016-12-06 ENCOUNTER — Other Ambulatory Visit (HOSPITAL_COMMUNITY)
Admission: RE | Admit: 2016-12-06 | Discharge: 2016-12-06 | Disposition: A | Discharge status: Home / Self Care | Payer: 59 | Source: Ambulatory Visit | Attending: Family Medicine | Admitting: Family Medicine

## 2016-12-06 VITALS — BP 104/73 | HR 81 | Temp 98.5°F | Resp 16 | Ht 68.0 in | Wt 147.6 lb

## 2016-12-06 DIAGNOSIS — Z1231 Encounter for screening mammogram for malignant neoplasm of breast: Secondary | ICD-10-CM

## 2016-12-06 DIAGNOSIS — Z0000 Encounter for general adult medical examination without abnormal findings: Secondary | ICD-10-CM

## 2016-12-06 DIAGNOSIS — Z01419 Encounter for gynecological examination (general) (routine) without abnormal findings: Secondary | ICD-10-CM | POA: Diagnosis present

## 2016-12-06 DIAGNOSIS — R8290 Unspecified abnormal findings in urine: Secondary | ICD-10-CM

## 2016-12-06 DIAGNOSIS — Z30011 Encounter for initial prescription of contraceptive pills: Secondary | ICD-10-CM | POA: Diagnosis not present

## 2016-12-06 DIAGNOSIS — Z1151 Encounter for screening for human papillomavirus (HPV): Secondary | ICD-10-CM | POA: Diagnosis not present

## 2016-12-06 DIAGNOSIS — Z1239 Encounter for other screening for malignant neoplasm of breast: Secondary | ICD-10-CM

## 2016-12-06 LAB — COMPREHENSIVE METABOLIC PANEL
ALT: 14 U/L (ref 0–35)
AST: 17 U/L (ref 0–37)
Albumin: 4.4 g/dL (ref 3.5–5.2)
Alkaline Phosphatase: 68 U/L (ref 39–117)
BILIRUBIN TOTAL: 0.7 mg/dL (ref 0.2–1.2)
BUN: 12 mg/dL (ref 6–23)
CALCIUM: 9.4 mg/dL (ref 8.4–10.5)
CO2: 25 mEq/L (ref 19–32)
CREATININE: 0.81 mg/dL (ref 0.40–1.20)
Chloride: 103 mEq/L (ref 96–112)
GFR: 83.2 mL/min (ref >60.00)
Glucose, Bld: 90 mg/dL (ref 70–99)
Potassium: 3.5 mEq/L (ref 3.5–5.1)
Sodium: 138 mEq/L (ref 135–145)
TOTAL PROTEIN: 7.5 g/dL (ref 6.0–8.3)

## 2016-12-06 LAB — CBC WITH DIFFERENTIAL/PLATELET
BASOS ABS: 0 10*3/uL (ref 0.0–0.1)
Basophils Relative: 0.7 % (ref 0.0–3.0)
EOS ABS: 0 10*3/uL (ref 0.0–0.7)
Eosinophils Relative: 0.8 % (ref 0.0–5.0)
HCT: 36.2 % (ref 36.0–46.0)
Hemoglobin: 11.8 g/dL — ABNORMAL LOW (ref 12.0–15.0)
LYMPHS ABS: 2.6 10*3/uL (ref 0.7–4.0)
Lymphocytes Relative: 48.9 % — ABNORMAL HIGH (ref 12.0–46.0)
MCHC: 32.5 g/dL (ref 30.0–36.0)
MCV: 83.6 fl (ref 78.0–100.0)
MONO ABS: 0.4 10*3/uL (ref 0.1–1.0)
Monocytes Relative: 6.7 % (ref 3.0–12.0)
NEUTROS PCT: 42.9 % — AB (ref 43.0–77.0)
Neutro Abs: 2.2 10*3/uL (ref 1.4–7.7)
Platelets: 392 10*3/uL (ref 150.0–400.0)
RBC: 4.32 Mil/uL (ref 3.87–5.11)
RDW: 14.6 % (ref 11.5–15.5)
WBC: 5.3 10*3/uL (ref 4.0–10.5)

## 2016-12-06 LAB — POCT URINALYSIS DIPSTICK
Bilirubin, UA: NEGATIVE
GLUCOSE UA: NEGATIVE
KETONES UA: NEGATIVE
Leukocytes, UA: NEGATIVE
Nitrite, UA: NEGATIVE
Protein, UA: NEGATIVE
Spec Grav, UA: 1.025
Urobilinogen, UA: NEGATIVE
pH, UA: 6

## 2016-12-06 LAB — LIPID PANEL
CHOLESTEROL: 183 mg/dL (ref 0–200)
HDL: 69.9 mg/dL (ref >39.00)
LDL CALC: 94 mg/dL (ref 0–99)
NonHDL: 112.76
TRIGLYCERIDES: 95 mg/dL (ref 0.0–149.0)
Total CHOL/HDL Ratio: 3
VLDL: 19 mg/dL (ref 0.0–40.0)

## 2016-12-06 LAB — TSH: TSH: 0.52 u[IU]/mL (ref 0.35–4.50)

## 2016-12-06 MED ORDER — NORGESTIM-ETH ESTRAD TRIPHASIC 0.18/0.215/0.25 MG-35 MCG PO TABS: 1.0000 | ORAL_TABLET | Freq: Every day | ORAL | 3 refills | Status: DC

## 2016-12-06 NOTE — Progress Notes (Signed)
Pre visit review using our clinic review tool, if applicable. No additional management support is needed unless otherwise documented below in the visit note. 

## 2016-12-06 NOTE — Patient Instructions (Signed)
Preventive Care 18-39 Years, Female Preventive care refers to lifestyle choices and visits with your health care provider that can promote health and wellness. What does preventive care include?  A yearly physical exam. This is also called an annual well check.  Dental exams once or twice a year.  Routine eye exams. Ask your health care provider how often you should have your eyes checked.  Personal lifestyle choices, including:  Daily care of your teeth and gums.  Regular physical activity.  Eating a healthy diet.  Avoiding tobacco and drug use.  Limiting alcohol use.  Practicing safe sex.  Taking vitamin and mineral supplements as recommended by your health care provider. What happens during an annual well check? The services and screenings done by your health care provider during your annual well check will depend on your age, overall health, lifestyle risk factors, and family history of disease. Counseling  Your health care provider may ask you questions about your:  Alcohol use.  Tobacco use.  Drug use.  Emotional well-being.  Home and relationship well-being.  Sexual activity.  Eating habits.  Work and work environment.  Method of birth control.  Menstrual cycle.  Pregnancy history. Screening  You may have the following tests or measurements:  Height, weight, and BMI.  Diabetes screening. This is done by checking your blood sugar (glucose) after you have not eaten for a while (fasting).  Blood pressure.  Lipid and cholesterol levels. These may be checked every 5 years starting at age 20.  Skin check.  Hepatitis C blood test.  Hepatitis B blood test.  Sexually transmitted disease (STD) testing.  BRCA-related cancer screening. This may be done if you have a family history of breast, ovarian, tubal, or peritoneal cancers.  Pelvic exam and Pap test. This may be done every 3 years starting at age 21. Starting at age 30, this may be done every 5  years if you have a Pap test in combination with an HPV test. Discuss your test results, treatment options, and if necessary, the need for more tests with your health care provider. Vaccines  Your health care provider may recommend certain vaccines, such as:  Influenza vaccine. This is recommended every year.  Tetanus, diphtheria, and acellular pertussis (Tdap, Td) vaccine. You may need a Td booster every 10 years.  Varicella vaccine. You may need this if you have not been vaccinated.  HPV vaccine. If you are 26 or younger, you may need three doses over 6 months.  Measles, mumps, and rubella (MMR) vaccine. You may need at least one dose of MMR. You may also need a second dose.  Pneumococcal 13-valent conjugate (PCV13) vaccine. You may need this if you have certain conditions and were not previously vaccinated.  Pneumococcal polysaccharide (PPSV23) vaccine. You may need one or two doses if you smoke cigarettes or if you have certain conditions.  Meningococcal vaccine. One dose is recommended if you are age 19-21 years and a first-year college student living in a residence hall, or if you have one of several medical conditions. You may also need additional booster doses.  Hepatitis A vaccine. You may need this if you have certain conditions or if you travel or work in places where you may be exposed to hepatitis A.  Hepatitis B vaccine. You may need this if you have certain conditions or if you travel or work in places where you may be exposed to hepatitis B.  Haemophilus influenzae type b (Hib) vaccine. You may need this   if you have certain risk factors. Talk to your health care provider about which screenings and vaccines you need and how often you need them. This information is not intended to replace advice given to you by your health care provider. Make sure you discuss any questions you have with your health care provider. Document Released: 02/08/2002 Document Revised: 09/01/2016  Document Reviewed: 10/14/2015 Elsevier Interactive Patient Education  2017 [employer].

## 2016-12-06 NOTE — Progress Notes (Signed)
Subjective:     Jeanne Davis is a 40 y.o. female and is here for a comprehensive physical exam. The patient reports problems - uri symptoms but they are improving.  Social History   Social History  . Marital status: Single    Spouse name: N/A  . Number of children: N/A  . Years of education: N/A   Occupational History  . dept [place] History Main Topics  . Smoking status: Never Smoker  . Smokeless tobacco: Never Used  . Alcohol use No  . Drug use: No  . Sexual activity: No   Other Topics Concern  . Not on file   Social History Narrative   Exercise --- no   Health Maintenance  Topic Date Due  . PAP SMEAR  07/10/2017  . TETANUS/TDAP  07/06/2020  . INFLUENZA VACCINE  Completed  . HIV Screening  Completed    The following portions of the patient's history were reviewed and updated as appropriate: She  has no past medical history on file. She  does not have any pertinent problems on file. She  has a past surgical history that includes Wisdom tooth extraction. Her family history includes Breast cancer in her maternal aunt; Cancer in her maternal aunt, maternal aunt, maternal uncle, and paternal aunt; Coronary artery disease in her father; Diabetes in her father, maternal grandmother, and sister; Heart attack in her maternal aunt; Heart disease in her maternal grandmother; Hyperlipidemia in her father and mother; Hypertension in her father. She  reports that she has never smoked. She has never used smokeless tobacco. She reports that she does not drink alcohol or use drugs. She has a current medication list which includes the following prescription(s): norgestimate-ethinyl estradiol triphasic. No current outpatient prescriptions on file prior to visit.   No current facility-administered medications on file prior to visit.    She has No Known Allergies..  Review of Systems Review of Systems  Constitutional: Negative for activity change, appetite change  and fatigue.  HENT: Negative for hearing loss, congestion, tinnitus and ear discharge.  dentist q33m Eyes: Negative for visual disturbance (see optho q1y -- vision corrected to 20/20 with glasses).  Respiratory: Negative for cough, chest tightness and shortness of breath.   Cardiovascular: Negative for chest pain, palpitations and leg swelling.  Gastrointestinal: Negative for abdominal pain, diarrhea, constipation and abdominal distention.  Genitourinary: Negative for urgency, frequency, decreased urine volume and difficulty urinating.  Musculoskeletal: Negative for back pain, arthralgias and gait problem.  Skin: Negative for color change, pallor and rash.  Neurological: Negative for dizziness, light-headedness, numbness and headaches.  Hematological: Negative for adenopathy. Does not bruise/bleed easily.  Psychiatric/Behavioral: Negative for suicidal ideas, confusion, sleep disturbance, self-injury, dysphoric mood, decreased concentration and agitation.       Objective:    BP 104/73 (BP Location: Left Arm, Patient Position: Sitting, Cuff Size: Normal)   Pulse 81   Temp 98.5 F (36.9 C) (Oral)   Resp 16   Ht 5\' 8"  (1.727 m)   Wt 147 lb 9.6 oz (67 kg)   LMP 11/26/2016 (Exact Date)   SpO2 100%   BMI 22.44 kg/m  General appearance: alert, cooperative, appears stated age and no distress Head: Normocephalic, without obvious abnormality, atraumatic Eyes: conjunctivae/corneas clear. PERRL, EOM's intact. Fundi benign. Ears: normal TM's and external ear canals both ears Nose: Nares normal. Septum midline. Mucosa normal. No drainage or sinus tenderness. Throat: lips, mucosa, and tongue normal; teeth and gums normal Neck: no  adenopathy, no carotid bruit, no JVD, supple, symmetrical, trachea midline and thyroid not enlarged, symmetric, no tenderness/mass/nodules Back: symmetric, no curvature. ROM normal. No CVA tenderness. Lungs: clear to auscultation bilaterally Breasts: normal appearance,  no masses or tenderness Heart: regular rate and rhythm, S1, S2 normal, no murmur, click, rub or gallop Abdomen: soft, non-tender; bowel sounds normal; no masses,  no organomegaly Pelvic: cervix normal in appearance, external genitalia normal, no adnexal masses or tenderness, no cervical motion tenderness, rectovaginal septum normal, uterus normal size, shape, and consistency, vagina normal without discharge and rectal heme neg brown stool Extremities: cervix normal in appearance, external genitalia normal, no adnexal masses or tenderness, no cervical motion tenderness, rectovaginal septum normal, uterus normal size, shape, and consistency, vagina normal without discharge and pap done Pulses: 2+ and symmetric Skin: Skin color, texture, turgor normal. No rashes or lesions Lymph nodes: Cervical, supraclavicular, and axillary nodes normal. Neurologic: Alert and oriented X 3, normal strength and tone. Normal symmetric reflexes. Normal coordination and gait    Assessment:    Healthy female exam.      Plan:    ghm utd Check labs See After Visit Summary for Counseling Recommendations    1. Preventative health care See above - CBC with Differential/Platelet - Lipid panel - POCT urinalysis dipstick - TSH - Comprehensive metabolic panel - Cytology - PAP  2. Encounter for initial prescription of contraceptive pills   - Norgestimate-Ethinyl Estradiol Triphasic 0.18/0.215/0.25 MG-35 MCG tablet; Take 1 tablet by mouth daily.  Dispense: 3 Package; Refill: 3 - Cytology - PAP  3. Breast cancer screening   - MM SCREENING BREAST TOMO BILATERAL; Future  4. Abnormal urine   - Urine Culture

## 2016-12-07 LAB — URINE CULTURE: Organism ID, Bacteria: NO GROWTH

## 2016-12-08 LAB — CYTOLOGY - PAP
DIAGNOSIS: NEGATIVE
HPV: NOT DETECTED

## 2017-12-08 ENCOUNTER — Encounter: Payer: Self-pay | Admitting: Family Medicine

## 2017-12-08 ENCOUNTER — Other Ambulatory Visit: Payer: Self-pay | Admitting: Family Medicine

## 2017-12-08 ENCOUNTER — Ambulatory Visit (INDEPENDENT_AMBULATORY_CARE_PROVIDER_SITE_OTHER): Payer: 59 | Admitting: Family Medicine

## 2017-12-08 ENCOUNTER — Ambulatory Visit (HOSPITAL_BASED_OUTPATIENT_CLINIC_OR_DEPARTMENT_OTHER)
Admission: RE | Admit: 2017-12-08 | Discharge: 2017-12-08 | Disposition: A | Discharge status: Home / Self Care | Payer: 59 | Source: Ambulatory Visit | Attending: Family Medicine | Admitting: Family Medicine

## 2017-12-08 VITALS — BP 102/68 | HR 66 | Temp 98.2°F | Ht 68.0 in | Wt 150.0 lb

## 2017-12-08 DIAGNOSIS — Z0000 Encounter for general adult medical examination without abnormal findings: Secondary | ICD-10-CM

## 2017-12-08 DIAGNOSIS — Z1231 Encounter for screening mammogram for malignant neoplasm of breast: Secondary | ICD-10-CM

## 2017-12-08 LAB — COMPREHENSIVE METABOLIC PANEL
ALK PHOS: 65 U/L (ref 39–117)
ALT: 8 U/L (ref 0–35)
AST: 11 U/L (ref 0–37)
Albumin: 4.3 g/dL (ref 3.5–5.2)
BUN: 10 mg/dL (ref 6–23)
CHLORIDE: 105 meq/L (ref 96–112)
CO2: 24 meq/L (ref 19–32)
Calcium: 9.1 mg/dL (ref 8.4–10.5)
Creatinine, Ser: 0.82 mg/dL (ref 0.40–1.20)
GFR: 98.76 mL/min (ref >60.00)
GLUCOSE: 93 mg/dL (ref 70–99)
POTASSIUM: 4 meq/L (ref 3.5–5.1)
SODIUM: 139 meq/L (ref 135–145)
TOTAL PROTEIN: 7.2 g/dL (ref 6.0–8.3)
Total Bilirubin: 0.4 mg/dL (ref 0.2–1.2)

## 2017-12-08 LAB — LIPID PANEL
CHOL/HDL RATIO: 2
Cholesterol: 173 mg/dL (ref 0–200)
HDL: 78.5 mg/dL (ref >39.00)
LDL Cholesterol: 76 mg/dL (ref 0–99)
NONHDL: 94.11
Triglycerides: 90 mg/dL (ref 0.0–149.0)
VLDL: 18 mg/dL (ref 0.0–40.0)

## 2017-12-08 LAB — CBC WITH DIFFERENTIAL/PLATELET
Basophils Absolute: 0 10*3/uL (ref 0.0–0.1)
Basophils Relative: 0.5 % (ref 0.0–3.0)
Eosinophils Absolute: 0 10*3/uL (ref 0.0–0.7)
Eosinophils Relative: 0.8 % (ref 0.0–5.0)
HCT: 34.6 % — ABNORMAL LOW (ref 36.0–46.0)
Hemoglobin: 10.9 g/dL — ABNORMAL LOW (ref 12.0–15.0)
LYMPHS ABS: 2.2 10*3/uL (ref 0.7–4.0)
LYMPHS PCT: 35.4 % (ref 12.0–46.0)
MCHC: 31.4 g/dL (ref 30.0–36.0)
MCV: 84.9 fl (ref 78.0–100.0)
MONO ABS: 0.4 10*3/uL (ref 0.1–1.0)
MONOS PCT: 7 % (ref 3.0–12.0)
NEUTROS PCT: 56.3 % (ref 43.0–77.0)
Neutro Abs: 3.5 10*3/uL (ref 1.4–7.7)
Platelets: 368 10*3/uL (ref 150.0–400.0)
RBC: 4.07 Mil/uL (ref 3.87–5.11)
RDW: 14.9 % (ref 11.5–15.5)
WBC: 6.2 10*3/uL (ref 4.0–10.5)

## 2017-12-08 LAB — POC URINALSYSI DIPSTICK (AUTOMATED)
BILIRUBIN UA: NEGATIVE
Blood, UA: NEGATIVE
GLUCOSE UA: NEGATIVE
KETONES UA: NEGATIVE
LEUKOCYTES UA: NEGATIVE
Nitrite, UA: NEGATIVE
Protein, UA: NEGATIVE
Spec Grav, UA: >=1.03 — AB (ref 1.010–1.025)
Urobilinogen, UA: 0.2 E.U./dL
pH, UA: 6 (ref 5.0–8.0)

## 2017-12-08 LAB — TSH: TSH: 0.91 u[IU]/mL (ref 0.35–4.50)

## 2017-12-08 IMAGING — MG 2D DIGITAL SCREENING BILATERAL MAMMOGRAM WITH CAD AND ADJUNCT TO
8 series · 9 of 24 positions shown · non-contrast
Comparison: Previous exam(s).

CLINICAL DATA: Screening.

EXAM:
2D DIGITAL SCREENING BILATERAL MAMMOGRAM WITH CAD AND ADJUNCT TOMO

[R CC]
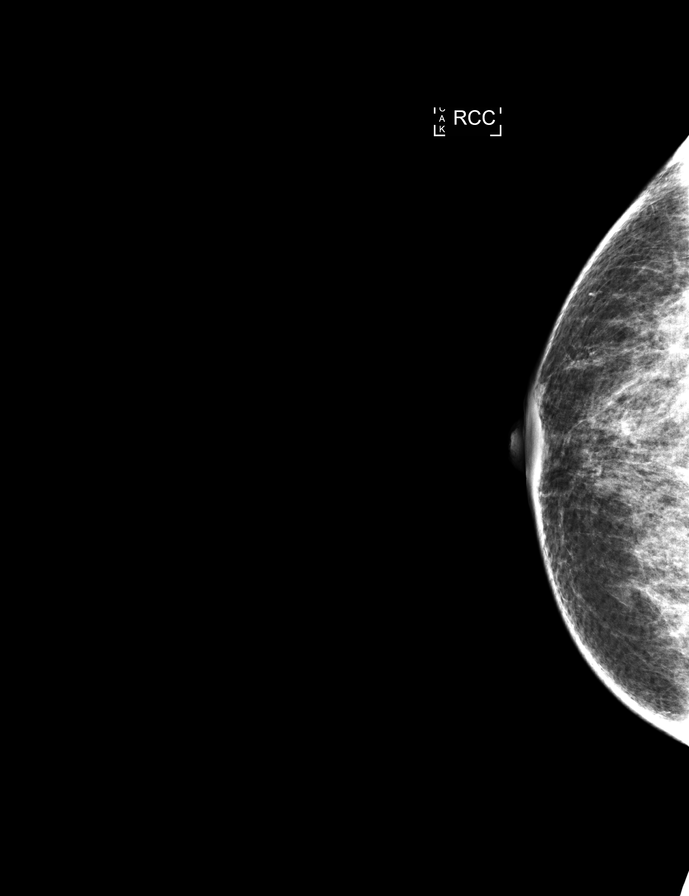

[L CC]
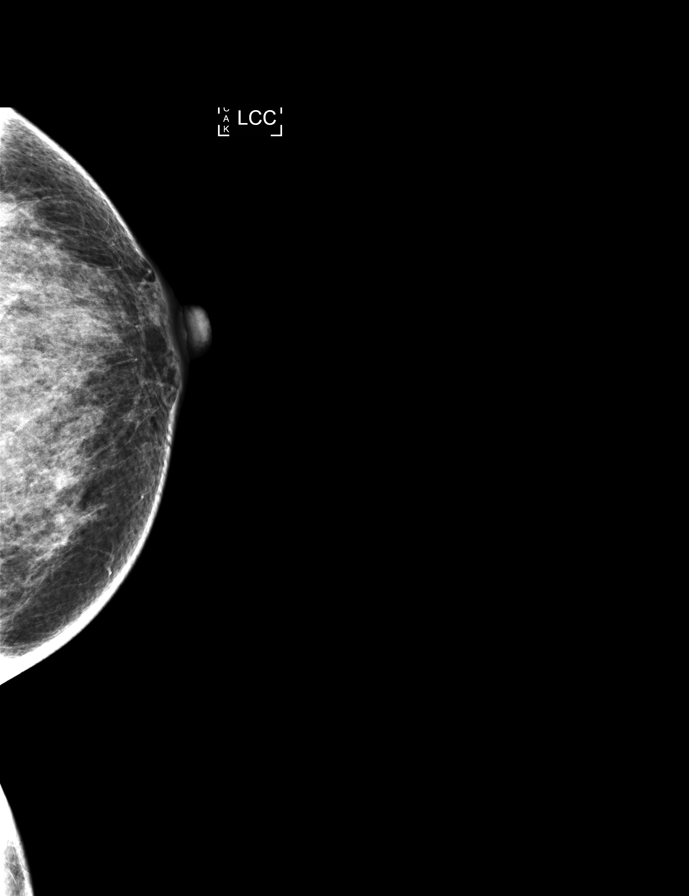

[L MLO]
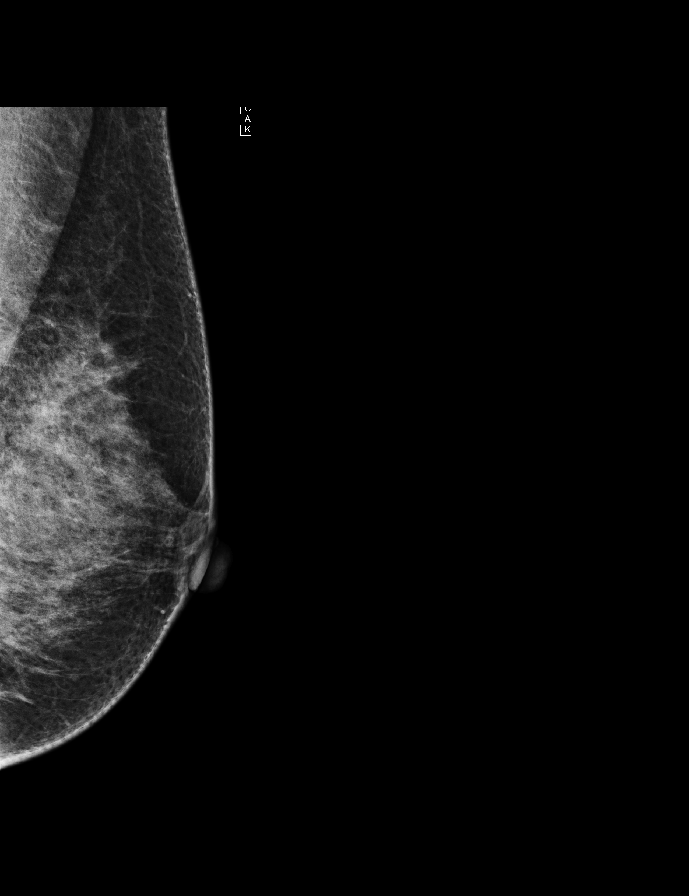

[R MLO]
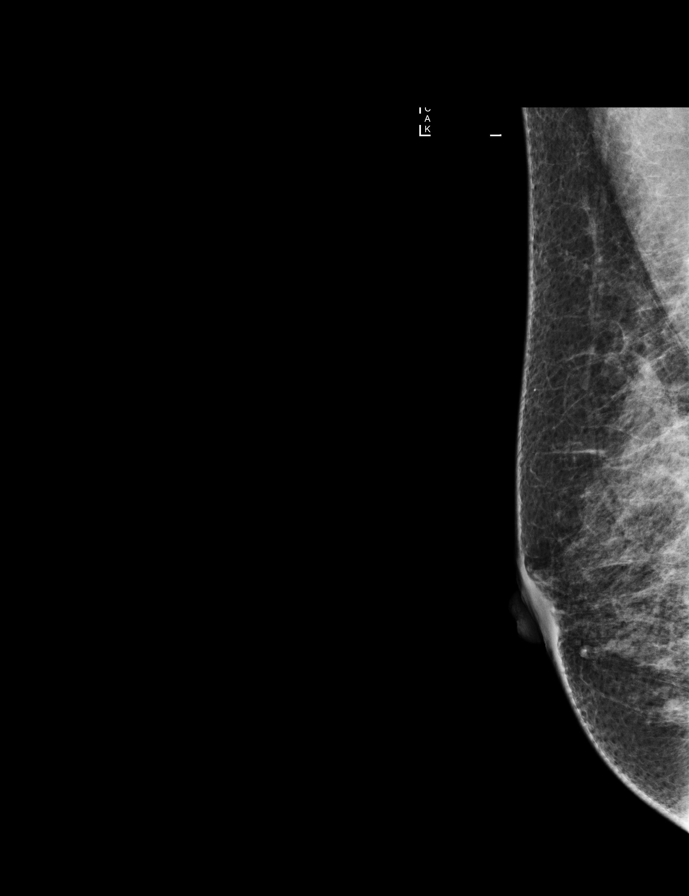

[L CC tomo · 2 of 72 frames shown]
[frame 24/72]
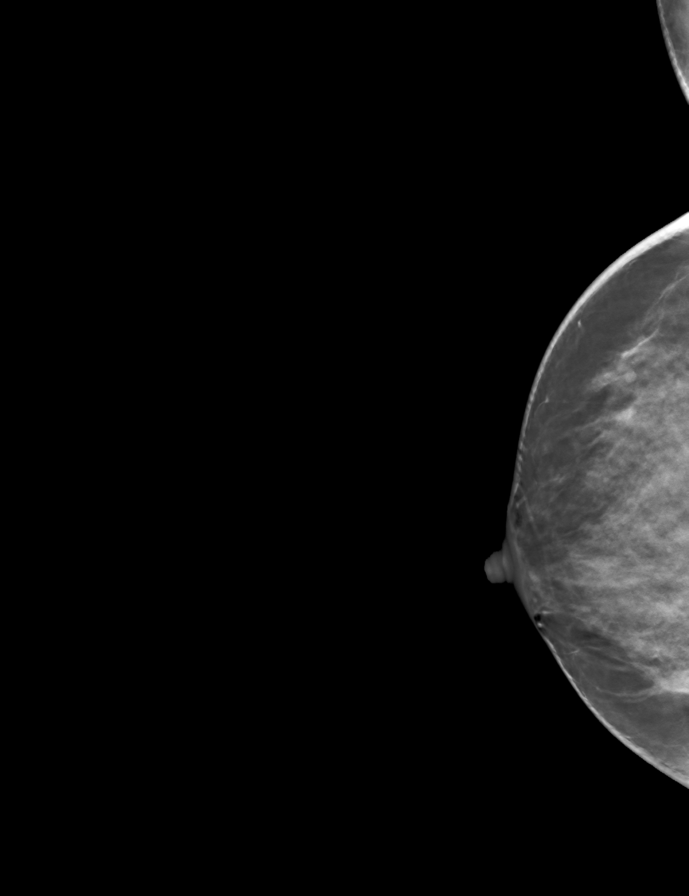
[frame 37/72]
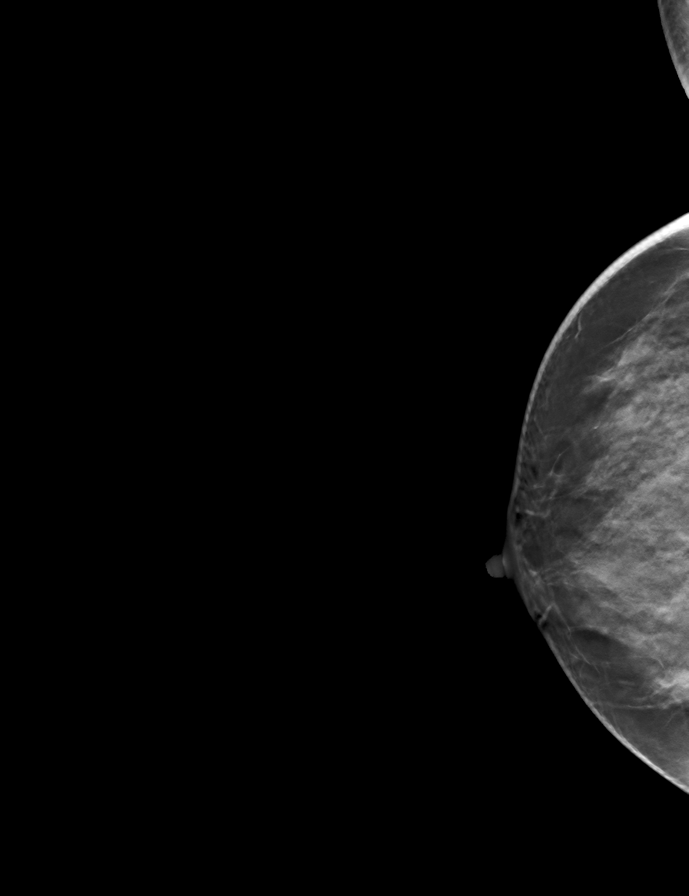

[L MLO tomo · tomo slice 36/71.0]
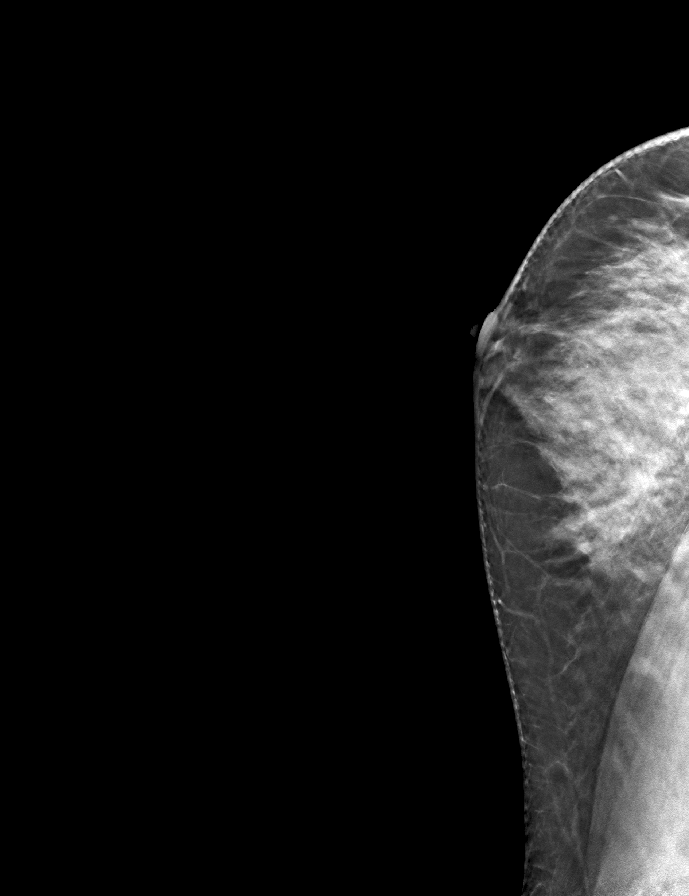

[R CC tomo · tomo slice 35/69.0]
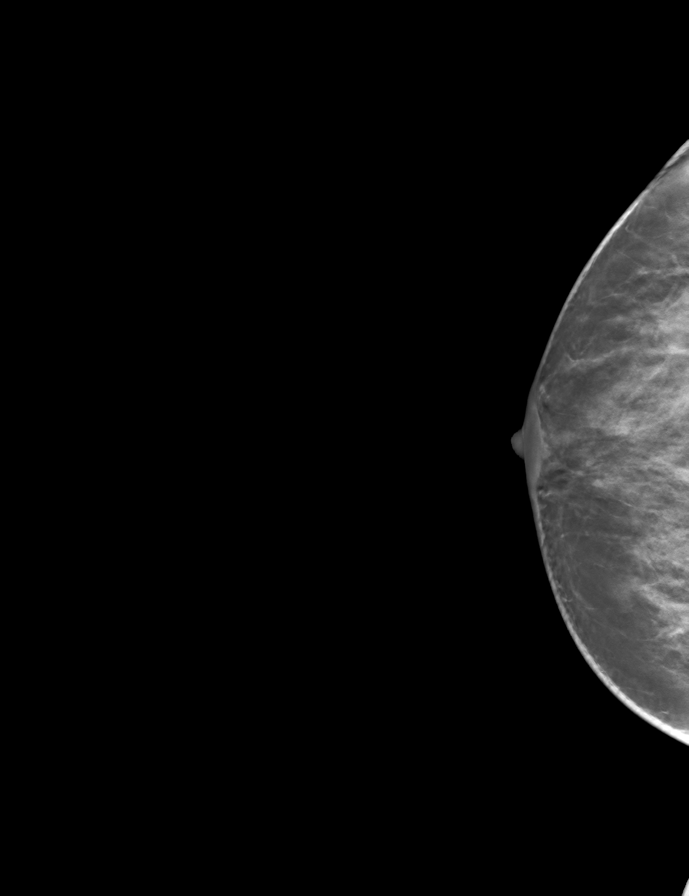

[R MLO tomo · tomo slice 35/69.0]
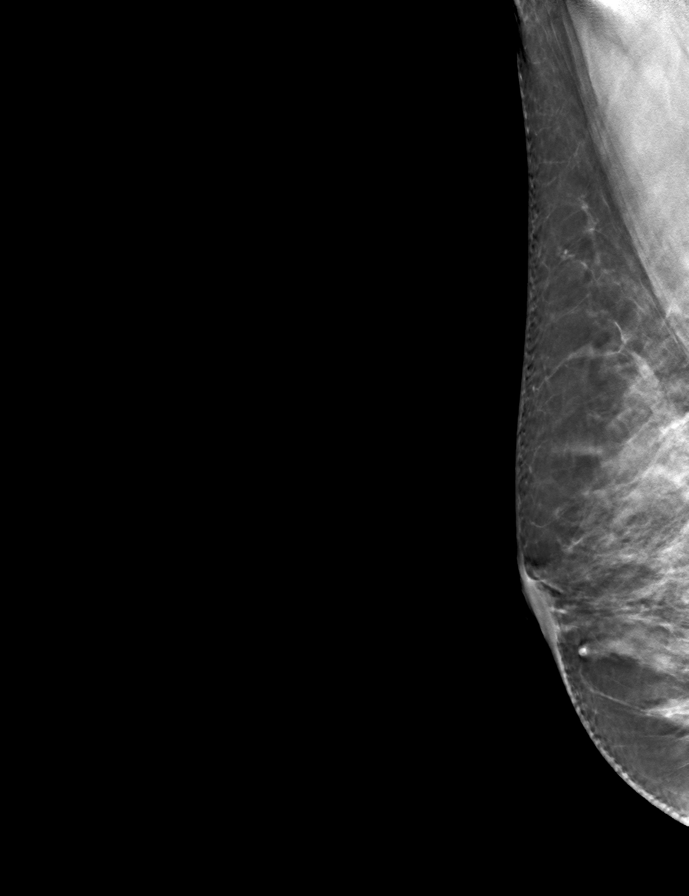

[9 of 24 positions shown; findings below may reference images not displayed]

ACR Breast Density Category c: The breast tissue is heterogeneously
dense, which may obscure small masses.
FINDINGS: There are no findings suspicious for malignancy. Images were
processed with CAD.
IMPRESSION: No mammographic evidence of malignancy. A result letter of this
screening mammogram will be mailed directly to the patient.

RECOMMENDATION:
Screening mammogram in one year. (Code:[TA])

BI-RADS CATEGORY  1: Negative.

## 2017-12-08 NOTE — Patient Instructions (Signed)
Preventive Care 40-64 Years, Female Preventive care refers to lifestyle choices and visits with your health care provider that can promote health and wellness. What does preventive care include?  A yearly physical exam. This is also called an annual well check.  Dental exams once or twice a year.  Routine eye exams. Ask your health care provider how often you should have your eyes checked.  Personal lifestyle choices, including: ? Daily care of your teeth and gums. ? Regular physical activity. ? Eating a healthy diet. ? Avoiding tobacco and drug use. ? Limiting alcohol use. ? Practicing safe sex. ? Taking low-dose aspirin daily starting at age 58. ? Taking vitamin and mineral supplements as recommended by your health care provider. What happens during an annual well check? The services and screenings done by your health care provider during your annual well check will depend on your age, overall health, lifestyle risk factors, and family history of disease. Counseling Your health care provider may ask you questions about your:  Alcohol use.  Tobacco use.  Drug use.  Emotional well-being.  Home and relationship well-being.  Sexual activity.  Eating habits.  Work and work Statistician.  Method of birth control.  Menstrual cycle.  Pregnancy history.  Screening You may have the following tests or measurements:  Height, weight, and BMI.  Blood pressure.  Lipid and cholesterol levels. These may be checked every 5 years, or more frequently if you are over 81 years old.  Skin check.  Lung cancer screening. You may have this screening every year starting at age 78 if you have a 30-pack-year history of smoking and currently smoke or have quit within the past 15 years.  Fecal occult blood test (FOBT) of the stool. You may have this test every year starting at age 65.  Flexible sigmoidoscopy or colonoscopy. You may have a sigmoidoscopy every 5 years or a colonoscopy  every 10 years starting at age 30.  Hepatitis C blood test.  Hepatitis B blood test.  Sexually transmitted disease (STD) testing.  Diabetes screening. This is done by checking your blood sugar (glucose) after you have not eaten for a while (fasting). You may have this done every 1-3 years.  Mammogram. This may be done every 1-2 years. Talk to your health care provider about when you should start having regular mammograms. This may depend on whether you have a family history of breast cancer.  BRCA-related cancer screening. This may be done if you have a family history of breast, ovarian, tubal, or peritoneal cancers.  Pelvic exam and Pap test. This may be done every 3 years starting at age 80. Starting at age 36, this may be done every 5 years if you have a Pap test in combination with an HPV test.  Bone density scan. This is done to screen for osteoporosis. You may have this scan if you are at high risk for osteoporosis.  Discuss your test results, treatment options, and if necessary, the need for more tests with your health care provider. Vaccines Your health care provider may recommend certain vaccines, such as:  Influenza vaccine. This is recommended every year.  Tetanus, diphtheria, and acellular pertussis (Tdap, Td) vaccine. You may need a Td booster every 10 years.  Varicella vaccine. You may need this if you have not been vaccinated.  Zoster vaccine. You may need this after age 5.  Measles, mumps, and rubella (MMR) vaccine. You may need at least one dose of MMR if you were born in  1957 or later. You may also need a second dose.  Pneumococcal 13-valent conjugate (PCV13) vaccine. You may need this if you have certain conditions and were not previously vaccinated.  Pneumococcal polysaccharide (PPSV23) vaccine. You may need one or two doses if you smoke cigarettes or if you have certain conditions.  Meningococcal vaccine. You may need this if you have certain  conditions.  Hepatitis A vaccine. You may need this if you have certain conditions or if you travel or work in places where you may be exposed to hepatitis A.  Hepatitis B vaccine. You may need this if you have certain conditions or if you travel or work in places where you may be exposed to hepatitis B.  Haemophilus influenzae type b (Hib) vaccine. You may need this if you have certain conditions.  Talk to your health care provider about which screenings and vaccines you need and how often you need them. This information is not intended to replace advice given to you by your health care provider. Make sure you discuss any questions you have with your health care provider. Document Released: 01/09/2016 Document Revised: 09/01/2016 Document Reviewed: 10/14/2015 Elsevier Interactive Patient Education  2017 [employer].

## 2017-12-08 NOTE — Assessment & Plan Note (Signed)
ghm utd Check labs  See avs  

## 2017-12-08 NOTE — Progress Notes (Signed)
Subjective:  I acted as a Education administrator for [employer], [place]   Patient ID: Jeanne Davis, female    DOB: 1976/05/23, 41 y.o.   MRN: [id number]  Chief Complaint  Patient presents with  . Annual Exam    HPI  Patient is in today for her annual exam   No pap  Patient Care Team: Carollee Herter, Alferd Apa, DO as PCP - General   History reviewed. No pertinent past medical history.  Past Surgical History:  Procedure Laterality Date  . WISDOM TOOTH EXTRACTION      Family History  Problem Relation Age of Onset  . Coronary artery disease Father   . Hyperlipidemia Father   . Diabetes Father   . Hypertension Father   . Hyperlipidemia Mother   . Diabetes Sister        type I---half sister  . Diabetes Maternal Grandmother   . Heart disease Maternal Grandmother        CHF  . Breast cancer Maternal Aunt   . Cancer Maternal Aunt        breast ca  . Heart attack Maternal Aunt   . Cancer Maternal Aunt        fallopian tubes  . Cancer Maternal Uncle        tumor kidney, colon cancer  . Cancer Paternal Aunt         breast    Social History   Socioeconomic History  . Marital status: Single    Spouse name: Not on file  . Number of children: Not on file  . Years of education: Not on file  . Highest education level: Not on file  Social Needs  . Financial resource strain: Not on file  . Food insecurity - worry: Not on file  . Food insecurity - inability: Not on file  . Transportation needs - medical: Not on file  . Transportation needs - non-medical: Not on file  Occupational History  . 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[employer]  Tobacco Use  . Smoking status: Never Smoker  . Smokeless tobacco: Never Used  Substance and Sexual Activity  . Alcohol use: No  . Drug use: No  . Sexual activity: No    Partners: Male  Other Topics Concern  . Not on file  Social History Narrative   Exercise --- no    Outpatient Medications Prior to Visit  Medication Sig Dispense Refill  .  Norgestimate-Ethinyl Estradiol Triphasic 0.18/0.215/0.25 MG-35 MCG tablet Take 1 tablet by mouth daily. (Patient not taking: Reported on 12/08/2017) 3 Package 3   No facility-administered medications prior to visit.     No Known Allergies  Review of Systems  Constitutional: Negative for chills, fever and malaise/fatigue.  HENT: Negative for congestion and hearing loss.   Eyes: Negative for blurred vision and discharge.  Respiratory: Negative for cough, sputum production and shortness of breath.   Cardiovascular: Negative for chest pain, palpitations and leg swelling.  Gastrointestinal: Negative for abdominal pain, blood in stool, constipation, diarrhea, heartburn, nausea and vomiting.  Genitourinary: Negative for dysuria, frequency, hematuria and urgency.  Musculoskeletal: Negative for back pain, falls and myalgias.  Skin: Negative for rash.  Neurological: Negative for dizziness, sensory change, loss of consciousness, weakness and headaches.  Endo/Heme/Allergies: Negative for environmental allergies. Does not bruise/bleed easily.  Psychiatric/Behavioral: Negative for depression and suicidal ideas. The patient is not nervous/anxious and does not have insomnia.        Objective:    Physical Exam  Constitutional: She  is oriented to person, place, and time. She appears well-developed and well-nourished. No distress.  HENT:  Head: Normocephalic and atraumatic.  Right Ear: External ear normal.  Left Ear: External ear normal.  Nose: Nose normal.  Mouth/Throat: Oropharynx is clear and moist.  Eyes: Conjunctivae and EOM are normal. Pupils are equal, round, and reactive to light. Right eye exhibits no discharge. Left eye exhibits no discharge.  Neck: Normal range of motion. Neck supple. No JVD present. No thyromegaly present.  Cardiovascular: Normal rate, regular rhythm, normal heart sounds and intact distal pulses.  No murmur heard. Pulmonary/Chest: Effort normal and breath sounds normal.  No respiratory distress. She has no wheezes. She has no rales. She exhibits no tenderness.  Abdominal: Soft. Bowel sounds are normal. She exhibits no distension and no mass. There is no tenderness. There is no rebound and no guarding.  Genitourinary: No breast swelling, tenderness, discharge or bleeding. Pelvic exam was performed with patient supine.  Genitourinary Comments: deferred  Musculoskeletal: Normal range of motion. She exhibits no edema or tenderness.  Lymphadenopathy:    She has no cervical adenopathy.  Neurological: She is alert and oriented to person, place, and time. She has normal reflexes. No cranial nerve deficit.  Skin: Skin is warm and dry. No rash noted. She is not diaphoretic. No erythema.  Psychiatric: She has a normal mood and affect. Her behavior is normal. Judgment and thought content normal.  Nursing note and vitals reviewed.   BP 102/68   Pulse 66   Temp 98.2 F (36.8 C) (Oral)   Ht 5\' 8"  (1.727 m)   Wt 150 lb (68 kg)   LMP 11/12/2017 (Exact Date)   SpO2 99%   BMI 22.81 kg/m  Wt Readings from Last 3 Encounters:  12/08/17 150 lb (68 kg)  12/06/16 147 lb 9.6 oz (67 kg)  07/31/15 148 lb 3.2 oz (67.2 kg)   BP Readings from Last 3 Encounters:  12/08/17 102/68  12/06/16 104/73  07/31/15 106/64     Immunization History  Administered Date(s) Administered  . Influenza,inj,Quad PF,6+ Mos 09/17/2013  . Influenza-Unspecified 10/13/2015, 10/22/2016, 10/27/2017  . Td 07/06/2010    Health Maintenance  Topic Date Due  . PAP SMEAR  12/07/2019  . TETANUS/TDAP  07/06/2020  . INFLUENZA VACCINE  Completed  . HIV Screening  Completed    Lab Results  Component Value Date   WBC 5.3 12/06/2016   HGB 11.8 (L) 12/06/2016   HCT 36.2 12/06/2016   PLT 392.0 12/06/2016   GLUCOSE 90 12/06/2016   CHOL 183 12/06/2016   TRIG 95.0 12/06/2016   HDL 69.90 12/06/2016   LDLCALC 94 12/06/2016   ALT 14 12/06/2016   AST 17 12/06/2016   NA 138 12/06/2016   K 3.5  12/06/2016   CL 103 12/06/2016   CREATININE 0.81 12/06/2016   BUN 12 12/06/2016   CO2 25 12/06/2016   TSH 0.52 12/06/2016    Lab Results  Component Value Date   TSH 0.52 12/06/2016   Lab Results  Component Value Date   WBC 5.3 12/06/2016   HGB 11.8 (L) 12/06/2016   HCT 36.2 12/06/2016   MCV 83.6 12/06/2016   PLT 392.0 12/06/2016   Lab Results  Component Value Date   NA 138 12/06/2016   K 3.5 12/06/2016   CO2 25 12/06/2016   GLUCOSE 90 12/06/2016   BUN 12 12/06/2016   CREATININE 0.81 12/06/2016   BILITOT 0.7 12/06/2016   ALKPHOS 68 12/06/2016   AST 17  12/06/2016   ALT 14 12/06/2016   PROT 7.5 12/06/2016   ALBUMIN 4.4 12/06/2016   CALCIUM 9.4 12/06/2016   GFR 83.20 12/06/2016   Lab Results  Component Value Date   CHOL 183 12/06/2016   Lab Results  Component Value Date   HDL 69.90 12/06/2016   Lab Results  Component Value Date   LDLCALC 94 12/06/2016   Lab Results  Component Value Date   TRIG 95.0 12/06/2016   Lab Results  Component Value Date   CHOLHDL 3 12/06/2016   No results found for: HGBA1C       Assessment & Plan:   Problem List Items Addressed This Visit      Unprioritized   Preventative health care - Primary    ghm utd Check labs  See avs       Relevant Orders   TSH   Lipid panel   CBC with Differential/Platelet   Comprehensive metabolic panel   POCT Urinalysis Dipstick (Automated) (Completed)      I am having Jeanne Davis maintain her Norgestimate-Ethinyl Estradiol Triphasic.  No orders of the defined types were placed in this encounter.   CMA served as Education administrator during this visit. History, Physical and Plan performed by medical provider. Documentation and orders reviewed and attested to.  [name], DO \

## 2017-12-13 ENCOUNTER — Telehealth: Payer: Self-pay | Admitting: Family Medicine

## 2017-12-13 DIAGNOSIS — D649 Anemia, unspecified: Secondary | ICD-10-CM

## 2017-12-13 NOTE — Telephone Encounter (Signed)
Patient notified of lab results

## 2017-12-13 NOTE — Telephone Encounter (Signed)
Copied from [place] [id number]. Topic: Quick Communication - Office Called Patient >> Dec 13, 2017  3:03 PM Robina Ade, Helene Kelp D wrote: Reason for CRM: Patient is returning office missed call she had. Please call patient back, thanks.

## 2018-01-09 ENCOUNTER — Other Ambulatory Visit: Payer: 59

## 2018-02-28 ENCOUNTER — Encounter: Payer: Self-pay | Admitting: Family

## 2018-02-28 ENCOUNTER — Ambulatory Visit: Payer: 59 | Admitting: Family

## 2018-02-28 VITALS — BP 109/67 | HR 73 | Temp 98.5°F | Resp 16 | Ht 68.0 in | Wt 147.0 lb

## 2018-02-28 DIAGNOSIS — A084 Viral intestinal infection, unspecified: Secondary | ICD-10-CM | POA: Diagnosis not present

## 2018-02-28 NOTE — Progress Notes (Signed)
Subjective:    Patient ID: Jeanne Davis, female    DOB: 07/19/1976, 42 y.o.   MRN: [id number]  HPI  Developed gas/bloating on Friday, became constipated on Saturday and then Sunday developed diarrhea.  Denies abdominal pain, fever or blood in stool. Denies nausea/vomiting.  She is drinking fluids. She reports coworkers with similar symptoms.  Yesterday she reports that she had 2 loose stools.  She has not had any diarrhea so far today.    Review of Systems See HP  No past medical history on file.   Social History   Socioeconomic History  . Marital status: Single    Spouse name: Not on file  . Number of children: Not on file  . Years of education: Not on file  . Highest education level: Not on file  Social Needs  . Financial resource strain: Not on file  . Food insecurity - worry: Not on file  . Food insecurity - inability: Not on file  . Transportation needs - medical: Not on file  . Transportation needs - non-medical: Not on file  Occupational History  . 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[employer]  Tobacco Use  . Smoking status: Never Smoker  . Smokeless tobacco: Never Used  Substance and Sexual Activity  . Alcohol use: No  . Drug use: No  . Sexual activity: No    Partners: Male  Other Topics Concern  . Not on file  Social History Narrative   Exercise --- no    Past Surgical History:  Procedure Laterality Date  . WISDOM TOOTH EXTRACTION      Family History  Problem Relation Age of Onset  . Coronary artery disease Father   . Hyperlipidemia Father   . Diabetes Father   . Hypertension Father   . Hyperlipidemia Mother   . Diabetes Sister        type I---half sister  . Diabetes Maternal Grandmother   . Heart disease Maternal Grandmother        CHF  . Breast cancer Maternal Aunt   . Cancer Maternal Aunt        breast ca  . Heart attack Maternal Aunt   . Cancer Maternal Aunt        fallopian tubes  . Cancer Maternal Uncle        tumor kidney, colon cancer  .  Cancer Paternal Aunt         breast    No Known Allergies  Current Outpatient Medications on File Prior to Visit  Medication Sig Dispense Refill  . Norgestimate-Ethinyl Estradiol Triphasic 0.18/0.215/0.25 MG-35 MCG tablet Take 1 tablet by mouth daily. (Patient not taking: Reported on 02/28/2018) 3 Package 3   No current facility-administered medications on file prior to visit.     BP 109/67 (BP Location: Right Arm, Patient Position: Sitting, Cuff Size: Normal)   Pulse 73   Temp 98.5 F (36.9 C) (Oral)   Resp 16   Ht 5\' 8"  (1.727 m)   Wt 147 lb (66.7 kg)   SpO2 100%   BMI 22.35 kg/m       Objective:   Physical Exam  Constitutional: She is oriented to person, place, and time. She appears well-developed and well-nourished.  HENT:  Head: Normocephalic and atraumatic.  Cardiovascular: Normal rate, regular rhythm and normal heart sounds.  No murmur heard. Pulmonary/Chest: Effort normal and breath sounds normal. No respiratory distress. She has no wheezes.  Abdominal: Soft. Bowel sounds are normal. She exhibits no distension. There is  no tenderness. There is no rebound.  Neurological: She is alert and oriented to person, place, and time.  Psychiatric: She has a normal mood and affect. Her behavior is normal. Judgment and thought content normal.          Assessment & Plan:  Viral gastroenteritis- advised pt on adequate hydration, advancing diet as tolerated, pepto bismol and imodium as needed.  She is advised to call if new/worseing symptoms or if not improved in 3-4 days.

## 2018-02-28 NOTE — Patient Instructions (Signed)
Please call if new/worsening symptoms or if you are not improved in 3-4 days. You may use pepto bismol or imodium as needed for diarrhea.    Viral Gastroenteritis, Adult Viral gastroenteritis is also known as the stomach flu. This condition is caused by certain germs (viruses). These germs can be passed from person to person very easily (are very contagious). This condition can cause sudden watery poop (diarrhea), fever, and throwing up (vomiting). Having watery poop and throwing up can make you feel weak and cause you to get dehydrated. Dehydration can make you tired and thirsty, make you have a dry mouth, and make it so you pee (urinate) less often. Older adults and people with other diseases or a weak defense system (immune system) are at higher risk for dehydration. It is important to replace the fluids that you lose from having watery poop and throwing up. Follow these instructions at home: Follow instructions from your doctor about how to care for yourself at home. Eating and drinking  Follow these instructions as told by your doctor:  Take an oral rehydration solution (ORS). This is a drink that is sold at pharmacies and stores.  Drink clear fluids in small amounts as you are able, such as: ? Water. ? Ice chips. ? Diluted fruit juice. ? Low-calorie sports drinks.  Eat bland, easy-to-digest foods in small amounts as you are able, such as: ? Bananas. ? Applesauce. ? Rice. ? Low-fat (lean) meats. ? Toast. ? Crackers.  Avoid fluids that have a lot of sugar or caffeine in them.  Avoid alcohol.  Avoid spicy or fatty foods.  General instructions  Drink enough fluid to keep your pee (urine) clear or pale yellow.  Wash your hands often. If you cannot use soap and water, use hand sanitizer.  Make sure that all people in your home wash their hands well and often.  Rest at home while you get better.  Take over-the-counter and prescription medicines only as told by your  doctor.  Watch your condition for any changes.  Take a warm bath to help with any burning or pain from having watery poop.  Keep all follow-up visits as told by your doctor. This is important. Contact a doctor if:  You cannot keep fluids down.  Your symptoms get worse.  You have new symptoms.  You feel light-headed or dizzy.  You have muscle cramps. Get help right away if:  You have chest pain.  You feel very weak or you pass out (faint).  You see blood in your throw-up.  Your throw-up looks like coffee grounds.  You have bloody or black poop (stools) or poop that look like tar.  You have a very bad headache, a stiff neck, or both.  You have a rash.  You have very bad pain, cramping, or bloating in your belly (abdomen).  You have trouble breathing.  You are breathing very quickly.  Your heart is beating very quickly.  Your skin feels cold and clammy.  You feel confused.  You have pain when you pee.  You have signs of dehydration, such as: ? Dark pee, hardly any pee, or no pee. ? Cracked lips. ? Dry mouth. ? Sunken eyes. ? Sleepiness. ? Weakness. This information is not intended to replace advice given to you by your health care provider. Make sure you discuss any questions you have with your health care provider. Document Released: 05/31/2008 Document Revised: 07/02/2016 Document Reviewed: 08/19/2015 Elsevier Interactive Patient Education  2017 [employer].

## 2018-05-08 ENCOUNTER — Encounter: Payer: Self-pay | Admitting: Family Medicine

## 2018-05-08 ENCOUNTER — Ambulatory Visit: Payer: 59 | Admitting: Family Medicine

## 2018-05-08 ENCOUNTER — Other Ambulatory Visit (HOSPITAL_COMMUNITY)
Admission: RE | Admit: 2018-05-08 | Discharge: 2018-05-08 | Disposition: A | Discharge status: Home / Self Care | Payer: 59 | Source: Ambulatory Visit | Attending: Family Medicine | Admitting: Family Medicine

## 2018-05-08 VITALS — BP 100/66 | HR 70 | Temp 98.0°F | Resp 16 | Ht 68.0 in | Wt 146.8 lb

## 2018-05-08 DIAGNOSIS — N760 Acute vaginitis: Secondary | ICD-10-CM

## 2018-05-08 LAB — POC URINALSYSI DIPSTICK (AUTOMATED)
Bilirubin, UA: NEGATIVE
Glucose, UA: NEGATIVE
Ketones, UA: NEGATIVE
Leukocytes, UA: NEGATIVE
Nitrite, UA: NEGATIVE
PH UA: 6 (ref 5.0–8.0)
PROTEIN UA: NEGATIVE
RBC UA: NEGATIVE
Spec Grav, UA: 1.01 (ref 1.010–1.025)
UROBILINOGEN UA: 0.2 U/dL

## 2018-05-08 MED ORDER — FLUCONAZOLE 150 MG PO TABS: 150.0000 mg | ORAL_TABLET | Freq: Once | ORAL | 0 refills | Status: AC

## 2018-05-08 NOTE — Progress Notes (Signed)
cyPatient ID: Jeanne Davis, female   DOB: [birth year], 42 y.o.   MRN: [id number]    Subjective:  I acted as a Education administrator for Dr. Carollee Herter.  Guerry Bruin, [place]   Patient ID: Jeanne Davis, female    DOB: 1976-07-19, 42 y.o.   MRN: [id number]  Chief Complaint  Patient presents with  . Vaginal Discharge    Vaginal Discharge  The patient's primary symptoms include genital itching and vaginal discharge. The patient's pertinent negatives include no genital odor or pelvic pain. This is a new problem. Episode onset: 2 weeks. Pertinent negatives include no abdominal pain, back pain, chills, constipation, diarrhea, dysuria, fever, frequency, headaches, hematuria, nausea, rash, urgency or vomiting. She has tried nothing for the symptoms.    Patient is in today for vaginal discharge.  Patient Care Team: Carollee Herter, Alferd Apa, DO as PCP - General   No past medical history on file.  Past Surgical History:  Procedure Laterality Date  . WISDOM TOOTH EXTRACTION      Family History  Problem Relation Age of Onset  . Coronary artery disease Father   . Hyperlipidemia Father   . Diabetes Father   . Hypertension Father   . Hyperlipidemia Mother   . Diabetes Sister        type I---half sister  . Diabetes Maternal Grandmother   . Heart disease Maternal Grandmother        CHF  . Breast cancer Maternal Aunt   . Cancer Maternal Aunt        breast ca  . Heart attack Maternal Aunt   . Cancer Maternal Aunt        fallopian tubes  . Cancer Maternal Uncle        tumor kidney, colon cancer  . Cancer Paternal Aunt         breast    Social History   Socioeconomic History  . Marital status: Single    Spouse name: Not on file  . Number of children: Not on file  . Years of education: Not on file  . Highest education level: Not on file  Occupational History  . 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 Financial resource strain: Not on file  . Food insecurity:    Worry: Not on file   Inability: Not on file  . Transportation needs:    Medical: Not on file    Non-medical: Not on file  Tobacco Use  . Smoking status: Never Smoker  . Smokeless tobacco: Never Used  Substance and Sexual Activity  . Alcohol use: No  . Drug use: No  . Sexual activity: Never    Partners: Male  Lifestyle  . Physical activity:    Days per week: Not on file    Minutes per session: Not on file  . Stress: Not on file  Relationships  . Social connections:    Talks on phone: Not on file    Gets together: Not on file    Attends religious service: Not on file    Active member of club or organization: Not on file    Attends meetings of clubs or organizations: Not on file    Relationship status: Not on file  . Intimate partner violence:    Fear of current or ex partner: Not on file    Emotionally abused: Not on file    Physically abused: Not on file    Forced sexual activity: Not on file  Other Topics Concern  . Not on  file  Social History Narrative   Exercise --- no    Outpatient Medications Prior to Visit  Medication Sig Dispense Refill  . Norgestimate-Ethinyl Estradiol Triphasic 0.18/0.215/0.25 MG-35 MCG tablet Take 1 tablet by mouth daily. (Patient not taking: Reported on 02/28/2018) 3 Package 3   No facility-administered medications prior to visit.     No Known Allergies  Review of Systems  Constitutional: Negative for chills, fever and malaise/fatigue.  HENT: Negative for congestion and hearing loss.   Eyes: Negative for discharge.  Respiratory: Negative for cough, sputum production and shortness of breath.   Cardiovascular: Negative for chest pain, palpitations and leg swelling.  Gastrointestinal: Negative for abdominal pain, blood in stool, constipation, diarrhea, heartburn, nausea and vomiting.  Genitourinary: Positive for vaginal discharge. Negative for dysuria, frequency, hematuria, pelvic pain and urgency.  Musculoskeletal: Negative for back pain, falls and myalgias.    Skin: Negative for rash.  Neurological: Negative for dizziness, sensory change, loss of consciousness, weakness and headaches.  Endo/Heme/Allergies: Negative for environmental allergies. Does not bruise/bleed easily.  Psychiatric/Behavioral: Negative for depression and suicidal ideas. The patient is not nervous/anxious and does not have insomnia.        Objective:    Physical Exam  Constitutional: She is oriented to person, place, and time. She appears well-developed and well-nourished.  HENT:  Head: Normocephalic and atraumatic.  Eyes: Conjunctivae and EOM are normal.  Neck: Normal range of motion. Neck supple. No JVD present. Carotid bruit is not present. No thyromegaly present.  Cardiovascular: Normal rate, regular rhythm and normal heart sounds.  No murmur heard. Pulmonary/Chest: Effort normal and breath sounds normal. No respiratory distress. She has no wheezes. She has no rales. She exhibits no tenderness.  Abdominal: Soft. She exhibits no distension. There is no tenderness. There is no rebound, no guarding and no CVA tenderness.  Genitourinary: Uterus normal. Pelvic exam was performed with patient supine. There is no rash, tenderness, lesion or injury on the right labia. There is no rash, tenderness, lesion or injury on the left labia. Cervix exhibits discharge. Cervix exhibits no motion tenderness and no friability. Right adnexum displays no mass, no tenderness and no fullness. Left adnexum displays no mass, no tenderness and no fullness. No erythema, tenderness or bleeding in the vagina. No foreign body in the vagina. Vaginal discharge found.  Musculoskeletal: She exhibits no edema.  Neurological: She is alert and oriented to person, place, and time.  Psychiatric: She has a normal mood and affect.  Nursing note and vitals reviewed.   BP 100/66 (BP Location: Right Arm, Cuff Size: Normal)   Pulse 70   Temp 98 F (36.7 C) (Oral)   Resp 16   Ht 5\' 8"  (1.727 m)   Wt 146 lb 12.8  oz (66.6 kg)   LMP 04/30/2018   SpO2 97%   BMI 22.32 kg/m  Wt Readings from Last 3 Encounters:  05/08/18 146 lb 12.8 oz (66.6 kg)  02/28/18 147 lb (66.7 kg)  12/08/17 150 lb (68 kg)   BP Readings from Last 3 Encounters:  05/08/18 100/66  02/28/18 109/67  12/08/17 102/68     Immunization History  Administered Date(s) Administered  . Influenza,inj,Quad PF,6+ Mos 09/17/2013  . Influenza-Unspecified 10/13/2015, 10/22/2016, 10/27/2017  . Td 07/06/2010    Health Maintenance  Topic Date Due  . INFLUENZA VACCINE  07/27/2018  . PAP SMEAR  12/07/2019  . TETANUS/TDAP  07/06/2020  . HIV Screening  Completed    Lab Results  Component Value Date  WBC 6.2 12/08/2017   HGB 10.9 (L) 12/08/2017   HCT 34.6 (L) 12/08/2017   PLT 368.0 12/08/2017   GLUCOSE 93 12/08/2017   CHOL 173 12/08/2017   TRIG 90.0 12/08/2017   HDL 78.50 12/08/2017   LDLCALC 76 12/08/2017   ALT 8 12/08/2017   AST 11 12/08/2017   NA 139 12/08/2017   K 4.0 12/08/2017   CL 105 12/08/2017   CREATININE 0.82 12/08/2017   BUN 10 12/08/2017   CO2 24 12/08/2017   TSH 0.91 12/08/2017    Lab Results  Component Value Date   TSH 0.91 12/08/2017   Lab Results  Component Value Date   WBC 6.2 12/08/2017   HGB 10.9 (L) 12/08/2017   HCT 34.6 (L) 12/08/2017   MCV 84.9 12/08/2017   PLT 368.0 12/08/2017   Lab Results  Component Value Date   NA 139 12/08/2017   K 4.0 12/08/2017   CO2 24 12/08/2017   GLUCOSE 93 12/08/2017   BUN 10 12/08/2017   CREATININE 0.82 12/08/2017   BILITOT 0.4 12/08/2017   ALKPHOS 65 12/08/2017   AST 11 12/08/2017   ALT 8 12/08/2017   PROT 7.2 12/08/2017   ALBUMIN 4.3 12/08/2017   CALCIUM 9.1 12/08/2017   GFR 98.76 12/08/2017   Lab Results  Component Value Date   CHOL 173 12/08/2017   Lab Results  Component Value Date   HDL 78.50 12/08/2017   Lab Results  Component Value Date   LDLCALC 76 12/08/2017   Lab Results  Component Value Date   TRIG 90.0 12/08/2017   Lab  Results  Component Value Date   CHOLHDL 2 12/08/2017   No results found for: HGBA1C       Assessment & Plan:   Problem List Items Addressed This Visit    None    Visit Diagnoses    Acute vaginitis    -  Primary   Relevant Orders   Cervicovaginal ancillary only   POCT Urinalysis Dipstick (Automated) (Completed)    will wait for culture/ wet prep to come back Call or rto if symptoms worsen   I have discontinued Alekhya Raczka's Norgestimate-Ethinyl Estradiol Triphasic. I am also having her start on fluconazole.  Meds ordered this encounter  Medications  . fluconazole (DIFLUCAN) 150 MG tablet    Sig: Take 1 tablet (150 mg total) by mouth once for 1 dose.    Dispense:  1 tablet    Refill:  0    CMA served as scribe during this visit. History, Physical and Plan performed by medical provider. Documentation and orders reviewed and attested to.   [name], DO

## 2018-05-10 ENCOUNTER — Encounter: Payer: Self-pay | Admitting: Family Medicine

## 2018-05-10 LAB — CERVICOVAGINAL ANCILLARY ONLY
Bacterial vaginitis: NEGATIVE
Candida vaginitis: NEGATIVE
Chlamydia: NEGATIVE
NEISSERIA GONORRHEA: NEGATIVE
TRICH (WINDOWPATH): NEGATIVE

## 2019-02-05 ENCOUNTER — Other Ambulatory Visit (HOSPITAL_BASED_OUTPATIENT_CLINIC_OR_DEPARTMENT_OTHER): Payer: Self-pay | Admitting: Family Medicine

## 2019-02-05 DIAGNOSIS — Z1231 Encounter for screening mammogram for malignant neoplasm of breast: Secondary | ICD-10-CM

## 2019-02-19 ENCOUNTER — Ambulatory Visit (HOSPITAL_BASED_OUTPATIENT_CLINIC_OR_DEPARTMENT_OTHER): Payer: 59

## 2019-02-20 ENCOUNTER — Ambulatory Visit (HOSPITAL_BASED_OUTPATIENT_CLINIC_OR_DEPARTMENT_OTHER)
Admission: RE | Admit: 2019-02-20 | Discharge: 2019-02-20 | Disposition: A | Discharge status: Home / Self Care | Payer: 59 | Source: Ambulatory Visit | Attending: Family Medicine | Admitting: Family Medicine

## 2019-02-20 DIAGNOSIS — Z1231 Encounter for screening mammogram for malignant neoplasm of breast: Secondary | ICD-10-CM | POA: Insufficient documentation

## 2019-02-20 IMAGING — MG DIGITAL SCREENING BILATERAL MAMMOGRAM WITH TOMO AND CAD
6 of 10 series · 6 of 30 positions shown · non-contrast
Comparison: Previous exam(s).

CLINICAL DATA: Screening.

EXAM:
DIGITAL SCREENING BILATERAL MAMMOGRAM WITH TOMO AND CAD

[R MLO synth-2D (1 of 2)]
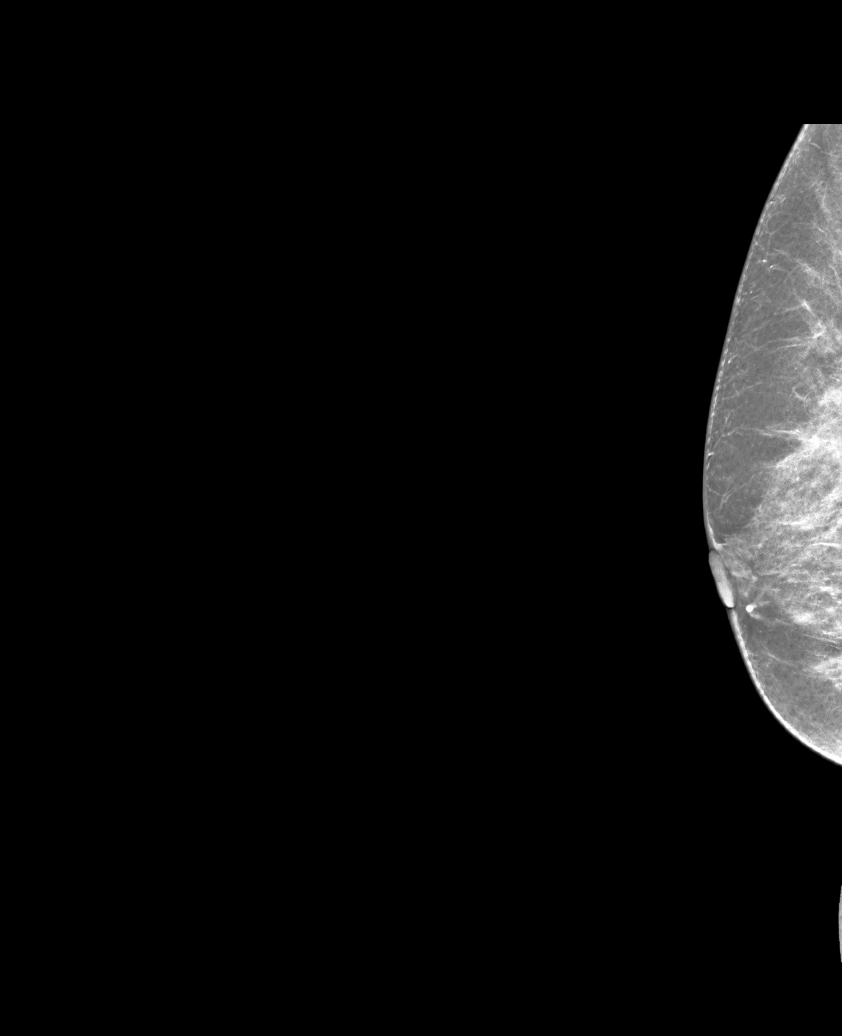

[L CC synth-2D]
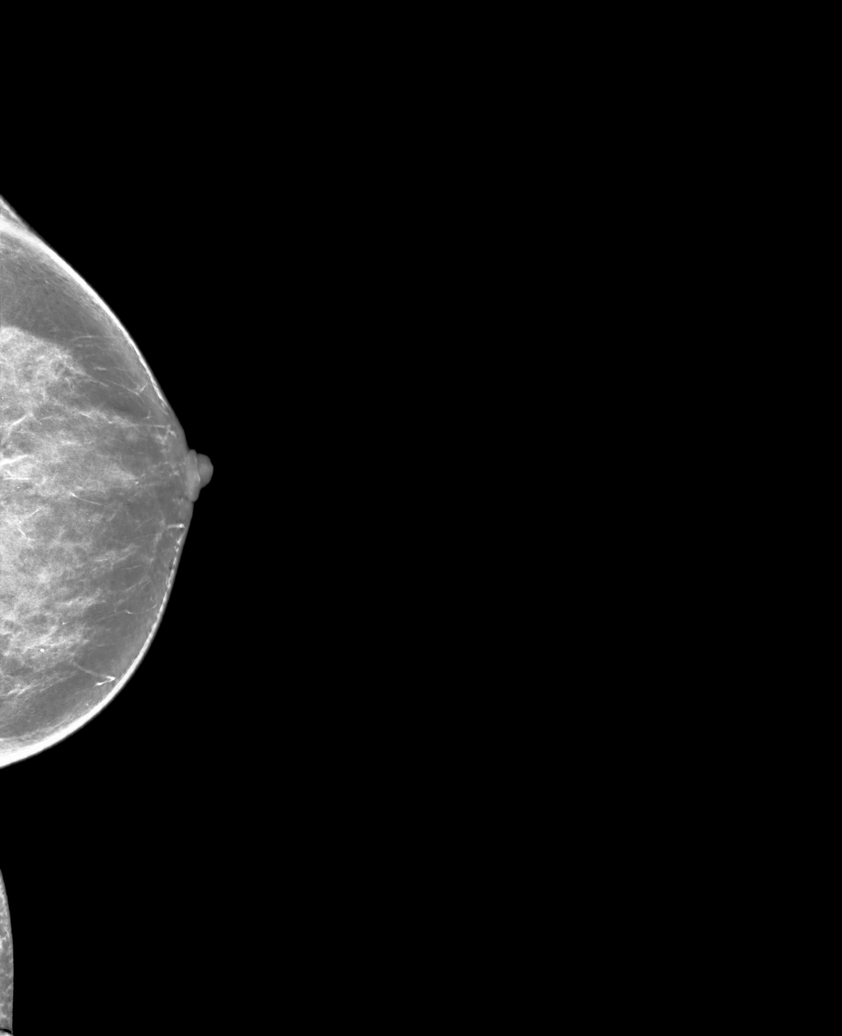

[R CC synth-2D]
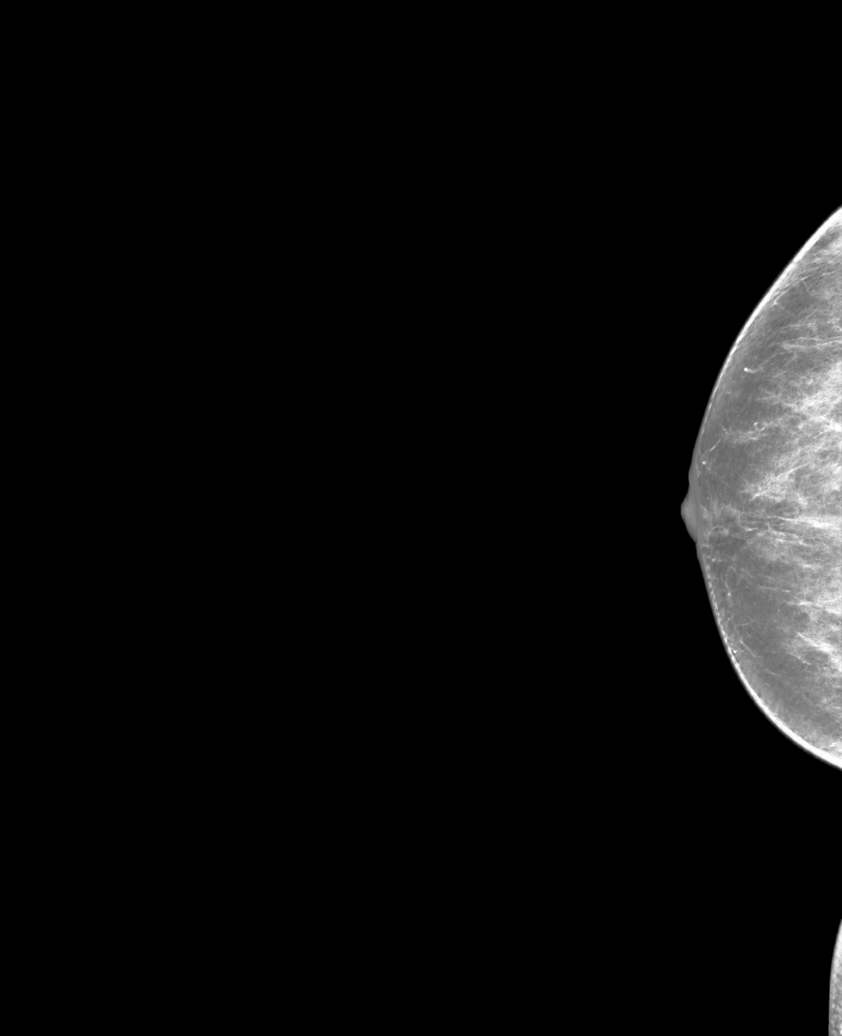

[R MLO synth-2D (2 of 2)]
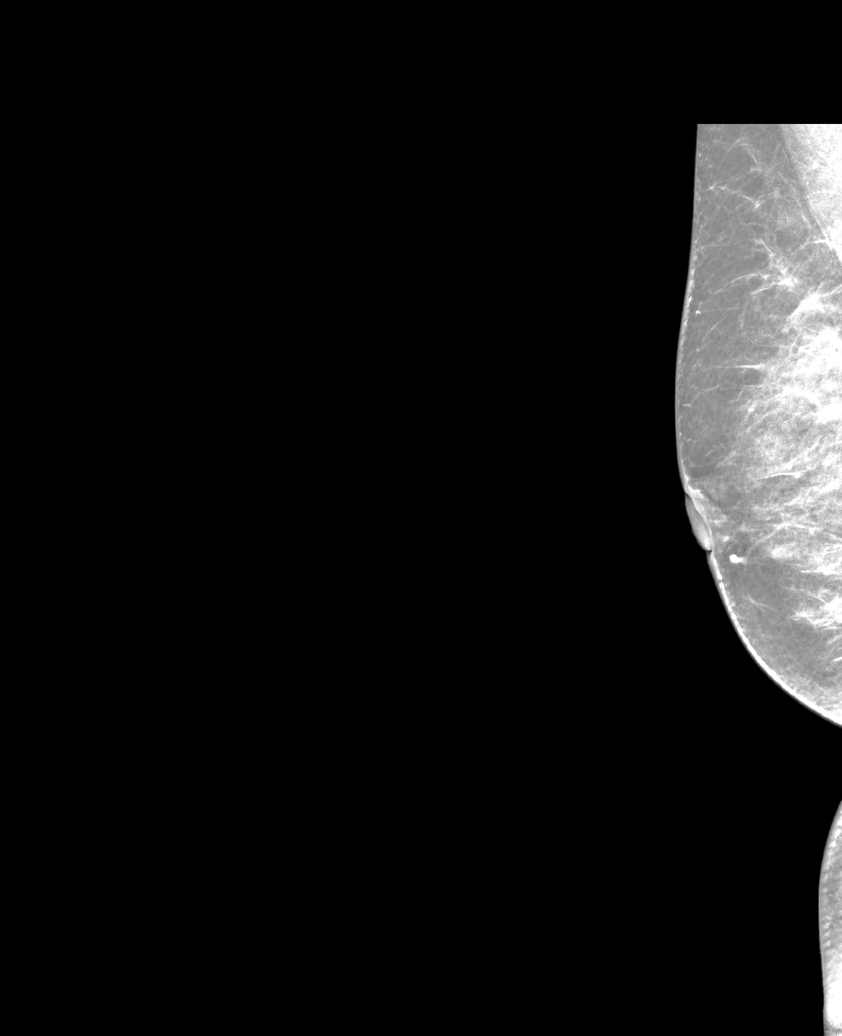

[L MLO synth-2D]
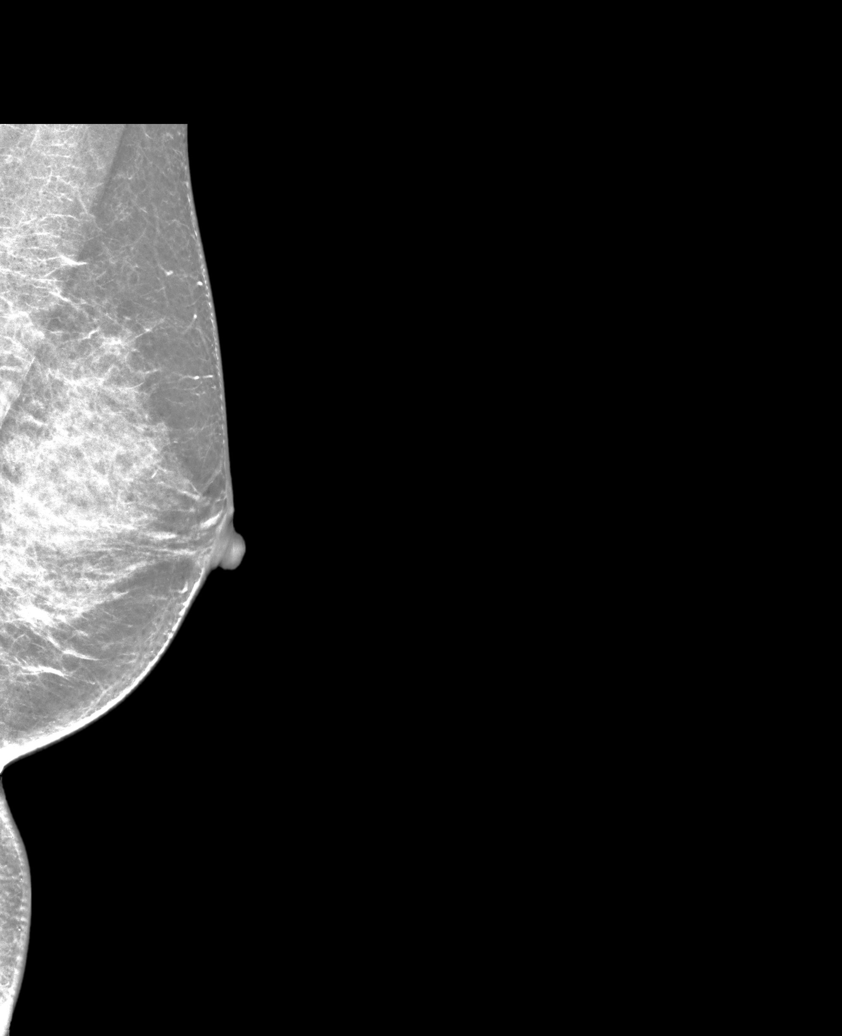

[R MLO tomo · tomo slice 31/60.0]
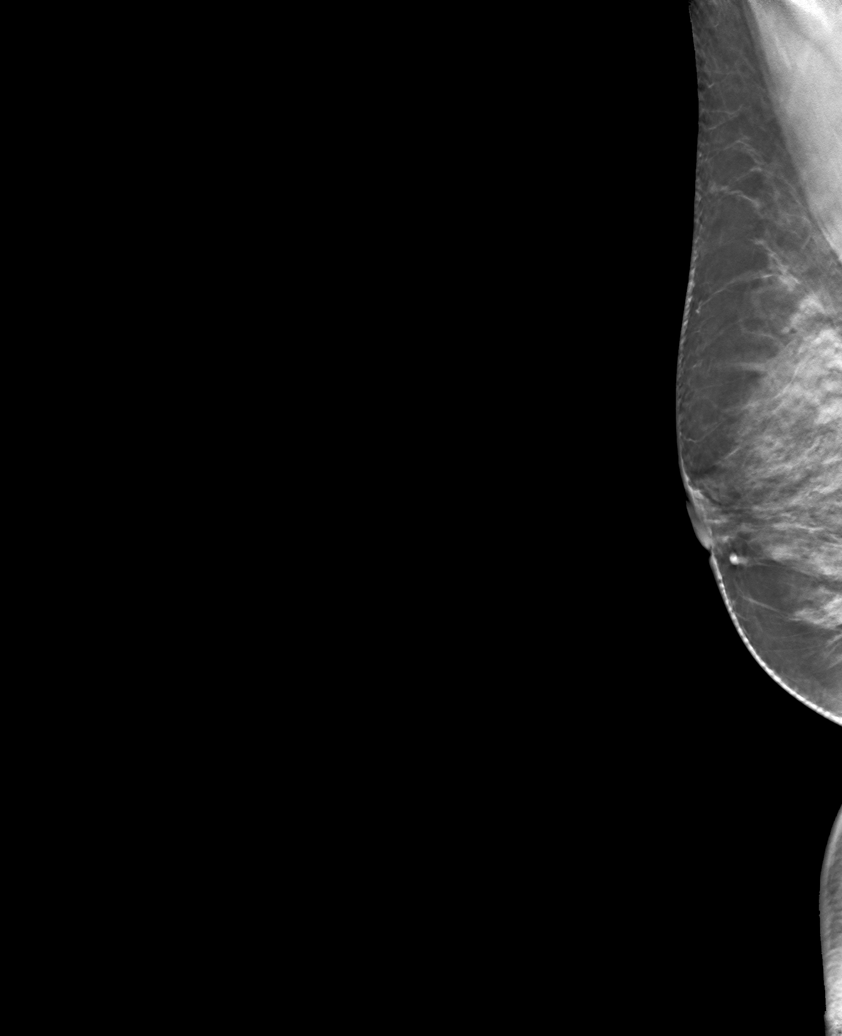

[6 of 30 positions shown; findings below may reference images not displayed]

ACR Breast Density Category c: The breast tissue is heterogeneously
dense, which may obscure small masses.
FINDINGS: In the left breast, calcifications warrant further evaluation. In
the right breast, no findings suspicious for malignancy. Images were
processed with CAD.
IMPRESSION: Further evaluation is suggested for calcifications in the left
breast.

RECOMMENDATION:
Diagnostic mammogram of the left breast. (Code:[B7])

The patient will be contacted regarding the findings, and additional
imaging will be scheduled.

BI-RADS CATEGORY  0: Incomplete. Need additional imaging evaluation
and/or prior mammograms for comparison.

## 2019-02-21 ENCOUNTER — Other Ambulatory Visit: Payer: Self-pay | Admitting: Family Medicine

## 2019-02-21 DIAGNOSIS — R928 Other abnormal and inconclusive findings on diagnostic imaging of breast: Secondary | ICD-10-CM

## 2019-02-26 ENCOUNTER — Ambulatory Visit
Admission: RE | Admit: 2019-02-26 | Discharge: 2019-02-26 | Disposition: A | Discharge status: Home / Self Care | Payer: 59 | Source: Ambulatory Visit | Attending: Family Medicine | Admitting: Family Medicine

## 2019-02-26 ENCOUNTER — Telehealth: Payer: Self-pay | Admitting: *Deleted

## 2019-02-26 DIAGNOSIS — R922 Inconclusive mammogram: Secondary | ICD-10-CM | POA: Diagnosis not present

## 2019-02-26 DIAGNOSIS — R928 Other abnormal and inconclusive findings on diagnostic imaging of breast: Secondary | ICD-10-CM

## 2019-02-26 IMAGING — MG DIGITAL DIAGNOSTIC UNILATERAL LEFT MAMMOGRAM
2 series · 2 of 10 positions shown · non-contrast
Comparison: Previous exam(s).

CLINICAL DATA: Callback from screening mammogram breast. Repeat CC
and MLO views of the right breast were also performed at the request
of the screening physician reader.

EXAM:
DIGITAL DIAGNOSTIC BILATERAL MAMMOGRAM WITH CAD

[R MLO tomo · tomo slice 33/66.0]
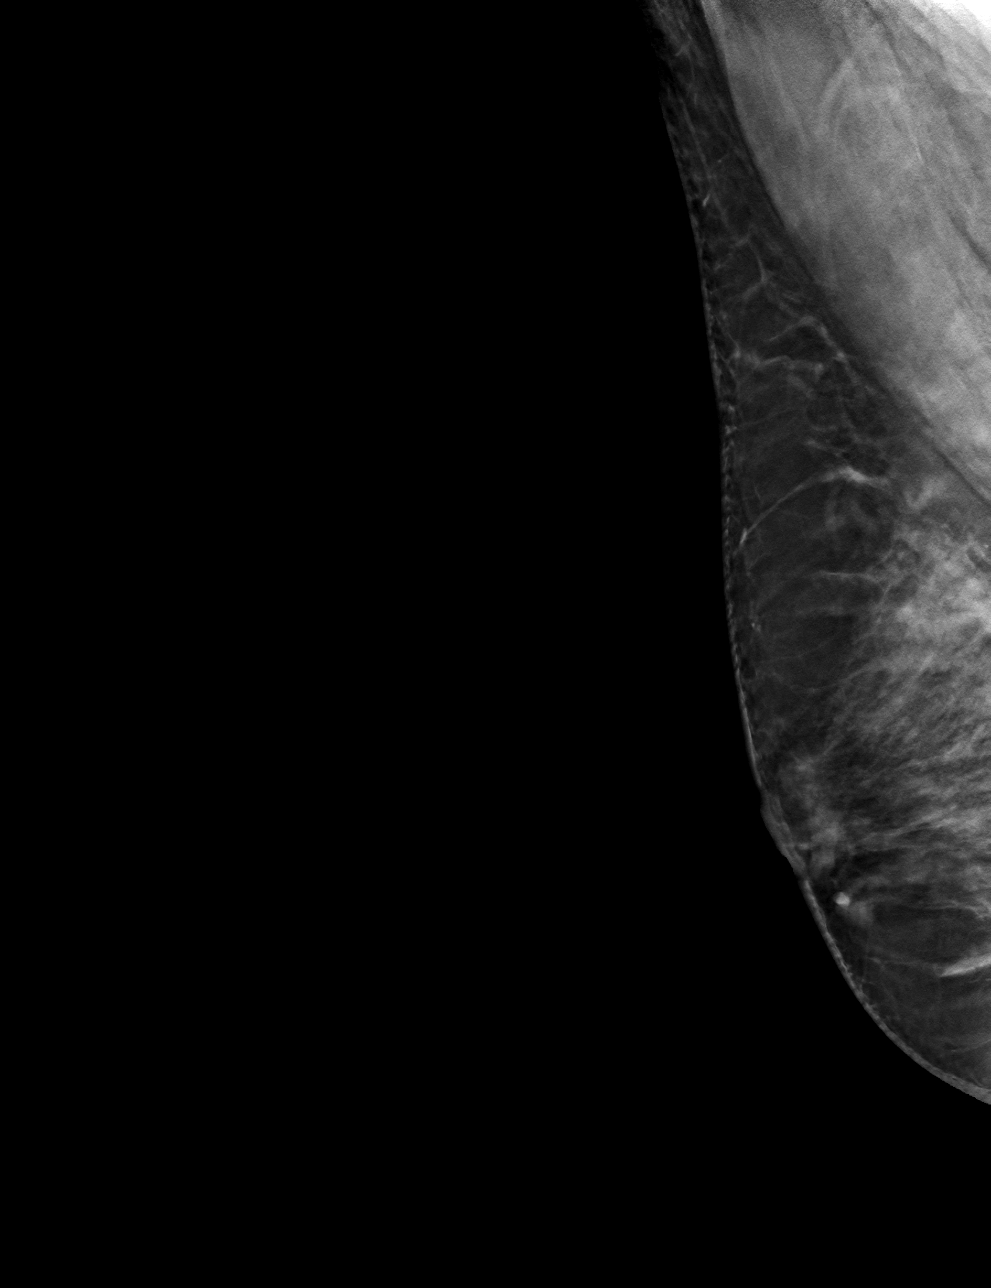

[R CC tomo · tomo slice 33/66.0]
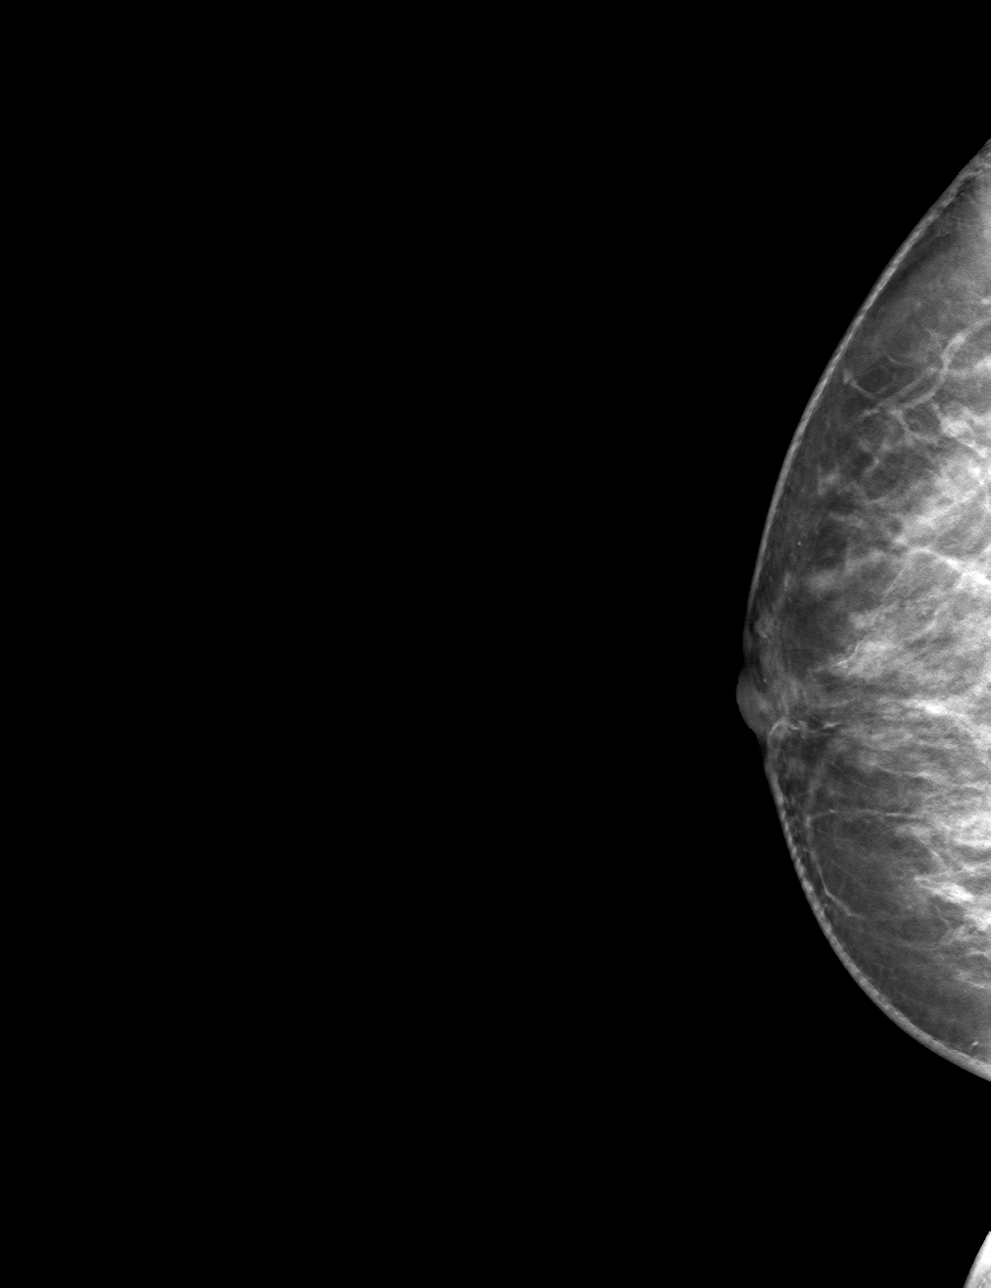

[2 of 10 positions shown; findings below may reference images not displayed]

ACR Breast Density Category d: The breast tissue is extremely dense,
which lowers the sensitivity of mammography.
FINDINGS: Cc and MLO views of the right breast, spot magnification CC and
lateral, and MLO views of the left breast are submitted. The
previously questioned calcifications in the central posterior left
breast are scattered benign appearing. No suspicious
microcalcifications are noted. The right breast is negative.

Mammographic images were processed with CAD.
IMPRESSION: Benign findings.

RECOMMENDATION:
Routine screening mammogram back on schedule.

I have discussed the findings and recommendations with the patient.
Results were also provided in writing at the conclusion of the
visit. If applicable, a reminder letter will be sent to the patient
regarding the next appointment.

BI-RADS CATEGORY  2: Benign.

## 2019-02-26 NOTE — Telephone Encounter (Signed)
Received Physician Orders from [place]; forwarded to provider/SLS 03/02

## 2019-10-09 ENCOUNTER — Other Ambulatory Visit: Payer: Self-pay

## 2019-10-09 ENCOUNTER — Ambulatory Visit (INDEPENDENT_AMBULATORY_CARE_PROVIDER_SITE_OTHER): Payer: 59

## 2019-10-09 DIAGNOSIS — Z23 Encounter for immunization: Secondary | ICD-10-CM | POA: Diagnosis not present

## 2020-02-25 ENCOUNTER — Other Ambulatory Visit: Payer: Self-pay

## 2020-02-26 ENCOUNTER — Other Ambulatory Visit (HOSPITAL_BASED_OUTPATIENT_CLINIC_OR_DEPARTMENT_OTHER): Payer: Self-pay | Admitting: Family Medicine

## 2020-02-26 ENCOUNTER — Ambulatory Visit (INDEPENDENT_AMBULATORY_CARE_PROVIDER_SITE_OTHER): Payer: 59 | Admitting: Family Medicine

## 2020-02-26 ENCOUNTER — Encounter: Payer: Self-pay | Admitting: Family Medicine

## 2020-02-26 ENCOUNTER — Other Ambulatory Visit: Payer: Self-pay

## 2020-02-26 ENCOUNTER — Other Ambulatory Visit (HOSPITAL_COMMUNITY)
Admission: RE | Admit: 2020-02-26 | Discharge: 2020-02-26 | Disposition: A | Discharge status: Home / Self Care | Payer: 59 | Source: Ambulatory Visit | Attending: Family Medicine | Admitting: Family Medicine

## 2020-02-26 VITALS — BP 100/66 | HR 61 | Temp 97.8°F | Resp 18 | Ht 68.0 in | Wt 144.4 lb

## 2020-02-26 DIAGNOSIS — Z3009 Encounter for other general counseling and advice on contraception: Secondary | ICD-10-CM | POA: Diagnosis not present

## 2020-02-26 DIAGNOSIS — Z0000 Encounter for general adult medical examination without abnormal findings: Secondary | ICD-10-CM | POA: Diagnosis present

## 2020-02-26 DIAGNOSIS — Z1231 Encounter for screening mammogram for malignant neoplasm of breast: Secondary | ICD-10-CM

## 2020-02-26 NOTE — Patient Instructions (Signed)

## 2020-02-26 NOTE — Progress Notes (Signed)
Subjective:     Jeanne Davis is a 44 y.o. female and is here for a comprehensive physical exam. The patient reports no problems.  Social History   Socioeconomic History  . Marital status: Single    Spouse name: Not on file  . Number of children: Not on file  . Years of education: Not on file  . Highest education level: Not on file  Occupational History  . 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[employer]  Tobacco Use  . Smoking status: Never Smoker  . Smokeless tobacco: Never Used  Substance and Sexual Activity  . Alcohol use: No  . Drug use: No  . Sexual activity: Never    Partners: Male  Other Topics Concern  . Not on file  Social History Narrative   Exercise --- no   Social Determinants of Health   Financial Resource Strain:   . Difficulty of Paying Living Expenses: Not on file  Food Insecurity:   . Worried About Charity fundraiser in the Last Year: Not on file  . Ran Out of Food in the Last Year: Not on file  Transportation Needs:   . Lack of Transportation (Medical): Not on file  . Lack of Transportation (Non-Medical): Not on file  Physical Activity:   . Days of Exercise per Week: Not on file  . Minutes of Exercise per Session: Not on file  Stress:   . Feeling of Stress : Not on file  Social Connections:   . Frequency of Communication with Friends and Family: Not on file  . Frequency of Social Gatherings with Friends and Family: Not on file  . Attends Religious Services: Not on file  . Active Member of Clubs or Organizations: Not on file  . Attends Archivist Meetings: Not on file  . Marital Status: Not on file  Intimate Partner Violence:   . Fear of Current or Ex-Partner: Not on file  . Emotionally Abused: Not on file  . Physically Abused: Not on file  . Sexually Abused: Not on file   Health Maintenance  Topic Date Due  . TETANUS/TDAP  07/06/2020  . PAP SMEAR-Modifier  02/26/2023  . INFLUENZA VACCINE  Completed  . HIV Screening  Completed    The  following portions of the patient's history were reviewed and updated as appropriate:  She  has no past medical history on file. She does not have any pertinent problems on file. She  has a past surgical history that includes Wisdom tooth extraction. Her family history includes Breast cancer in her maternal aunt; Cancer in her maternal aunt, maternal aunt, maternal uncle, and paternal aunt; Coronary artery disease in her father; Diabetes in her father, maternal grandmother, and sister; Heart attack in her maternal aunt; Heart disease in her maternal grandmother; Hyperlipidemia in her father and mother; Hypertension in her father. She  reports that she has never smoked. She has never used smokeless tobacco. She reports that she does not drink alcohol or use drugs. She currently has no medications in their medication list. No current outpatient medications on file prior to visit.   No current facility-administered medications on file prior to visit.   She has No Known Allergies..  Review of Systems Review of Systems  Constitutional: Negative for activity change, appetite change and fatigue.  HENT: Negative for hearing loss, congestion, tinnitus and ear discharge.  dentist q24m Eyes: Negative for visual disturbance (see optho q1y -- vision corrected to 20/20 with glasses).  Respiratory: Negative for cough, chest  tightness and shortness of breath.   Cardiovascular: Negative for chest pain, palpitations and leg swelling.  Gastrointestinal: Negative for abdominal pain, diarrhea, constipation and abdominal distention.  Genitourinary: Negative for urgency, frequency, decreased urine volume and difficulty urinating.  Musculoskeletal: Negative for back pain, arthralgias and gait problem.  Skin: Negative for color change, pallor and rash.  Neurological: Negative for dizziness, light-headedness, numbness and headaches.  Hematological: Negative for adenopathy. Does not bruise/bleed easily.   Psychiatric/Behavioral: Negative for suicidal ideas, confusion, sleep disturbance, self-injury, dysphoric mood, decreased concentration and agitation.       Objective:    BP 100/66 (BP Location: Left Arm, Patient Position: Sitting, Cuff Size: Normal)   Pulse 61   Temp 97.8 F (36.6 C) (Temporal)   Resp 18   Ht 5\' 8"  (1.727 m)   Wt 144 lb 6.4 oz (65.5 kg)   LMP 02/17/2020   SpO2 100%   BMI 21.96 kg/m  General appearance: alert, cooperative, appears stated age and no distress Head: Normocephalic, without obvious abnormality, atraumatic Eyes: conjunctivae/corneas clear. PERRL, EOM's intact. Fundi benign. Ears: normal TM's and external ear canals both ears Nose: Nares normal. Septum midline. Mucosa normal. No drainage or sinus tenderness. Throat: lips, mucosa, and tongue normal; teeth and gums normal Neck: no adenopathy, no carotid bruit, no JVD, supple, symmetrical, trachea midline and thyroid not enlarged, symmetric, no tenderness/mass/nodules Back: symmetric, no curvature. ROM normal. No CVA tenderness. Lungs: clear to auscultation bilaterally Breasts: normal appearance, no masses or tenderness Heart: regular rate and rhythm, S1, S2 normal, no murmur, click, rub or gallop Abdomen: soft, non-tender; bowel sounds normal; no masses,  no organomegaly Pelvic: cervix normal in appearance, external genitalia normal, no adnexal masses or tenderness, no cervical motion tenderness, rectovaginal septum normal, uterus normal size, shape, and consistency, vagina normal without discharge and pap done Extremities: extremities normal, atraumatic, no cyanosis or edema Pulses: 2+ and symmetric Skin: Skin color, texture, turgor normal. No rashes or lesions Lymph nodes: Cervical, supraclavicular, and axillary nodes normal. Neurologic: Alert and oriented X 3, normal strength and tone. Normal symmetric reflexes. Normal coordination and gait    Assessment:    Healthy female exam.      Plan:      ghm utd Check labs  After Visit Summary for Counseling Recommendations    1. Preventative health care See above  - Cytology - PAP( [facility]) - TSH - Lipid panel - CBC with Differential/Platelet - Comprehensive metabolic panel  2. Family planning advice   - Ambulatory referral to Obstetrics / Gynecology

## 2020-02-27 LAB — LIPID PANEL
Cholesterol: 199 mg/dL (ref 0–200)
HDL: 71.4 mg/dL (ref >39.00)
LDL Cholesterol: 104 mg/dL — ABNORMAL HIGH (ref 0–99)
NonHDL: 127.97
Total CHOL/HDL Ratio: 3
Triglycerides: 119 mg/dL (ref 0.0–149.0)
VLDL: 23.8 mg/dL (ref 0.0–40.0)

## 2020-02-27 LAB — COMPREHENSIVE METABOLIC PANEL
ALT: 9 U/L (ref 0–35)
AST: 12 U/L (ref 0–37)
Albumin: 4.4 g/dL (ref 3.5–5.2)
Alkaline Phosphatase: 78 U/L (ref 39–117)
BUN: 11 mg/dL (ref 6–23)
CO2: 25 mEq/L (ref 19–32)
Calcium: 9.8 mg/dL (ref 8.4–10.5)
Chloride: 102 mEq/L (ref 96–112)
Creatinine, Ser: 0.77 mg/dL (ref 0.40–1.20)
GFR: 98.85 mL/min (ref >60.00)
Glucose, Bld: 82 mg/dL (ref 70–99)
Potassium: 4.1 mEq/L (ref 3.5–5.1)
Sodium: 136 mEq/L (ref 135–145)
Total Bilirubin: 0.5 mg/dL (ref 0.2–1.2)
Total Protein: 7.5 g/dL (ref 6.0–8.3)

## 2020-02-27 LAB — CBC WITH DIFFERENTIAL/PLATELET
Basophils Absolute: 0 10*3/uL (ref 0.0–0.1)
Basophils Relative: 0.6 % (ref 0.0–3.0)
Eosinophils Absolute: 0 10*3/uL (ref 0.0–0.7)
Eosinophils Relative: 0.6 % (ref 0.0–5.0)
HCT: 31.2 % — ABNORMAL LOW (ref 36.0–46.0)
Hemoglobin: 9.9 g/dL — ABNORMAL LOW (ref 12.0–15.0)
Lymphocytes Relative: 36.5 % (ref 12.0–46.0)
Lymphs Abs: 2 10*3/uL (ref 0.7–4.0)
MCHC: 31.8 g/dL (ref 30.0–36.0)
MCV: 76.3 fl — ABNORMAL LOW (ref 78.0–100.0)
Monocytes Absolute: 0.4 10*3/uL (ref 0.1–1.0)
Monocytes Relative: 8 % (ref 3.0–12.0)
Neutro Abs: 3 10*3/uL (ref 1.4–7.7)
Neutrophils Relative %: 54.3 % (ref 43.0–77.0)
Platelets: 463 10*3/uL — ABNORMAL HIGH (ref 150.0–400.0)
RBC: 4.09 Mil/uL (ref 3.87–5.11)
RDW: 17.4 % — ABNORMAL HIGH (ref 11.5–15.5)
WBC: 5.4 10*3/uL (ref 4.0–10.5)

## 2020-02-27 LAB — TSH: TSH: 0.89 u[IU]/mL (ref 0.35–4.50)

## 2020-02-29 ENCOUNTER — Other Ambulatory Visit: Payer: Self-pay | Admitting: Family Medicine

## 2020-02-29 DIAGNOSIS — D649 Anemia, unspecified: Secondary | ICD-10-CM

## 2020-03-03 ENCOUNTER — Other Ambulatory Visit: Payer: Self-pay

## 2020-03-03 ENCOUNTER — Ambulatory Visit (HOSPITAL_BASED_OUTPATIENT_CLINIC_OR_DEPARTMENT_OTHER)
Admission: RE | Admit: 2020-03-03 | Discharge: 2020-03-03 | Disposition: A | Discharge status: Home / Self Care | Payer: 59 | Source: Ambulatory Visit | Attending: Family Medicine | Admitting: Family Medicine

## 2020-03-03 DIAGNOSIS — Z1231 Encounter for screening mammogram for malignant neoplasm of breast: Secondary | ICD-10-CM | POA: Diagnosis present

## 2020-03-03 IMAGING — MG DIGITAL SCREENING BILAT W/ TOMO W/ CAD
8 series · 9 of 24 positions shown · non-contrast
Comparison: Previous exam(s).

CLINICAL DATA: Screening.

EXAM:
DIGITAL SCREENING BILATERAL MAMMOGRAM WITH TOMO AND CAD

[R CC synth-2D]
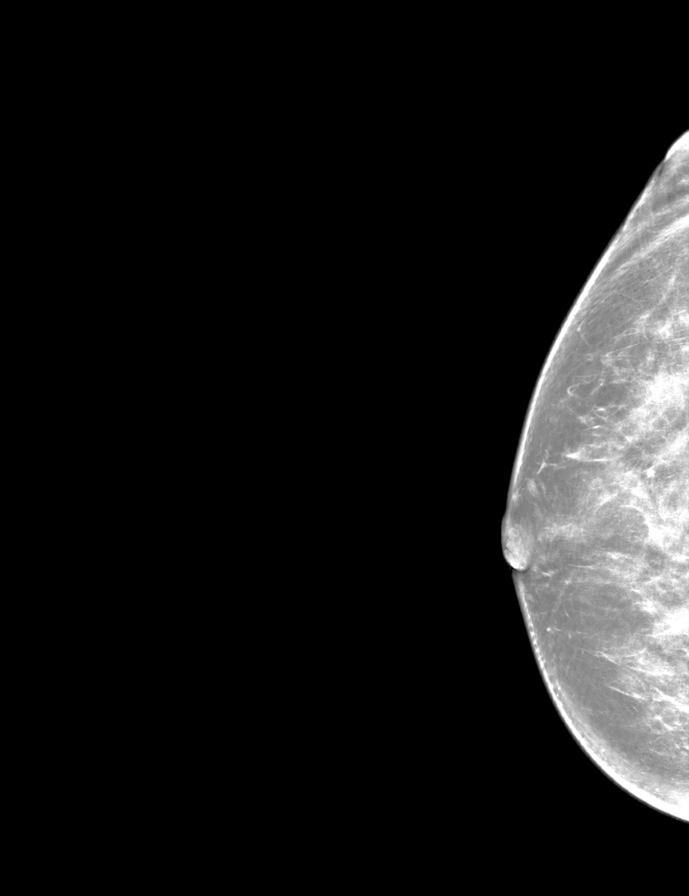

[L MLO synth-2D]
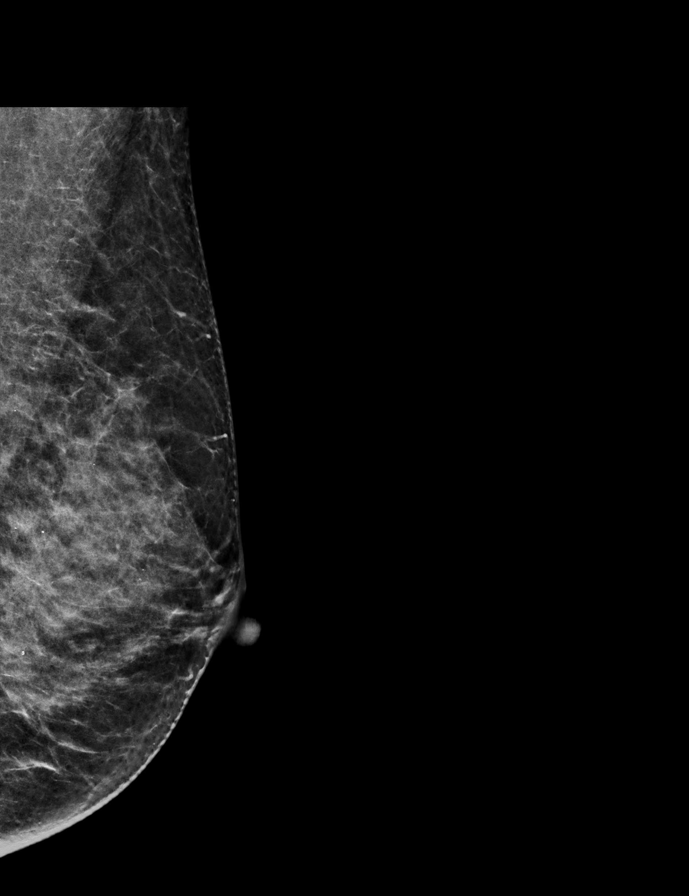

[R MLO synth-2D]
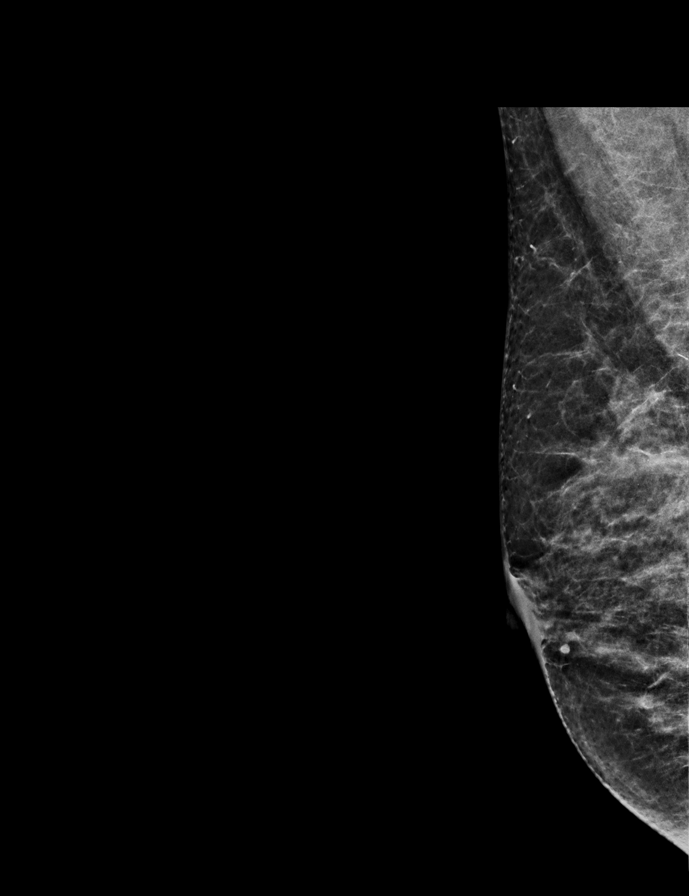

[L CC synth-2D]
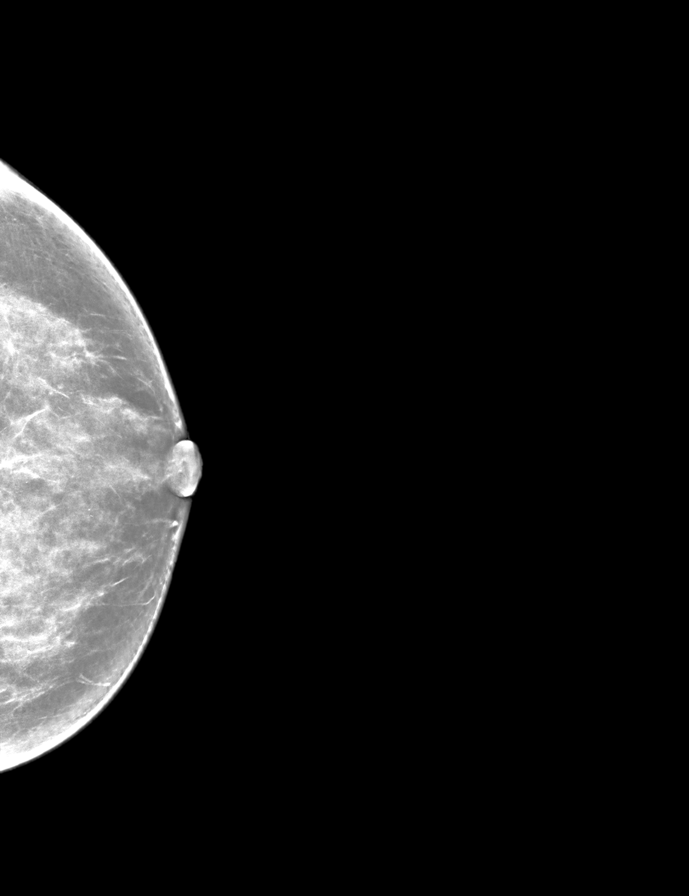

[R MLO tomo · 2 of 54 frames shown]
[frame 18/54]
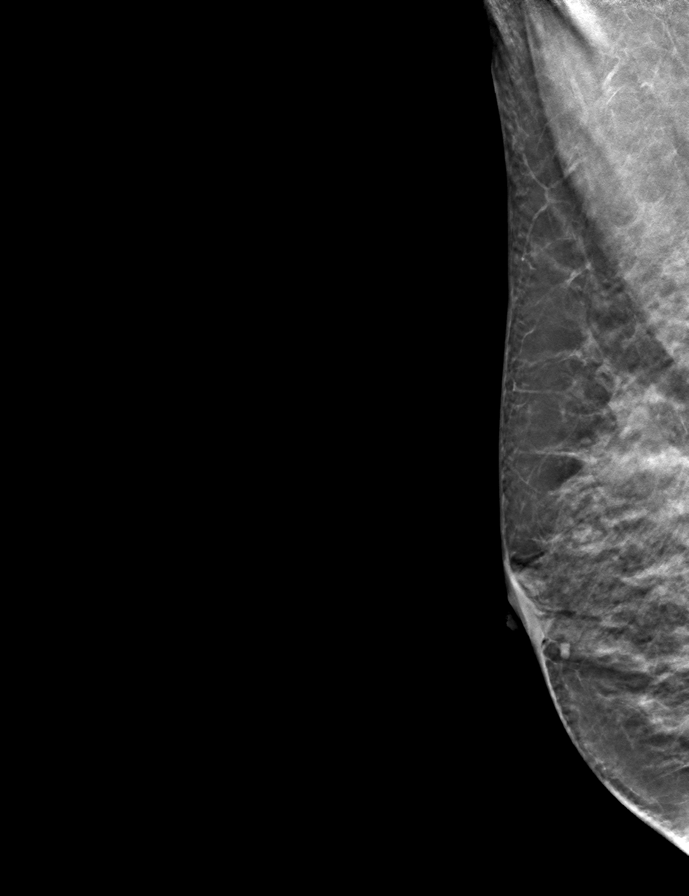
[frame 27/54]
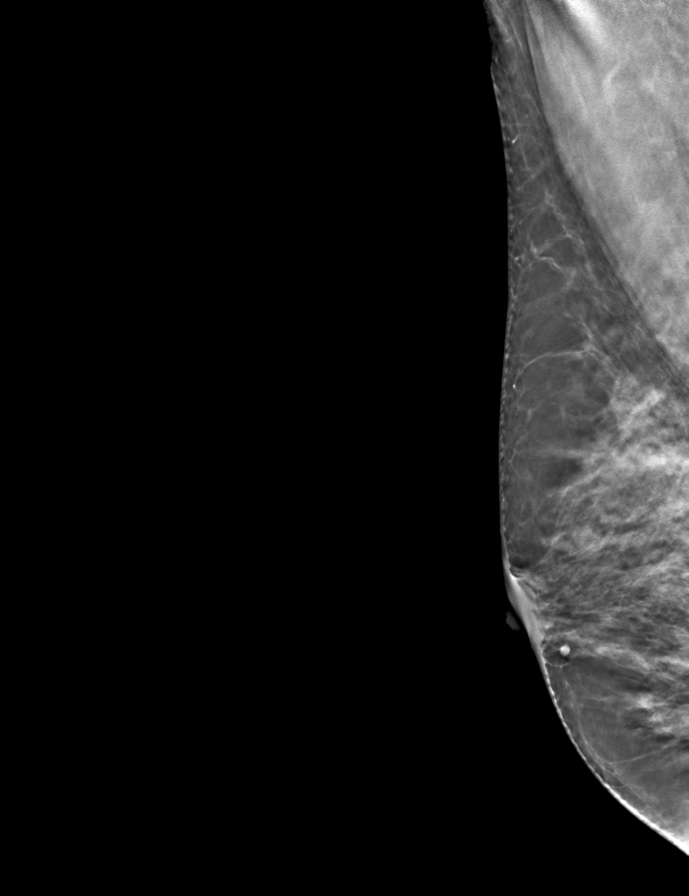

[L MLO tomo · tomo slice 29/57.0]
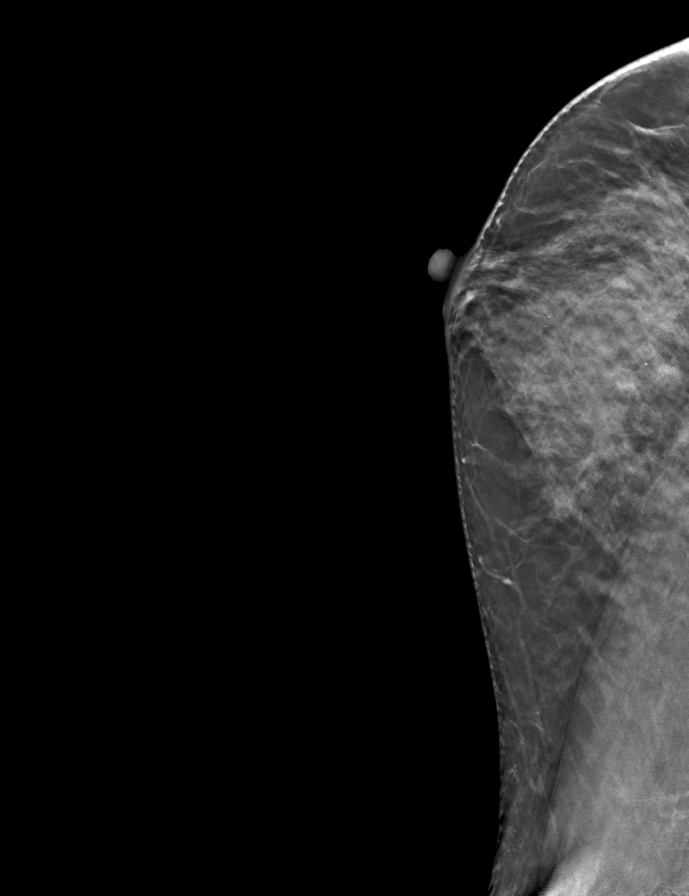

[R CC tomo · tomo slice 35/68.0]
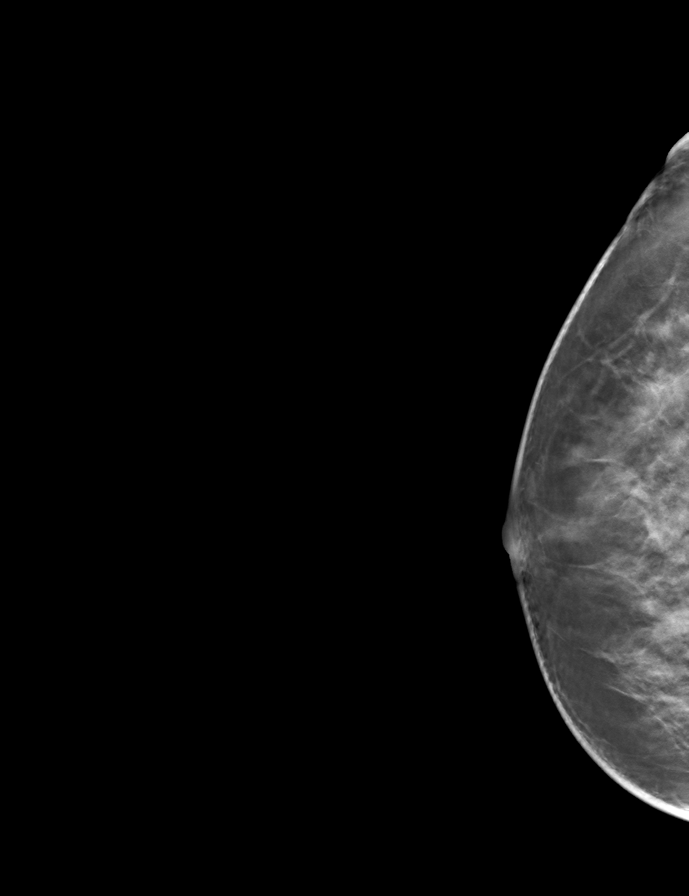

[L CC tomo · tomo slice 33/66.0]
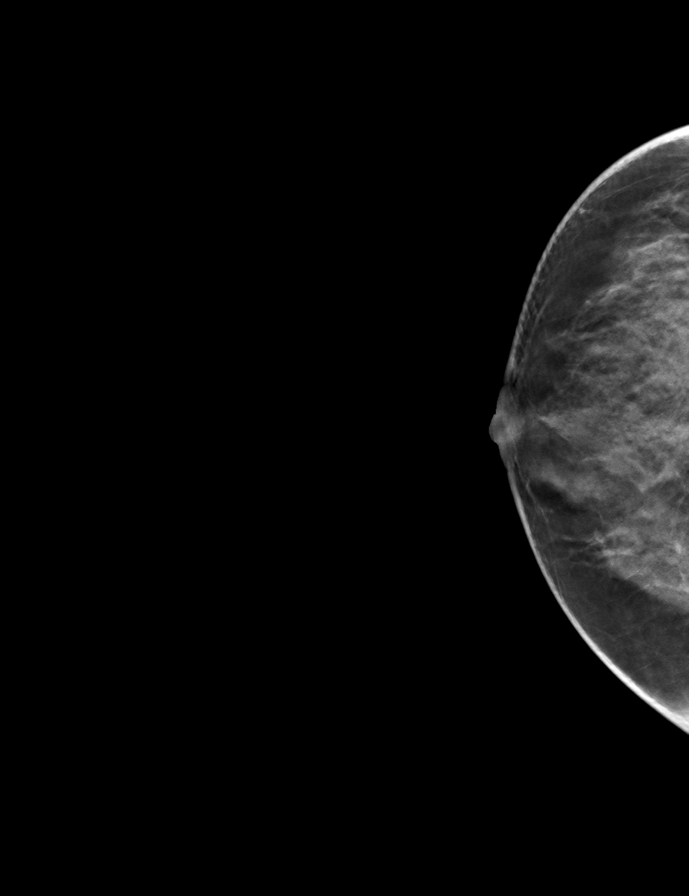

[9 of 24 positions shown; findings below may reference images not displayed]

ACR Breast Density Category c: The breast tissue is heterogeneously
dense, which may obscure small masses.
FINDINGS: There are no findings suspicious for malignancy. Images were
processed with CAD.
IMPRESSION: No mammographic evidence of malignancy. A result letter of this
screening mammogram will be mailed directly to the patient.

RECOMMENDATION:
Screening mammogram in one year. (Code:[5V])

BI-RADS CATEGORY  1: Negative.

## 2020-03-04 LAB — CYTOLOGY - PAP
Chlamydia: NEGATIVE
Comment: NEGATIVE
Comment: NEGATIVE
Comment: NEGATIVE
Comment: NORMAL
Diagnosis: NEGATIVE
HSV1: NEGATIVE
HSV2: POSITIVE — AB
Neisseria Gonorrhea: NEGATIVE
Trichomonas: NEGATIVE

## 2020-03-05 ENCOUNTER — Other Ambulatory Visit: Payer: Self-pay

## 2020-03-05 MED ORDER — VALACYCLOVIR HCL 1 G PO TABS: 1000.0000 mg | ORAL_TABLET | Freq: Every day | ORAL | 3 refills | Status: DC

## 2020-03-10 ENCOUNTER — Other Ambulatory Visit (INDEPENDENT_AMBULATORY_CARE_PROVIDER_SITE_OTHER): Payer: 59

## 2020-03-10 ENCOUNTER — Telehealth: Payer: Self-pay | Admitting: *Deleted

## 2020-03-10 DIAGNOSIS — D649 Anemia, unspecified: Secondary | ICD-10-CM

## 2020-03-10 LAB — FECAL OCCULT BLOOD, IMMUNOCHEMICAL: Fecal Occult Bld: NEGATIVE

## 2020-03-10 NOTE — Telephone Encounter (Signed)
Received call from [facility] lab requesting orders be placed for IFOB that was returned to their lab. Future order placed.

## 2020-04-01 ENCOUNTER — Other Ambulatory Visit: Payer: Self-pay

## 2020-04-01 ENCOUNTER — Other Ambulatory Visit (INDEPENDENT_AMBULATORY_CARE_PROVIDER_SITE_OTHER): Payer: 59

## 2020-04-01 DIAGNOSIS — D649 Anemia, unspecified: Secondary | ICD-10-CM

## 2020-04-01 LAB — CBC WITH DIFFERENTIAL/PLATELET
Basophils Absolute: 0 10*3/uL (ref 0.0–0.1)
Basophils Relative: 0.9 % (ref 0.0–3.0)
Eosinophils Absolute: 0 10*3/uL (ref 0.0–0.7)
Eosinophils Relative: 0.7 % (ref 0.0–5.0)
HCT: 38.6 % (ref 36.0–46.0)
Hemoglobin: 12.4 g/dL (ref 12.0–15.0)
Lymphocytes Relative: 38.4 % (ref 12.0–46.0)
Lymphs Abs: 1.9 10*3/uL (ref 0.7–4.0)
MCHC: 32.2 g/dL (ref 30.0–36.0)
MCV: 86.4 fl (ref 78.0–100.0)
Monocytes Absolute: 0.4 10*3/uL (ref 0.1–1.0)
Monocytes Relative: 7.3 % (ref 3.0–12.0)
Neutro Abs: 2.6 10*3/uL (ref 1.4–7.7)
Neutrophils Relative %: 52.7 % (ref 43.0–77.0)
Platelets: 302 10*3/uL (ref 150.0–400.0)
RBC: 4.47 Mil/uL (ref 3.87–5.11)
RDW: 26 % — ABNORMAL HIGH (ref 11.5–15.5)
WBC: 4.9 10*3/uL (ref 4.0–10.5)

## 2020-04-01 LAB — IBC + FERRITIN
Ferritin: 22.7 ng/mL (ref 10.0–291.0)
Iron: 55 ug/dL (ref 42–145)
Saturation Ratios: 14.4 % — ABNORMAL LOW (ref 20.0–50.0)
Transferrin: 272 mg/dL (ref 212.0–360.0)

## 2020-04-16 ENCOUNTER — Encounter: Payer: Self-pay | Admitting: Obstetrics & Gynecology

## 2020-04-16 ENCOUNTER — Ambulatory Visit (INDEPENDENT_AMBULATORY_CARE_PROVIDER_SITE_OTHER): Payer: 59 | Admitting: Obstetrics & Gynecology

## 2020-04-16 ENCOUNTER — Other Ambulatory Visit: Payer: Self-pay

## 2020-04-16 VITALS — BP 102/69 | HR 47 | Ht 68.0 in | Wt 143.0 lb

## 2020-04-16 DIAGNOSIS — Z3189 Encounter for other procreative management: Secondary | ICD-10-CM | POA: Diagnosis not present

## 2020-04-16 DIAGNOSIS — Z3009 Encounter for other general counseling and advice on contraception: Secondary | ICD-10-CM

## 2020-04-16 NOTE — Progress Notes (Signed)
History:  44 y.o. G0P0000 here today for 28 cycles; sexually active for 6 months with no pregnancies. Pt wants to know if she is able to conceive. She is undecided on her desire to conceive or not. Her partner has never fathered a child. They are both healthy. Pt has a h/o STIs in her 56's. She was last on contraception in her 15's. She did get a rx for OCPs from her primary care provider but, she did not get it filled.  She does not consider herself 'planning to get pregnant.'  The following portions of the patient's history were reviewed and updated as appropriate: allergies, current medications, past family history, past medical history, past social history, past surgical history and problem list.  Review of Systems:  Pertinent items are noted in HPI.    Objective:  Physical Exam Blood pressure 102/69, pulse (!) 47, height 5\' 8"  (1.727 m), weight 143 lb (64.9 kg), last menstrual period 04/11/2020.  CONSTITUTIONAL: Well-developed, well-nourished female in no acute distress.  HENT:  Normocephalic, atraumatic EYES: Conjunctivae and EOM are normal. No scleral icterus.  NECK: Normal range of motion SKIN: Skin is warm and dry. No rash noted. Not diaphoretic.No pallor. [place]: Alert and oriented to person, place, and time. Normal coordination.    Assessment & Plan:  Contraception counseling vs fertility eval/concerns  I spent much of the visit reviewing the fecundity rate and the chances and risk of conceiving after age 28 years. We  Discussed the ddx of infertility from the female and female pts and reviewed the steps for a infertility workup which we can begin at any time based on her age. I would recommend starting with an [facility] and AMH level and an HSG on the pt and a semen analysis for her partner.   Pt will take note of the info and discuss with her partner. I have also recommend taking PNVs if she elects NOT to be on contraception.   Total face-to-face time with patient was 30 min.   Greater than 50% was spent in counseling and coordination of care with the patient.   Hunter Pinkard L. Harraway-Smith, M.D., [place]lynn June

## 2020-04-16 NOTE — Patient Instructions (Signed)
Hysterosalpingography  Hysterosalpingography is a procedure in which a woman's uterus and fallopian tubes are examined. During this procedure, contrast dye is injected into the uterus through the vagina and cervix. X-rays are then taken. The dye makes the uterus and fallopian tubes show up clearly on the X-rays. This procedure may be done:  To help determine whether there are tumors, scars (adhesions), or other abnormalities in the uterus.  To find out why a woman is unable to have children (infertile).  To make sure the fallopian tubes are completely blocked a few months after having certain tubal sterilization procedures. Tell a health care provider about:  Any allergies you have.  All medicines you are taking, including vitamins, herbs, eye drops, creams, and over-the-counter medicines.  Any problems you or family members have had with the use of anesthetic medicines.  Any blood disorders you have.  Any surgeries you have had.  Any medical conditions you have.  Whether you are pregnant or may be pregnant. What are the risks? Generally, this is a safe procedure. However, problems may occur, including:  Infection in the lining of the uterus (endometritis) or fallopian tubes (salpingitis).  Allergic reaction to medicines or dyes.  Risk of making a hole (perforation) in the uterus or fallopian tubes.  Damage to other structures or organs. What happens before the procedure?  Schedule the procedure after your menstrual period stops, but before your next ovulation. This is usually between day 5 and day 10 of your last period. Day 1 is the first day of your period.  Ask your health care provider about changing or stopping your regular medicines. This is especially important if you are taking diabetes medicines or blood thinners.  Empty your bladder before the procedure begins.  Plan to have someone take you home from the hospital or clinic. What happens during the  procedure?  You may be given one of the following: ? A medicine to help you relax (sedative). ? An over-the-counter pain medicine.  You will lie down on your back and place your feet into footrests (stirrups).  A device called a speculum will be inserted into your vagina. This allows your health care provider to see inside your vagina through to your cervix.  Your cervix will be washed with a germ-killing soap.  A medicine may be injected into your cervix to numb it (local anesthesia).  A thin, flexible tube will be passed through your cervix into your uterus.  Contrast dye will be passed through the tube and into the uterus. Contrast dye may cause some cramping.  Several X-rays will be taken as the contrast dye spreads through the uterus and into the fallopian tubes.  The tube will be removed. The contrast dye will flow out through your vagina naturally. The procedure may vary among health care providers and hospitals. What happens after the procedure?  Most of the contrast dye will flow out of your vagina naturally. You may want to wear a sanitary pad.  You may have mild cramping and vaginal bleeding. This should go away after a short time.  Do not drive for 24 hours if you were given a sedative.  It is up to you to get the results of your procedure. Ask your health care provider, or the department that is doing the procedure, when your results will be ready. Summary  Hysterosalpingography is a procedure in which a woman's uterus and fallopian tubes are examined.  During this procedure, contrast dye is injected into the uterus  through the vagina and cervix. X-rays are then taken. The dye helps the uterus and fallopian tubes show up clearly on the X-rays.  Schedule the procedure after your menstrual period stops, but before your next ovulation. This is usually between day 5 and day 10 of your last period.  After the procedure, you may have mild cramping and vaginal bleeding.  This should go away after a short time. This information is not intended to replace advice given to you by your health care provider. Make sure you discuss any questions you have with your health care provider. Document Revised: 11/25/2017 Document Reviewed: 01/05/2017 Elsevier Patient Education  [place]. Female Infertility  Female infertility refers to a woman's inability to get pregnant (conceive) after a year of having sex regularly (or after 6 months in women over age 67) without using birth control. Infertility can also mean that a woman is not able to carry a pregnancy to full term. Both women and men can have fertility problems. What are the causes? This condition may be caused by:  Problems with reproductive organs. Infertility can result if a woman: ? Has an abnormally short cervix or a cervix that does not remain closed during a pregnancy. ? Has a blockage or scarring in the fallopian tubes. ? Has an abnormally shaped uterus. ? Has uterine fibroids. This is a benign mass of tissue or muscle (tumor) that can develop in the uterus. ? Is not ovulating in a regular way.  Certain medical conditions. These may include: ? Polycystic ovary syndrome (PCOS). This is a hormonal disorder that can cause small cysts to grow on the ovaries. This is the most common cause of infertility in women. ? Endometriosis. This is a condition in which the tissue that lines the uterus (endometrium) grows outside of its normal location. ? Cancer and cancer treatments, such as chemotherapy or radiation. ? Premature ovarian failure. This is when ovaries stop producing eggs and hormones before age 58. ? Sexually transmitted diseases, such as chlamydia or gonorrhea. ? Autoimmune disorders. These are disorders in which the body's defense system (immune system) attacks normal, healthy cells. Infertility can be linked to more than one cause. For some women, the cause of infertility is not known  (unexplained infertility). What increases the risk?  Age. A woman's fertility declines with age, especially after her mid-50s.  Being underweight or overweight.  Drinking too much alcohol.  Using drugs such as anabolic steroids, cocaine, and marijuana.  Exercising excessively.  Being exposed to environmental toxins, such as radiation, pesticides, and certain chemicals. What are the signs or symptoms? The main sign of infertility in women is the inability to get pregnant or carry a pregnancy to full term. How is this diagnosed? This condition may be diagnosed by:  Checking whether you are ovulating each month. The tests may include: ? Blood tests to check hormone levels. ? An ultrasound of the ovaries. ? Taking a small tissue that lines the uterus and checking it under a microscope (endometrial biopsy).  Doing additional tests. This is done if ovulation is normal. Tests may include: ? Hysterosalpingography. This X-ray test can show the shape of the uterus and whether the fallopian tubes are open. ? Laparoscopy. This test uses a lighted tube (laparoscope) to look for problems in the fallopian tubes and other organs. ? Transvaginal ultrasound. This imaging test is used to check for abnormalities in the uterus and ovaries. ? Hysteroscopy. This test uses a lighted tube to check for problems in  the cervix and the uterus. To be diagnosed with infertility, both partners will have a physical exam. Both partners will also have an extensive medical and sexual history taken. Additional tests may be done. How is this treated? Treatment depends on the cause of infertility. Most cases of infertility in women are treated with medicine or surgery.  Women may take medicine to: ? Correct ovulation problems. ? Treat other health conditions.  Surgery may be done to: ? Repair damage to the ovaries, fallopian tubes, cervix, or uterus. ? Remove growths from the uterus. ? Remove scar tissue from the  uterus, pelvis, or other organs. Assisted reproductive technology (ART) Assisted reproductive technology (ART) refers to all treatments and procedures that combine eggs and sperm outside the body to try to help a couple conceive. ART is often combined with fertility drugs to stimulate ovulation. Sometimes ART is done using eggs retrieved from another woman's body (donor eggs) or from previously frozen fertilized eggs (embryos). There are different types of ART. These include:  Intrauterine insemination (IUI). A long, thin tube is used to place sperm directly into a woman's uterus. This procedure: ? Is effective for infertility caused by sperm problems, including low sperm count and low motility. ? Can be used in combination with fertility drugs.  In vitro fertilization (IVF). This is done when a woman's fallopian tubes are blocked or when a man has low sperm count. In this procedure: ? Fertility drugs are used to stimulate the ovaries to produce multiple eggs. ? Once mature, these eggs are removed from the body and combined with the sperm to be fertilized. ? The fertilized eggs are then placed into the woman's uterus. Follow these instructions at home:  Take over-the-counter and prescription medicines only as told by your health care provider.  Do not use any products that contain nicotine or tobacco, such as cigarettes and e-cigarettes. If you need help quitting, ask your health care provider.  If you drink alcohol, limit how much you have to 1 drink a day.  Make dietary changes to lose weight or maintain a healthy weight. Work with your health care provider and a dietitian to set a weight-loss goal that is healthy and reasonable for you.  Seek support from a counselor or support group to talk about your concerns related to infertility. Couples counseling may be helpful for you and your partner.  Practice stress reduction techniques that work well for you, such as regular physical activity,  meditation, or deep breathing.  Keep all follow-up visits as told by your health care provider. This is important. Contact a health care provider if you:  Feel that stress is interfering with your life and relationships.  Have side effects from treatments for infertility. Summary  Female infertility refers to a woman's inability to get pregnant (conceive) after a year of having sex regularly (or after 6 months in women over age 71) without using birth control.  To be diagnosed with infertility, both partners will have a physical exam. Both partners will also have an extensive medical and sexual history taken.  Seek support from a counselor or support group to talk about your concerns related to infertility. Couples counseling may be helpful for you and your partner. This information is not intended to replace advice given to you by your health care provider. Make sure you discuss any questions you have with your health care provider. Document Revised: 04/05/2019 Document Reviewed: 11/14/2017 Elsevier Patient Education  [place]. Semen Analysis Test Why am  I having this test? A semen analysis test is done to check certain aspects of the health of a man's reproductive organs (testes) and the hormone system that plays a role in semen production. Semen is a whitish secretion that is released from the penis during the final phase of orgasm (ejaculation). It is made up of liquids and nutrients from the prostate gland, seminal vesicles, and other glands. It also contains sperm cells from the testes. A single sperm cell contains one complete set of a man's genetic coding (chromosomes). You may have this test as a part of infertility testing, which is done to help find out reasons for the inability to have a child. The test may also be done to determine whether a previously performed vasectomy was successful. A vasectomy is a procedure done to make a man permanently infertile by tying the tube  (the vas deferens) that collects the sperm from the testicle. A vasectomy blocks the sperm from going through the vas deferens and penis so that the sperm will not go into the vagina during sexual intercourse. What is being tested? If the test is done as part of infertility testing, the shape (morphology), size, and movement (motility) of sperm cells will be included in the analysis. This testing may also include assessing a sperm cell's ability to penetrate an egg (fertilize) as well as the formation of genetic material (DNA). If the test is done to check the success of a vasectomy, the sample will be checked for the presence of sperm. What kind of sample is taken? A semen sample is required for this test. A semen sample will be collected by ejaculation into a sterile glass or plastic container provided by the lab. This can be done at home, in your health care provider's office, or in the lab. How do I collect samples at home?  If the sample will be collected at home, follow your health care provider's instructions about how to collect the sample. The sample should be delivered to the lab within 1 hour after collection. It should also be protected from extreme heat or cold. How do I prepare for this test? For infertility testing: Avoid sexual activity for 2-3 days before the semen sample collection. However, do not avoid ejaculation for a prolonged period because this can alter the motility of sperm cells. For vasectomy success testing: Make sure that you ejaculate one or two times before the day of semen sample collection. This will clear the vas deferens of any sperm that were present before the vasectomy was performed. Tell a health care provider about:  All medicines you are taking, including vitamins, herbs, eye drops, creams, and over-the-counter medicines. How are the results reported? Your test results will be reported as values. Your health care provider will compare your results to  normal ranges that were established after testing a large group of people (reference values). Reference values may vary among labs and hospitals. For this test, common reference values are:  Volume: 2-5 mL.  Liquefaction time: 20-30 minutes after collection.  Appearance: normal (whitish in color).  Motile/mL: greater than or equal to 10 million.  Sperm/mL: greater than or equal to 20 million.  Viscosity: greater than or equal to 3.  Agglutination: greater than or equal to 3.  Supravital: greater than or equal to 75% live.  Fructose: positive.  pH: 7.12-8.  Sperm count (density): greater than or equal to 20 million/mL.  Sperm motility: greater than or equal to 50% at 1 hour.  Sperm morphology: greater than 30% Tyrell Antonio criteria greater than 14%) normally shaped. What do the results mean? Low sperm count, abnormal motility, or abnormal morphology of sperm cells may all cause problems with female fertility. Abnormal results can also be caused by:  Certain infectious diseases, such as inflammation of a testis (orchitis) from mumps.  Receiving certain kinds of toxic drugs, such as chemotherapy.  Having testicles that did not develop normally in childhood. When the semen analysis test is done to check the success of a vasectomy, the presence of sperm may mean that the surgery was not successful. Your health care provider may suggest a repeat of this test. Talk with your health care provider about what your results mean. Questions to ask your health care provider Ask your health care provider, or the department that is doing the test:  When will my results be ready?  How will I get my results?  What are my treatment options?  What other tests do I need?  What are my next steps? Summary  A semen analysis test is done to check certain aspects of the health of a man's reproductive organs (testes) and the hormone system that plays a role in semen production.  You may have this  test as a part of infertility testing, which is done to help find out reasons for the inability to have a child. The test may also be done to determine whether a previously performed vasectomy was successful.  A semen sample will be collected by ejaculation into a sterile glass or plastic container. Follow instructions about how to collect the sample, and make sure you deliver the sample to the lab within 1 hour of obtaining it.  Talk with your health care provider about what your results mean. This information is not intended to replace advice given to you by your health care provider. Make sure you discuss any questions you have with your health care provider. Document Revised: 09/08/2017 Document Reviewed: 09/08/2017 Elsevier Patient Education  [place].

## 2020-05-07 ENCOUNTER — Encounter: Payer: Self-pay | Admitting: Family Medicine

## 2020-05-09 ENCOUNTER — Other Ambulatory Visit: Payer: Self-pay | Admitting: Obstetrics & Gynecology

## 2020-05-09 MED ORDER — PHEXXI 1.8-1-0.4 % VA GEL: 1.0000 "application " | Freq: Every day | VAGINAL | 3 refills | Status: DC

## 2020-05-15 ENCOUNTER — Telehealth: Payer: Self-pay

## 2020-05-15 NOTE — Telephone Encounter (Signed)
Patient called and made aware that her insurance will not cover Phexxi. Kathrene Alu RN

## 2020-05-15 NOTE — Telephone Encounter (Signed)
-----   Message from Lavonia Drafts, MD sent at 05/14/2020  1:52 PM EDT ----- Regarding: RE: Rx problem Please all pt. This is what she requested. Please give her this info and ask her how she wishes to proceed.   Thx,  Clh-S   ----- Message ----- From: [name], CMA Sent: 05/14/2020   8:53 AM EDT To: Lavonia Drafts, MD Subject: Rx problem                                     Pt's insurance does not cover the PHEXXI vaginal gel. The pharmacy is not sure which one is covered they state that the medication is nonformulary.

## 2020-05-27 ENCOUNTER — Other Ambulatory Visit: Payer: Self-pay | Admitting: Obstetrics & Gynecology

## 2020-05-27 DIAGNOSIS — Z3009 Encounter for other general counseling and advice on contraception: Secondary | ICD-10-CM

## 2020-05-27 MED ORDER — NORETHIN ACE-ETH ESTRAD-FE 1-20 MG-MCG(24) PO TABS: 1.0000 | ORAL_TABLET | Freq: Every day | ORAL | 11 refills | Status: DC

## 2020-11-27 ENCOUNTER — Telehealth: Payer: Self-pay

## 2020-11-27 NOTE — Telephone Encounter (Signed)
Spoke with patient regarding symptoms. Patient states after speaking with triage nurse, she will manage symptoms at home. Declines appointment at this time.    [facility] [place] [facility] - Client TELEPHONE ADVICE RECORD [facility] Patient Name: Jeanne Davis Gender: Female DOB: February 26, 1976 Age: 44 Y 22 D Return Phone Number: [phone] (Primary) Address: City/State/Zip: [place] [address] Client [facility] [place] [facility] - Client Client Site [facility] [place] - Night Physician Jeanne Davis- MD Contact Type Call Who Is Calling Patient / Member / Family / Caregiver Call Type Triage / Clinical Relationship To Patient Self Return Phone Number [phone] (Primary) Chief Complaint Sore Throat Reason for Call Request to Reschedule Office Appointment Initial Comment [name] states she is requesting a sick visit. [name] states she has congestion and coughing up large amounts of phlegm. [name] states she has sore throat and hoarseness. [name] states she has a lot of chest congestion. Provided caller with office hours to schedule appt. Translation No Nurse Assessment Nurse: Jeanne Crigler, RN, Jeanne Davis Date/Time (Eastern Time): 11/27/2020 7:46:14 AM Confirm and document reason for call. If symptomatic, describe symptoms. ---[name] states she is requesting a sick visit. [name] states has head and congestion, coughing up large amounts of phlegm, sore throat, hoarseness. Mostly clear mucous. Denies fever. Symptoms x3 Does the patient have any new or worsening symptoms? ---Yes Will a triage be completed? ---Yes Related visit to physician within the last 2 weeks? ---No Does the PT have any chronic conditions? (i.e. diabetes, asthma, this includes High risk factors for pregnancy, etc.) ---No Is the patient pregnant or possibly pregnant? (Ask all females between the ages of 59-55) ---No Is this a behavioral health or substance abuse call?  ---No Guidelines Guideline Title Affirmed Question Affirmed Notes Nurse Date/Time (Eastern Time) Common Cold Common cold with no complications Hearon, RN, Jeanne Davis 11/27/2020 7:48:30 AM Disp. Time Eilene Ghazi Time) Disposition Final User 11/27/2020 7:54:54 AM Home Care Yes Hearon, RN, Jeanne Davis PLEASE NOTE: All timestamps contained within this report are represented as [place] Standard Time. CONFIDENTIALTY NOTICE: This fax transmission is intended only for the addressee. It contains information that is legally privileged, confidential or otherwise protected from use or disclosure. If you are not the intended recipient, you are strictly prohibited from reviewing, disclosing, copying using or disseminating any of this information or taking any action in reliance on or regarding this information. If you have received this fax in error, please notify us immediately by telephone so that we can arrange for its return to [place]. Phone: [phone], Toll-Free: [phone], Fax: [phone] Page: 2 of 2 Call Id: [id number] Nettie Disagree/Comply Comply [name] Understands Yes PreDisposition Did not know what to do Care Advice Given Per Guideline HOME CARE: * You should be able to treat this at home. * It sounds like an uncomplicated cold that we can treat at home. * Colds are very common and may make you feel uncomfortable. EXPECTED COURSE: * Nasal discharge 7 to 14 days * Cough 2 to 3 weeks. CARE ADVICE given per Common Cold (Adult) guideline. * You become worse * Runny nose lasts over 10 days CALL BACK IF: * You become short of breath HUMIDIFIER: * Dry air makes coughs worse. * If the air is dry, use a humidifier in the bedroom

## 2020-11-27 NOTE — Telephone Encounter (Signed)
May be beneficial to let the pt know if they want to have a sick visit --- they will need to do a covid test and have a neg test before they can come in

## 2020-12-02 ENCOUNTER — Telehealth (INDEPENDENT_AMBULATORY_CARE_PROVIDER_SITE_OTHER): Payer: 59 | Admitting: Internal Medicine

## 2020-12-02 ENCOUNTER — Encounter: Payer: Self-pay | Admitting: Internal Medicine

## 2020-12-02 ENCOUNTER — Other Ambulatory Visit: Payer: Self-pay

## 2020-12-02 VITALS — Ht 68.0 in | Wt 145.0 lb

## 2020-12-02 DIAGNOSIS — J069 Acute upper respiratory infection, unspecified: Secondary | ICD-10-CM | POA: Diagnosis not present

## 2020-12-02 MED ORDER — BENZONATATE 200 MG PO CAPS: 200.0000 mg | ORAL_CAPSULE | Freq: Three times a day (TID) | ORAL | 0 refills | Status: DC | PRN

## 2020-12-02 MED ORDER — AMOXICILLIN 875 MG PO TABS: 875.0000 mg | ORAL_TABLET | Freq: Two times a day (BID) | ORAL | 0 refills | Status: DC

## 2020-12-02 NOTE — Progress Notes (Signed)
Pre visit review using our clinic review tool, if applicable. No additional management support is needed unless otherwise documented below in the visit note. 

## 2020-12-02 NOTE — Progress Notes (Signed)
   Subjective:    Patient ID: Jeanne Davis, female    DOB: 06-18-1976, 44 y.o.   MRN: [id number]  DOS:  12/02/2020 Type of visit - description: Virtual Visit via Video Note  I connected with the above patient  by a video enabled telemedicine application and verified that I am speaking with the correct person using two identifiers.   THIS ENCOUNTER IS A VIRTUAL VISIT DUE TO COVID-19 - PATIENT WAS NOT SEEN IN THE OFFICE. PATIENT HAS CONSENTED TO VIRTUAL VISIT / TELEMEDICINE VISIT   Location of patient: home  Location of provider: office  Persons participating in the virtual visit: patient, provider   I discussed the limitations of evaluation and management by telemedicine and the availability of in person appointments. The patient expressed understanding and agreed to proceed.  Acute Symptoms started few days ago: Chest and sinus congestion, cough mostly at night, sometimes unable to sleep. A very small amount of nasal discharge &  sputum noted, yellowish in color. Using Mucinex, Sudafed.  She is very healthy, no history of hypertension, asthma or heart disease. Denies chest pain or difficulty breathing. No wheezing.  Review of Systems See above   No past medical history on file.  Past Surgical History:  Procedure Laterality Date  . WISDOM TOOTH EXTRACTION      Allergies as of 12/02/2020   No Known Allergies     Medication List       Accurate as of December 02, 2020  4:31 PM. If you have any questions, ask your nurse or doctor.        Norethindrone Acetate-Ethinyl Estrad-FE 1-20 MG-MCG(24) tablet Commonly known as: LOESTRIN 24 FE Take 1 tablet by mouth daily.   Phexxi 1.8-1-0.4 % Gel Generic drug: Lactic Ac-Citric Ac-Pot Bitart Place 1 application vaginally daily. May use 2-3 times per week.   valACYclovir 1000 MG tablet Commonly known as: VALTREX Take 1 tablet (1,000 mg total) by mouth daily.          Objective:   Physical Exam Ht 5\' 8"  (1.727 m)    Wt 145 lb (65.8 kg)   BMI 22.05 kg/m  This is a virtual video visit, she looked well, alert oriented x3, in no distress, no cough noted.    Assessment    44 year old female, healthy, not a smoker, had COVID vaccination including a booster, had a flu shot already, on birth control pills with good compliance, presents with:  URI, possibly bronchitis: Symptoms as described above, in addition to Mucinex & Sudafed I encouraged rest, fluids, Tylenol, Tessalon Perles, and amoxicillin. If not better in few days she will let us know, will need to be tested for Covid. Detailed instructions sent via message   I discussed the assessment and treatment plan with the patient. The patient was provided an opportunity to ask questions and all were answered. The patient agreed with the plan and demonstrated an understanding of the instructions.   The patient was advised to call back or seek an in-person evaluation if the symptoms worsen or if the condition fails to improve as anticipated.

## 2020-12-11 ENCOUNTER — Telehealth: Payer: Self-pay | Admitting: Family Medicine

## 2020-12-11 NOTE — Telephone Encounter (Signed)
Patient states she was prescribed amoxicillin. She took her last pill on Tuesday an she has develop a rash all over.

## 2020-12-11 NOTE — Telephone Encounter (Signed)
Pt had a virtual on 12/02/20 with Dr. Larose Kells. Please advise

## 2020-12-11 NOTE — Telephone Encounter (Signed)
May need pred --- should have ov if other symptoms gone and no covid symptoms

## 2020-12-12 NOTE — Telephone Encounter (Signed)
Spoke with patient. Pt states she took OTC Benadryl and the rash almost gone. Pt states having left knee swelling. Pt put on the schedule next week

## 2020-12-16 ENCOUNTER — Ambulatory Visit: Payer: 59 | Admitting: Family Medicine

## 2020-12-16 ENCOUNTER — Other Ambulatory Visit: Payer: Self-pay

## 2020-12-16 ENCOUNTER — Encounter: Payer: Self-pay | Admitting: Family Medicine

## 2020-12-16 VITALS — BP 108/68 | HR 78 | Temp 98.6°F | Ht 68.0 in | Wt 147.2 lb

## 2020-12-16 DIAGNOSIS — M6752 Plica syndrome, left knee: Secondary | ICD-10-CM

## 2020-12-16 MED ORDER — MELOXICAM 15 MG PO TABS: 15.0000 mg | ORAL_TABLET | Freq: Every day | ORAL | 0 refills | Status: DC

## 2020-12-16 NOTE — Patient Instructions (Signed)
Ice/cold pack over area for 10-15 min twice daily.  OK to take Tylenol 1000 mg (2 extra strength tabs) or 975 mg (3 regular strength tabs) every 6 hours as needed.  Heat is OK over the quad.  Let us know if you need anything.  Knee Exercises It is normal to feel mild stretching, pulling, tightness, or discomfort as you do these exercises, but you should stop right away if you feel sudden pain or your pain gets worse. STRETCHING AND RANGE OF MOTION EXERCISES  These exercises warm up your muscles and joints and improve the movement and flexibility of your knee. These exercises also help to relieve pain, numbness, and tingling. Exercise A: Knee Extension, Prone  1. Lie on your abdomen on a bed. 2. Place your left / right knee just beyond the edge of the surface so your knee is not on the bed. You can put a towel under your left / right thigh just above your knee for comfort. 3. Relax your leg muscles and allow gravity to straighten your knee. You should feel a stretch behind your left / right knee. 4. Hold this position for 30 seconds. 5. Scoot up so your knee is supported between repetitions. Repeat 2 times. Complete this stretch 3 times per week. Exercise B: Knee Flexion, Active    1. Lie on your back with both knees straight. If this causes back discomfort, bend your left / right knee so your foot is flat on the floor. 2. Slowly slide your left / right heel back toward your buttocks until you feel a gentle stretch in the front of your knee or thigh. 3. Hold this position for 30 seconds. 4. Slowly slide your left / right heel back to the starting position. Repeat 2 times. Complete this exercise 3 times per week. Exercise C: Quadriceps, Prone    1. Lie on your abdomen on a firm surface, such as a bed or padded floor. 2. Bend your left / right knee and hold your ankle. If you cannot reach your ankle or pant leg, loop a belt around your foot and grab the belt instead. 3. Gently pull  your heel toward your buttocks. Your knee should not slide out to the side. You should feel a stretch in the front of your thigh and knee. 4. Hold this position for 30 seconds. Repeat 2 times. Complete this stretch 3 times per week. Exercise D: Hamstring, Supine  1. Lie on your back. 2. Loop a belt or towel over the ball of your left / right foot. The ball of your foot is on the walking surface, right under your toes. 3. Straighten your left / right knee and slowly pull on the belt to raise your leg until you feel a gentle stretch behind your knee. ? Do not let your left / right knee bend while you do this. ? Keep your other leg flat on the floor. 4. Hold this position for 30 seconds. Repeat 2 times. Complete this stretch 3 times per week. STRENGTHENING EXERCISES  These exercises build strength and endurance in your knee. Endurance is the ability to use your muscles for a long time, even after they get tired. Exercise E: Quadriceps, Isometric    1. Lie on your back with your left / right leg extended and your other knee bent. Put a rolled towel or small pillow under your knee if told by your health care provider. 2. Slowly tense the muscles in the front of your left /  right thigh. You should see your kneecap slide up toward your hip or see increased dimpling just above the knee. This motion will push the back of the knee toward the floor. 3. For 3 seconds, keep the muscle as tight as you can without increasing your pain. 4. Relax the muscles slowly and completely. Repeat for 10 total reps Repeat 2 ti mes. Complete this exercise 3 times per week. Exercise F: Straight Leg Raises - Quadriceps  1. Lie on your back with your left / right leg extended and your other knee bent. 2. Tense the muscles in the front of your left / right thigh. You should see your kneecap slide up or see increased dimpling just above the knee. Your thigh may even shake a bit. 3. Keep these muscles tight as you raise your  leg 4-6 inches (10-15 cm) off the floor. Do not let your knee bend. 4. Hold this position for 3 seconds. 5. Keep these muscles tense as you lower your leg. 6. Relax your muscles slowly and completely after each repetition. 10 total reps. Repeat 2 times. Complete this exercise 3 times per week.  Exercise G: Hamstring Curls    If told by your health care provider, do this exercise while wearing ankle weights. Begin with 5 lb weights (optional). Then increase the weight by 1 lb (0.5 kg) increments. Do not wear ankle weights that are more than 20 lbs to start with. 1. Lie on your abdomen with your legs straight. 2. Bend your left / right knee as far as you can without feeling pain. Keep your hips flat against the floor. 3. Hold this position for 3 seconds. 4. Slowly lower your leg to the starting position. Repeat for 10 reps.  Repeat 2 times. Complete this exercise 3 times per week. Exercise H: Squats (Quadriceps)  1. Stand in front of a table, with your feet and knees pointing straight ahead. You may rest your hands on the table for balance but not for support. 2. Slowly bend your knees and lower your hips like you are going to sit in a chair. ? Keep your weight over your heels, not over your toes. ? Keep your lower legs upright so they are parallel with the table legs. ? Do not let your hips go lower than your knees. ? Do not bend lower than told by your health care provider. ? If your knee pain increases, do not bend as low. 3. Hold the squat position for 1 second. 4. Slowly push with your legs to return to standing. Do not use your hands to pull yourself to standing. Repeat 2 times. Complete this exercise 3 times per week. Exercise I: Wall Slides (Quadriceps)    1. Lean your back against a smooth wall or door while you walk your feet out 18-24 inches (46-61 cm) from it. 2. Place your feet hip-width apart. 3. Slowly slide down the wall or door until your knees Repeat 2 times. Complete  this exercise every other day. 4. Exercise K: Straight Leg Raises - Hip Abductors  1. Lie on your side with your left / right leg in the top position. Lie so your head, shoulder, knee, and hip line up. You may bend your bottom knee to help you keep your balance. 2. Roll your hips slightly forward so your hips are stacked directly over each other and your left / right knee is facing forward. 3. Leading with your heel, lift your top leg 4-6 inches (10-15 cm). You  should feel the muscles in your outer hip lifting. ? Do not let your foot drift forward. ? Do not let your knee roll toward the ceiling. 4. Hold this position for 3 seconds. 5. Slowly return your leg to the starting position. 6. Let your muscles relax completely after each repetition. 10 total reps. Repeat 2 times. Complete this exercise 3 times per week. Exercise J: Straight Leg Raises - Hip Extensors  1. Lie on your abdomen on a firm surface. You can put a pillow under your hips if that is more comfortable. 2. Tense the muscles in your buttocks and lift your left / right leg about 4-6 inches (10-15 cm). Keep your knee straight as you lift your leg. 3. Hold this position for 3 seconds. 4. Slowly lower your leg to the starting position. 5. Let your leg relax completely after each repetition. Repeat 2 times. Complete this exercise 3 times per week. Document Released: 10/27/2005 Document Revised: 09/06/2016 Document Reviewed: 10/19/2015 Elsevier Interactive Patient Education  2017 [employer].

## 2020-12-16 NOTE — Progress Notes (Signed)
Musculoskeletal Exam  Patient: Jeanne Davis DOB: 06/06/1976  DOS: 12/16/2020  SUBJECTIVE:  Chief Complaint:   Chief Complaint  Patient presents with  . Knee Pain    Left knee     ADIN Jeanne Davis is a 44 y.o.  female for evaluation and treatment of L knee pain.   Onset:  1 week ago. No inj or change in activity.  Location: Front of L pain Character:  stiffness  Progression of issue:  is unchanged Associated symptoms: swelling slight decreased ROM Treatment: to date has been OTC NSAIDS and heat. .   Neurovascular symptoms: no  No known health issues  Objective: VITAL SIGNS: BP 108/68 (BP Location: Left Arm, Patient Position: Sitting, Cuff Size: Normal)   Pulse 78   Temp 98.6 F (37 C) (Oral)   Ht 5\' 8"  (1.727 m)   Wt 147 lb 4 oz (66.8 kg)   SpO2 99%   BMI 22.39 kg/m  Constitutional: Well formed, well developed. No acute distress. Thorax & Lungs: No accessory muscle use Musculoskeletal: L knee.   Normal active range of motion: yes.   Normal passive range of motion: yes Tenderness to palpation: yes over plica of distal fem condyles medially and laterall Deformity: no Ecchymosis: no +soft tiss edema, no effusion Tests positive: none Tests negative: Lachman's, varus/valgus stress, pat app/grind, Stine's Neurologic: Normal sensory function.  Psychiatric: Normal mood. Age appropriate judgment and insight. Alert & oriented x 3.    Assessment:  Synovial plica syndrome of left knee - Plan: meloxicam (MOBIC) 15 MG tablet  Plan: Stretches/exercises, heat, ice, Tylenol. Mobic daily. Sports med vs injection if no improvement.   F/u prn. The patient voiced understanding and agreement to the plan.   Haskell, DO 12/16/20  1:51 PM

## 2020-12-30 ENCOUNTER — Telehealth: Payer: Self-pay | Admitting: Family Medicine

## 2020-12-30 NOTE — Telephone Encounter (Signed)
If patient still having symptoms. She needs another virtual visit.

## 2020-12-30 NOTE — Telephone Encounter (Signed)
Medication: amoxicillin (AMOXIL) 875 MG tablet [[id number]]   Has the patient contacted their pharmacy? No. (If no, request that the patient contact the pharmacy for the refill.) (If yes, when and what did the pharmacy advise?)  Preferred Pharmacy (with phone number or street name): [id number] - [place], [place] - [address]  [address] [place] [address]  Phone:  [phone] Fax:  [phone]  DEA #:  [id number]  Agent: Please be advised that RX refills may take up to 3 business days. We ask that you follow-up with your pharmacy.

## 2020-12-31 ENCOUNTER — Encounter: Payer: Self-pay | Admitting: Family Medicine

## 2020-12-31 ENCOUNTER — Telehealth (INDEPENDENT_AMBULATORY_CARE_PROVIDER_SITE_OTHER): Payer: 59 | Admitting: Family Medicine

## 2020-12-31 ENCOUNTER — Other Ambulatory Visit: Payer: Self-pay

## 2020-12-31 DIAGNOSIS — J069 Acute upper respiratory infection, unspecified: Secondary | ICD-10-CM | POA: Diagnosis not present

## 2020-12-31 DIAGNOSIS — J40 Bronchitis, not specified as acute or chronic: Secondary | ICD-10-CM | POA: Diagnosis not present

## 2020-12-31 MED ORDER — FLUTICASONE PROPIONATE 50 MCG/ACT NA SUSP: 2.0000 | Freq: Every day | NASAL | 6 refills | Status: DC

## 2020-12-31 MED ORDER — AZITHROMYCIN 250 MG PO TABS: ORAL_TABLET | ORAL | 0 refills | Status: DC

## 2020-12-31 NOTE — Progress Notes (Signed)
Virtual Visit via Video Note  I connected with Jeanne Davis on 12/31/20 at  8:20 AM EST by a video enabled telemedicine application and verified that I am speaking with the correct person using two identifiers.  Location: Patient: home alone  Provider: home    I discussed the limitations of evaluation and management by telemedicine and the availability of in person appointments. The patient expressed understanding and agreed to proceed.  History of Present Illness: Pt is home c/o fever , aches  , productive cough , cough.  She was tested Friday for covid but does not the results yet  She was taking mucinex  She has not taking anything since Sunday--- she is slowly improving    Observations/Objective: There were no vitals filed for this visit. Pt has no equipment to check vitals ---  Feels feverish Pt is in NAD   Assessment and Plan:  1. Bronchitis Presumed covid -- due to exposure  But pt c/o congestion moving into chest  rx sent for z pak in case she gets worse  - azithromycin (ZITHROMAX Z-PAK) 250 MG tablet; As directed  Dispense: 6 each; Refill: 0  2. Upper respiratory tract infection, unspecified type Can con't with mucinex if needed  flonase for nasal congestion  - fluticasone (FLONASE) 50 MCG/ACT nasal spray; Place 2 sprays into both nostrils daily.  Dispense: 16 g; Refill: 6  Follow Up Instructions:    I discussed the assessment and treatment plan with the patient. The patient was provided an opportunity to ask questions and all were answered. The patient agreed with the plan and demonstrated an understanding of the instructions.   The patient was advised to call back or seek an in-person evaluation if the symptoms worsen or if the condition fails to improve as anticipated.  I provided 25 minutes of non-face-to-face time during this encounter.   Donato Schultz, DO

## 2021-01-01 ENCOUNTER — Telehealth: Payer: Self-pay | Admitting: Family Medicine

## 2021-01-01 NOTE — Telephone Encounter (Signed)
Error

## 2021-01-07 ENCOUNTER — Telehealth (INDEPENDENT_AMBULATORY_CARE_PROVIDER_SITE_OTHER): Payer: 59 | Admitting: Family Medicine

## 2021-01-07 ENCOUNTER — Other Ambulatory Visit: Payer: Self-pay

## 2021-01-07 ENCOUNTER — Encounter: Payer: Self-pay | Admitting: Family Medicine

## 2021-01-07 VITALS — Ht 68.0 in | Wt 145.0 lb

## 2021-01-07 DIAGNOSIS — M25562 Pain in left knee: Secondary | ICD-10-CM | POA: Diagnosis not present

## 2021-01-07 NOTE — Progress Notes (Signed)
Virtual Visit via Video Note  I connected with Jeanne Davis on 01/07/21 at  8:00 AM EST by a video enabled telemedicine application and verified that I am speaking with the correct person using two identifiers.  Location/ people in visit  Patient: work -- alone  Provider: home    I discussed the limitations of evaluation and management by telemedicine and the availability of in person appointments. The patient expressed understanding and agreed to proceed.  History of Present Illness: Pt is at work f/u on bronchitis--- symptoms have resolved and covid test was neg Pt is still c/o L knee pain--- mobic helps but she would like to be referred to sport med    Observations/Objective: There were no vitals filed for this visit.  Pt is in NAD    Assessment and Plan: 1. Acute pain of left knee con't mobic prn Can try voltaren gel otc Refer to sport med  - Ambulatory referral to Sports Medicine   Follow Up Instructions:    I discussed the assessment and treatment plan with the patient. The patient was provided an opportunity to ask questions and all were answered. The patient agreed with the plan and demonstrated an understanding of the instructions.   The patient was advised to call back or seek an in-person evaluation if the symptoms worsen or if the condition fails to improve as anticipated.  I provided 25 minutes of non-face-to-face time during this encounter.   [name], DO

## 2021-01-21 NOTE — Progress Notes (Unsigned)
[place] Phone: [phone] Subjective:   Jeanne Davis, am serving as a scribe for Dr. Hulan Saas. This visit occurred during the SARS-CoV-2 public health emergency.  Safety protocols were in place, including screening questions prior to the visit, additional usage of staff PPE, and extensive cleaning of exam room while observing appropriate contact time as indicated for disinfecting solutions.   I'm seeing this patient by the request  of:  [name], DO  CC: Left knee pain  XBD:[id number]  Jeanne Davis is a 45 y.o. female coming in with complaint of left knee pain for past 2 months. Pain is over lateral aspect of knee and can occasionally radiate into medial aspect. Insidious onset. Stiffness after she sits for a while. Patient states that she is not active. Patient was prescribed meloxicam and exercises that she has not been doing. Has been using a compression brace.      Patient was seen by primary care provider previously and meloxicam does help  Davis past medical history on file. Past Surgical History:  Procedure Laterality Date  . WISDOM TOOTH EXTRACTION     Social History   Socioeconomic History  . Marital status: Single    Spouse name: Not on file  . Number of children: Not on file  . Years of education: Not on file  . Highest education level: Not on file  Occupational History  . 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  Tobacco Use  . Smoking status: Never Smoker  . Smokeless tobacco: Never Used  Substance and Sexual Activity  . Alcohol use: Davis  . Drug use: Davis  . Sexual activity: Yes    Partners: Male    Birth control/protection: None  Other Topics Concern  . Not on file  Social History Narrative   Exercise --- Davis   Social Determinants of Health   Financial Resource Strain: Not on file  Food Insecurity: Not on file  Transportation Needs: Not on file  Physical Activity:  Not on file  Stress: Not on file  Social Connections: Not on file   Davis Known Allergies Family History  Problem Relation Age of Onset  . Coronary artery disease Father   . Hyperlipidemia Father   . Diabetes Father   . Hypertension Father   . Hyperlipidemia Mother   . Diabetes Sister        type I---half sister  . Diabetes Maternal Grandmother   . Heart disease Maternal Grandmother        CHF  . Breast cancer Maternal Aunt   . Cancer Maternal Aunt        breast ca  . Heart attack Maternal Aunt   . Cancer Maternal Aunt        fallopian tubes  . Cancer Maternal Uncle        tumor kidney, colon cancer  . Cancer Paternal Aunt         breast    Current Outpatient Medications (Endocrine & Metabolic):  Marland Kitchen  Norethindrone Acetate-Ethinyl Estrad-FE (LOESTRIN 24 FE) 1-20 MG-MCG(24) tablet, Take 1 tablet by mouth daily.   Current Outpatient Medications (Respiratory):  .  fluticasone (FLONASE) 50 MCG/ACT nasal spray, Place 2 sprays into both nostrils daily.  Current Outpatient Medications (Analgesics):  .  meloxicam (MOBIC) 15 MG tablet, Take 1 tablet (15 mg total) by mouth daily.   Current Outpatient Medications (Other):  .  azithromycin (ZITHROMAX Z-PAK) 250 MG tablet, As  directed .  Lactic Ac-Citric Ac-Pot Bitart (PHEXXI) 1.8-1-0.4 % GEL, Place 1 application vaginally daily. May use 2-3 times per week. .  valACYclovir (VALTREX) 1000 MG tablet, Take 1 tablet (1,000 mg total) by mouth daily.   Reviewed prior external information including notes and imaging from  primary care provider As well as notes that were available from care everywhere and other healthcare systems.  Past medical history, social, surgical and family history all reviewed in electronic medical record.  Davis pertanent information unless stated regarding to the chief complaint.   Review of Systems:  Davis headache, visual changes, nausea, vomiting, diarrhea, constipation, dizziness, abdominal pain, skin rash, fevers,  chills, night sweats, weight loss, swollen lymph nodes, body aches, joint swelling, chest pain, shortness of breath, mood changes. POSITIVE muscle aches  Objective  Blood pressure 120/76, pulse 75, height 5\' 8"  (1.727 m), weight 151 lb (68.5 kg), last menstrual period 01/08/2021, SpO2 99 %.   General: Davis apparent distress alert and oriented x3 mood and affect normal, dressed appropriately.  HEENT: Pupils equal, extraocular movements intact  Respiratory: Patient's speak in full sentences and does not appear short of breath  Cardiovascular: Davis lower extremity edema, non tender, Davis erythema  Gait mild antalgic MSK: Left knee has a significant effusion noted.  Lacks the last 15 degrees of flexion.  Lacks 2 degrees of extension.  Patient is minorly tender more over the lateral than the medial aspect of the knee.  Difficult to do stability testing secondary to the amount of swelling.  Limited musculoskeletal ultrasound was performed and interpreted by Lyndal Pulley  Patient has significant swelling at the patellofemoral joint noted.  Mild narrowing of the lateral aspect of the patellofemoral joint but Davis true loose body noted.  Mild synovitis noted.  Patient does have maybe a lateral meniscal tear noted but nondisplaced. Impression: Severe knee effusion  Procedure: Real-time Ultrasound Guided Injection of left knee Device: GE Logiq Q7 Ultrasound guided injection is preferred based studies that show increased duration, increased effect, greater accuracy, decreased procedural pain, increased response rate, and decreased cost with ultrasound guided versus blind injection.  Verbal informed consent obtained.  Time-out conducted.  Noted Davis overlying erythema, induration, or other signs of local infection.  Skin prepped in a sterile fashion.  Local anesthesia: Topical Ethyl chloride.  With sterile technique and under real time ultrasound guidance: With a 22-gauge 2 inch needle patient was injected with  4 cc of 0.5% Marcaine and aspirated 80 cc of straw light-colored fluid 1 cc of Kenalog 40 mg/dL. This was from a superior lateral approach.  Completed without difficulty  Pain immediately resolved suggesting accurate placement of the medication.  Advised to call if fevers/chills, erythema, induration, drainage, or persistent bleeding.   Impression: Technically successful ultrasound guided injection.    Impression and Recommendations:     The above documentation has been reviewed and is accurate and complete Lyndal Pulley, DO

## 2021-01-22 ENCOUNTER — Ambulatory Visit (INDEPENDENT_AMBULATORY_CARE_PROVIDER_SITE_OTHER): Payer: 59

## 2021-01-22 ENCOUNTER — Encounter: Payer: Self-pay | Admitting: Family Medicine

## 2021-01-22 ENCOUNTER — Ambulatory Visit: Payer: Self-pay

## 2021-01-22 ENCOUNTER — Other Ambulatory Visit: Payer: Self-pay

## 2021-01-22 ENCOUNTER — Ambulatory Visit: Payer: 59 | Admitting: Family Medicine

## 2021-01-22 VITALS — BP 120/76 | HR 75 | Ht 68.0 in | Wt 151.0 lb

## 2021-01-22 DIAGNOSIS — G8929 Other chronic pain: Secondary | ICD-10-CM | POA: Diagnosis not present

## 2021-01-22 DIAGNOSIS — M25462 Effusion, left knee: Secondary | ICD-10-CM | POA: Diagnosis not present

## 2021-01-22 DIAGNOSIS — M25562 Pain in left knee: Secondary | ICD-10-CM

## 2021-01-22 IMAGING — DX DG KNEE 3 VIEWS*L*
3 series · 3 of 3 positions shown · non-contrast
Comparison: None.

CLINICAL DATA: Left knee pain, status post fluid aspiration

EXAM:
LEFT KNEE - 3 VIEW

[knee ap]
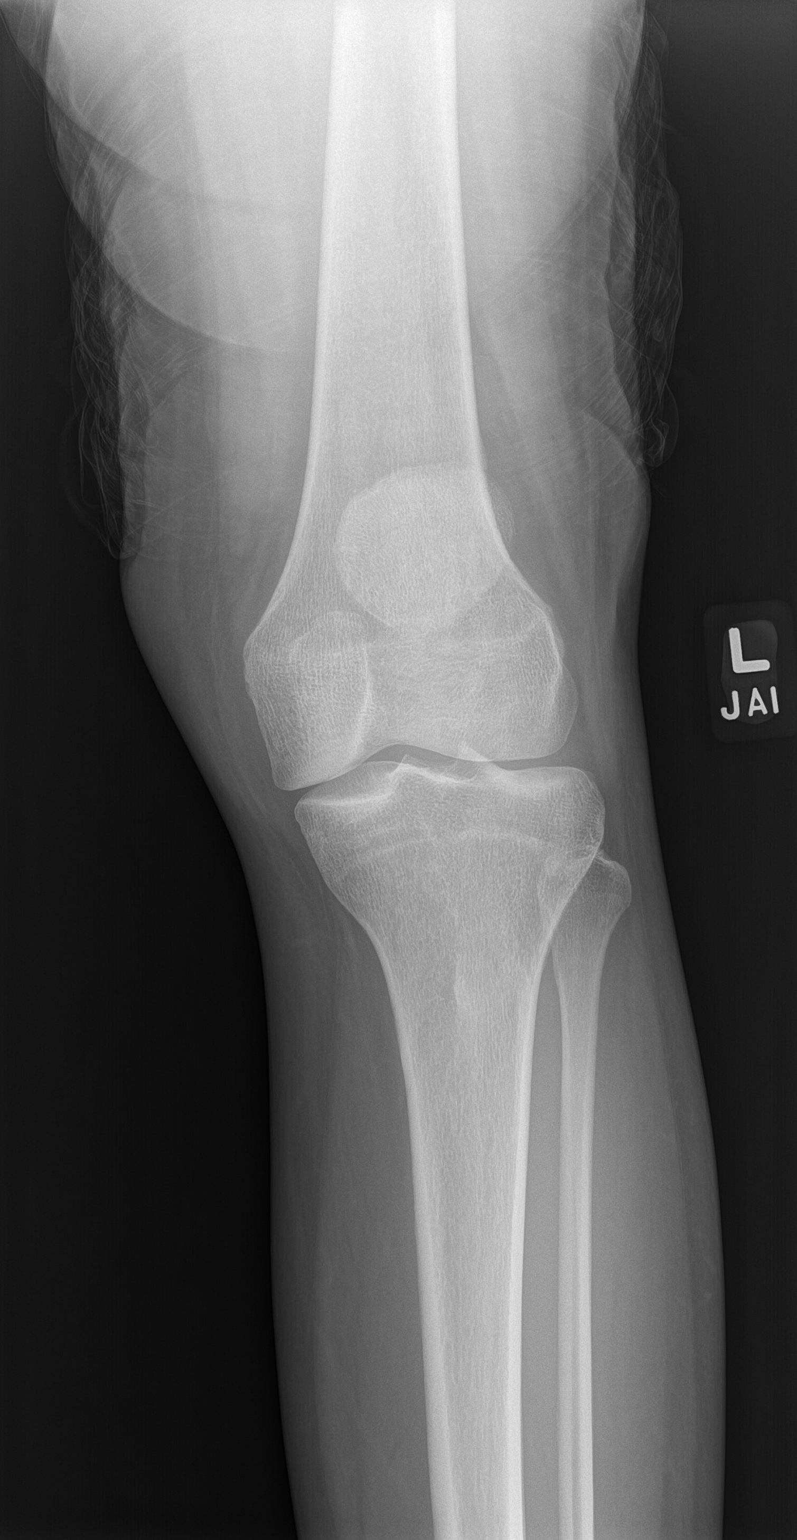

[knee lat]
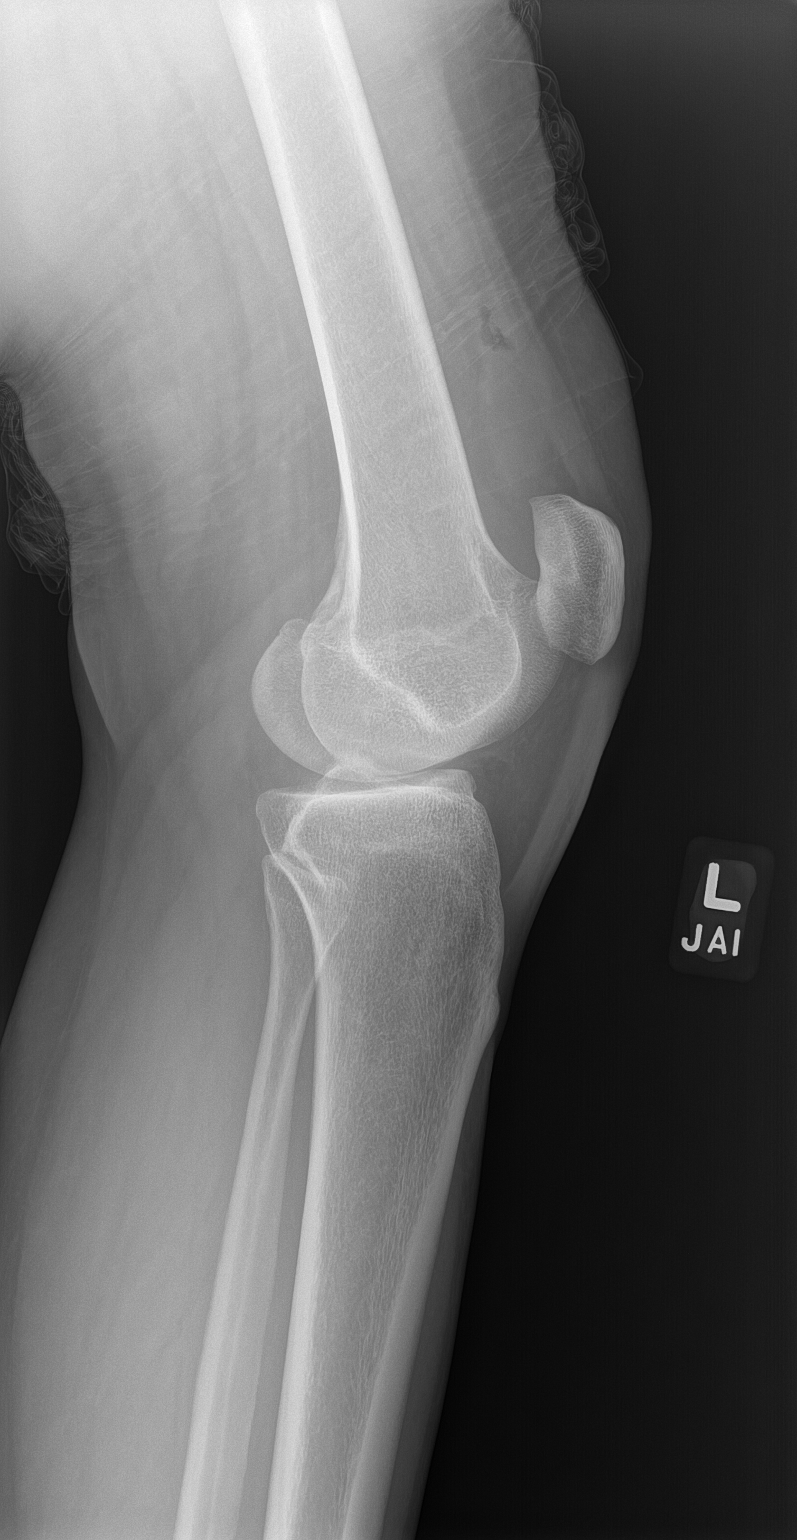

[patella]
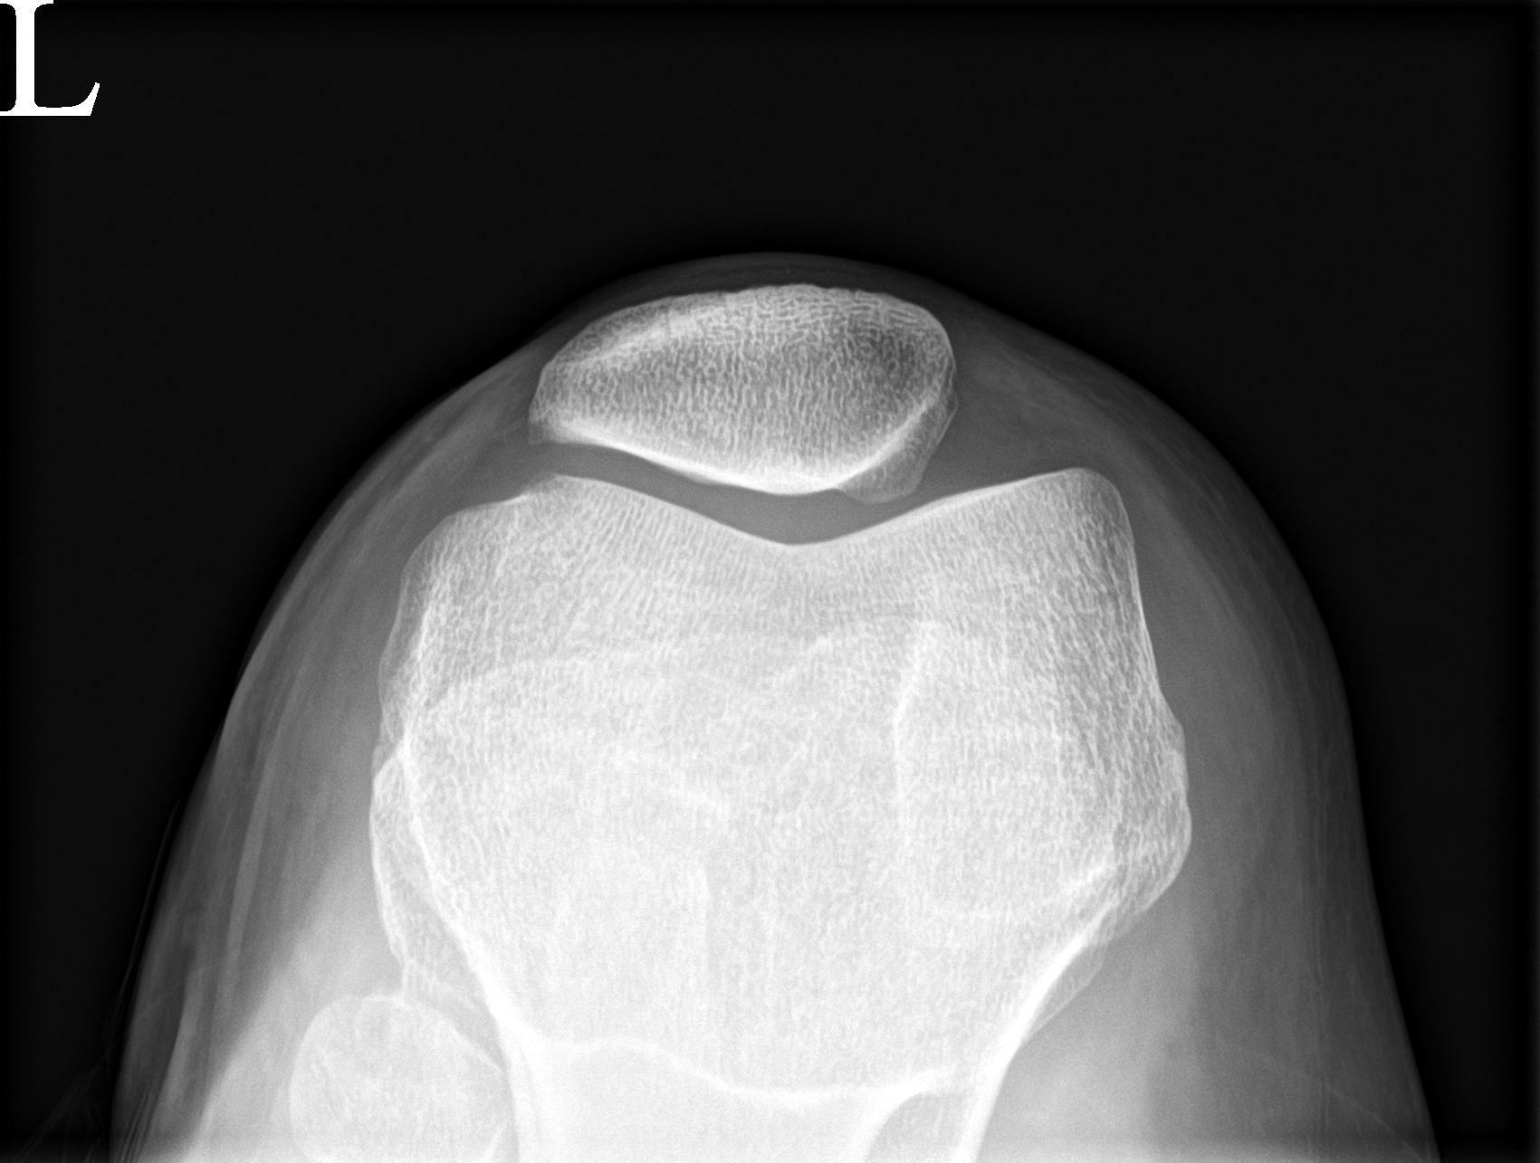

[3 of 3 positions shown; findings below may reference images not displayed]

FINDINGS: Small amount of suprapatellar gas, likely related to earlier
procedure. No focal osseous abnormality. Small suprapatellar
effusion.
IMPRESSION: Small suprapatellar effusion.  Small amount of postprocedural gas.

## 2021-01-22 NOTE — Assessment & Plan Note (Signed)
Patient has significant large effusion of the knee drained today.  Had to wait on x-ray submitted at the end of the office visit and imaging is pending.  Patient does not remember any true injury.  Saw another individual and was diagnosed with potential plica syndrome.  I believe that patient had more of a subluxation of the patella and given a Tru pull lite brace.  Will send patient's fluid to the lab for further evaluation as well.  If patient has any increasing instability, reaccumulation of the fluid I do feel that advanced imaging would be warranted.  Patient will follow up with me again in 3 weeks.

## 2021-01-22 NOTE — Patient Instructions (Addendum)
Xray today Exercises 3x a week Knee xray today Drained knee today Brace daily for a week and then with exercises Pennsaid 2 times a day Ice 20 mins 2 times a day See me again in 3 weeks no better MRI

## 2021-01-23 ENCOUNTER — Encounter: Payer: Self-pay | Admitting: Family Medicine

## 2021-01-23 ENCOUNTER — Other Ambulatory Visit: Payer: Self-pay | Admitting: Family Medicine

## 2021-01-23 LAB — SYNOVIAL FLUID ANALYSIS, COMPLETE
Basophils, %: 0 %
Eosinophils-Synovial: 0 % (ref 0–2)
Lymphocytes-Synovial Fld: 25 % (ref 0–74)
Monocyte/Macrophage: 66 % (ref 0–69)
Neutrophil, Synovial: 9 % (ref 0–24)
Synoviocytes, %: 0 % (ref 0–15)
WBC, Synovial: 685 cells/uL — ABNORMAL HIGH (ref <150)

## 2021-01-23 MED ORDER — DOXYCYCLINE HYCLATE 100 MG PO TABS: 100.0000 mg | ORAL_TABLET | Freq: Two times a day (BID) | ORAL | 0 refills | Status: AC

## 2021-01-23 NOTE — Progress Notes (Signed)
Mild elevated WBC with the synovial fluid, send in doxy to cover with it being the weekend and weather coming  Attempted call but no answer Sent message through my chart

## 2021-02-11 NOTE — Progress Notes (Signed)
[place] Phone: [phone] Subjective:   [name], am serving as a scribe for Dr. Hulan Saas. This visit occurred during the SARS-CoV-2 public health emergency.  Safety protocols were in place, including screening questions prior to the visit, additional usage of staff PPE, and extensive cleaning of exam room while observing appropriate contact time as indicated for disinfecting solutions.   I'm seeing this patient by the request  of:  [name], DO  CC:  Knee pain follow up   [id number]   01/22/2021 Patient has significant large effusion of the knee drained today.  Had to wait on x-ray submitted at the end of the office visit and imaging is pending.  Patient does not remember any true injury.  Saw another individual and was diagnosed with potential plica syndrome.  I believe that patient had more of a subluxation of the patella and given a Tru pull lite brace.  Will send patient's fluid to the lab for further evaluation as well.  If patient has any increasing instability, reaccumulation of the fluid I do feel that advanced imaging would be warranted.  Patient will follow up with me again in 3 weeks.  Update 02/12/2021 GINELLE BAYS is a 45 y.o. female coming in with complaint of left knee pain. Patient states that she is doing a lot better. No soreness or swelling. Does catch intermittently.   Left knee xray 01/22/2021 IMPRESSION: Small suprapatellar effusion.  Small amount of postprocedural gas.   Patient did have aspiration of the knee done at last exam secondary to the large effusion.  No past medical history on file. Past Surgical History:  Procedure Laterality Date  . WISDOM TOOTH EXTRACTION     Social History   Socioeconomic History  . Marital status: Single    Spouse name: Not on file  . Number of children: Not on file  . Years of education: Not on file  . Highest education  level: Not on file  Occupational History  . 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[employer]  Tobacco Use  . Smoking status: Never Smoker  . Smokeless tobacco: Never Used  Substance and Sexual Activity  . Alcohol use: No  . Drug use: No  . Sexual activity: Yes    Partners: Male    Birth control/protection: None  Other Topics Concern  . Not on file  Social History Narrative   Exercise --- no   Social Determinants of Health   Financial Resource Strain: Not on file  Food Insecurity: Not on file  Transportation Needs: Not on file  Physical Activity: Not on file  Stress: Not on file  Social Connections: Not on file   No Known Allergies Family History  Problem Relation Age of Onset  . Coronary artery disease Father   . Hyperlipidemia Father   . Diabetes Father   . Hypertension Father   . Hyperlipidemia Mother   . Diabetes Sister        type I---half sister  . Diabetes Maternal Grandmother   . Heart disease Maternal Grandmother        CHF  . Breast cancer Maternal Aunt   . Cancer Maternal Aunt        breast ca  . Heart attack Maternal Aunt   . Cancer Maternal Aunt        fallopian tubes  . Cancer Maternal Uncle        tumor kidney, colon cancer  .  Cancer Paternal Aunt         breast    Current Outpatient Medications (Endocrine & Metabolic):  Marland Kitchen  Norethindrone Acetate-Ethinyl Estrad-FE (LOESTRIN 24 FE) 1-20 MG-MCG(24) tablet, Take 1 tablet by mouth daily.   Current Outpatient Medications (Respiratory):  .  fluticasone (FLONASE) 50 MCG/ACT nasal spray, Place 2 sprays into both nostrils daily.  Current Outpatient Medications (Analgesics):  .  meloxicam (MOBIC) 15 MG tablet, Take 1 tablet (15 mg total) by mouth daily.   Current Outpatient Medications (Other):  .  azithromycin (ZITHROMAX Z-PAK) 250 MG tablet, As directed .  Lactic Ac-Citric Ac-Pot Bitart (PHEXXI) 1.8-1-0.4 % GEL, Place 1 application vaginally daily. May use 2-3 times per week. .  valACYclovir (VALTREX) 1000 MG  tablet, Take 1 tablet (1,000 mg total) by mouth daily.   Reviewed prior external information including notes and imaging from  primary care provider As well as notes that were available from care everywhere and other healthcare systems.  Past medical history, social, surgical and family history all reviewed in electronic medical record.  No pertanent information unless stated regarding to the chief complaint.   Review of Systems:  No headache, visual changes, nausea, vomiting, diarrhea, constipation, dizziness, abdominal pain, skin rash, fevers, chills, night sweats, weight loss, swollen lymph nodes, body aches, joint swelling, chest pain, shortness of breath, mood changes. POSITIVE muscle aches  Objective  Blood pressure 110/72, pulse 68, height 5\' 8"  (1.727 m), weight 147 lb (66.7 kg), SpO2 98 %.   General: No apparent distress alert and oriented x3 mood and affect normal, dressed appropriately.  HEENT: Pupils equal, extraocular movements intact  Respiratory: Patient's speak in full sentences and does not appear short of breath  Cardiovascular: No lower extremity edema, non tender, no erythema  Gait normal with good balance and coordination.  MSK: Patient does have hypermobility noted.  Does have some long extremities.  Patient's left knee does have good range of motion.  Nontender on exam.  No effusion noted.  Negative McMurray's.  Limited musculoskeletal ultrasound was performed and interpreted by Lyndal Pulley  Limited ultrasound of patient's left knee still show the patient's lateral meniscus likely does have a tear with a para meniscal cyst.  No abnormal blood flow in the area.  No cortical irregularities noted.  No swelling noted of the patellofemoral joint.  Unable to save pictures secondary to IT issues Impression: Questionable lateral meniscal tear with parameniscal cyst.    Impression and Recommendations:     The above documentation has been reviewed and is accurate and  complete Lyndal Pulley, DO

## 2021-02-12 ENCOUNTER — Encounter: Payer: Self-pay | Admitting: Family Medicine

## 2021-02-12 ENCOUNTER — Other Ambulatory Visit: Payer: Self-pay

## 2021-02-12 ENCOUNTER — Ambulatory Visit (INDEPENDENT_AMBULATORY_CARE_PROVIDER_SITE_OTHER): Payer: 59 | Admitting: Family Medicine

## 2021-02-12 DIAGNOSIS — M25462 Effusion, left knee: Secondary | ICD-10-CM

## 2021-02-12 NOTE — Patient Instructions (Signed)
Maybe a lateral meniscal tear with cyst If no pain let's not do anything Be as active as you want If pain instability or swelling occurs, call me Have appt in 3 months just in case

## 2021-02-12 NOTE — Assessment & Plan Note (Signed)
Effusion is completely resolved at this time.  Patient did take antibiotics to be safe.  Patient is doing much better.  Patient though does have on ultrasound potential for lateral meniscal tear and may have a reaccumulation again.  Patient is a point though is not having any locking and giving out on her.  We discussed though if this did occur that she needs to come back.  Patient will otherwise will follow up with me again in 3 months.

## 2021-02-26 ENCOUNTER — Other Ambulatory Visit: Payer: Self-pay

## 2021-02-27 ENCOUNTER — Encounter: Payer: Self-pay | Admitting: Family Medicine

## 2021-02-27 ENCOUNTER — Ambulatory Visit (INDEPENDENT_AMBULATORY_CARE_PROVIDER_SITE_OTHER): Payer: 59 | Admitting: Family Medicine

## 2021-02-27 VITALS — BP 108/72 | HR 89 | Temp 98.9°F | Resp 18 | Ht 68.0 in | Wt 143.2 lb

## 2021-02-27 DIAGNOSIS — Z23 Encounter for immunization: Secondary | ICD-10-CM

## 2021-02-27 DIAGNOSIS — Z862 Personal history of diseases of the blood and blood-forming organs and certain disorders involving the immune mechanism: Secondary | ICD-10-CM

## 2021-02-27 DIAGNOSIS — Z0000 Encounter for general adult medical examination without abnormal findings: Secondary | ICD-10-CM

## 2021-02-27 DIAGNOSIS — Z1159 Encounter for screening for other viral diseases: Secondary | ICD-10-CM

## 2021-02-27 DIAGNOSIS — N946 Dysmenorrhea, unspecified: Secondary | ICD-10-CM

## 2021-02-27 LAB — LIPID PANEL
Cholesterol: 218 mg/dL — ABNORMAL HIGH (ref 0–200)
HDL: 80.6 mg/dL (ref >39.00)
LDL Cholesterol: 110 mg/dL — ABNORMAL HIGH (ref 0–99)
NonHDL: 137.33
Total CHOL/HDL Ratio: 3
Triglycerides: 135 mg/dL (ref 0.0–149.0)
VLDL: 27 mg/dL (ref 0.0–40.0)

## 2021-02-27 LAB — COMPREHENSIVE METABOLIC PANEL
ALT: 7 U/L (ref 0–35)
AST: 10 U/L (ref 0–37)
Albumin: 4.2 g/dL (ref 3.5–5.2)
Alkaline Phosphatase: 50 U/L (ref 39–117)
BUN: 12 mg/dL (ref 6–23)
CO2: 24 mEq/L (ref 19–32)
Calcium: 9.2 mg/dL (ref 8.4–10.5)
Chloride: 105 mEq/L (ref 96–112)
Creatinine, Ser: 0.83 mg/dL (ref 0.40–1.20)
GFR: 85.8 mL/min (ref >60.00)
Glucose, Bld: 84 mg/dL (ref 70–99)
Potassium: 4.3 mEq/L (ref 3.5–5.1)
Sodium: 139 mEq/L (ref 135–145)
Total Bilirubin: 0.6 mg/dL (ref 0.2–1.2)
Total Protein: 7.1 g/dL (ref 6.0–8.3)

## 2021-02-27 LAB — CBC WITH DIFFERENTIAL/PLATELET
Basophils Absolute: 0.1 10*3/uL (ref 0.0–0.1)
Basophils Relative: 1.1 % (ref 0.0–3.0)
Eosinophils Absolute: 0 10*3/uL (ref 0.0–0.7)
Eosinophils Relative: 0.5 % (ref 0.0–5.0)
HCT: 37.7 % (ref 36.0–46.0)
Hemoglobin: 12.6 g/dL (ref 12.0–15.0)
Lymphocytes Relative: 26.2 % (ref 12.0–46.0)
Lymphs Abs: 1.2 10*3/uL (ref 0.7–4.0)
MCHC: 33.3 g/dL (ref 30.0–36.0)
MCV: 97.5 fl (ref 78.0–100.0)
Monocytes Absolute: 0.3 10*3/uL (ref 0.1–1.0)
Monocytes Relative: 6 % (ref 3.0–12.0)
Neutro Abs: 3 10*3/uL (ref 1.4–7.7)
Neutrophils Relative %: 66.2 % (ref 43.0–77.0)
Platelets: 326 10*3/uL (ref 150.0–400.0)
RBC: 3.87 Mil/uL (ref 3.87–5.11)
RDW: 13.5 % (ref 11.5–15.5)
WBC: 4.5 10*3/uL (ref 4.0–10.5)

## 2021-02-27 LAB — HEPATITIS C ANTIBODY
Hepatitis C Ab: NONREACTIVE
SIGNAL TO CUT-OFF: 0.01 (ref <1.00)

## 2021-02-27 LAB — IBC + FERRITIN
Ferritin: 12.2 ng/mL (ref 10.0–291.0)
Iron: 135 ug/dL (ref 42–145)
Saturation Ratios: 32.6 % (ref 20.0–50.0)
Transferrin: 296 mg/dL (ref 212.0–360.0)

## 2021-02-27 LAB — VITAMIN B12: Vitamin B-12: 133 pg/mL — ABNORMAL LOW (ref 211–911)

## 2021-02-27 LAB — TSH: TSH: 0.69 u[IU]/mL (ref 0.35–4.50)

## 2021-02-27 NOTE — Patient Instructions (Signed)
Preventive Care 84-45 Years Old, Female Preventive care refers to lifestyle choices and visits with your health care provider that can promote health and wellness. This includes:  A yearly physical exam. This is also called an annual wellness visit.  Regular dental and eye exams.  Immunizations.  Screening for certain conditions.  Healthy lifestyle choices, such as: ? Eating a healthy diet. ? Getting regular exercise. ? Not using drugs or products that contain nicotine and tobacco. ? Limiting alcohol use. What can I expect for my preventive care visit? Physical exam Your health care provider will check your:  Height and weight. These may be used to calculate your BMI (body mass index). BMI is a measurement that tells if you are at a healthy weight.  Heart rate and blood pressure.  Body temperature.  Skin for abnormal spots. Counseling Your health care provider may ask you questions about your:  Past medical problems.  Family's medical history.  Alcohol, tobacco, and drug use.  Emotional well-being.  Home life and relationship well-being.  Sexual activity.  Diet, exercise, and sleep habits.  Work and work Statistician.  Access to firearms.  Method of birth control.  Menstrual cycle.  Pregnancy history. What immunizations do I need? Vaccines are usually given at various ages, according to a schedule. Your health care provider will recommend vaccines for you based on your age, medical history, and lifestyle or other factors, such as travel or where you work.   What tests do I need? Blood tests  Lipid and cholesterol levels. These may be checked every 5 years, or more often if you are over 3 years old.  Hepatitis C test.  Hepatitis B test. Screening  Lung cancer screening. You may have this screening every year starting at age 73 if you have a 30-pack-year history of smoking and currently smoke or have quit within the past 15 years.  Colorectal cancer  screening. ? All adults should have this screening starting at age 52 and continuing until age 17. ? Your health care provider may recommend screening at age 49 if you are at increased risk. ? You will have tests every 1-10 years, depending on your results and the type of screening test.  Diabetes screening. ? This is done by checking your blood sugar (glucose) after you have not eaten for a while (fasting). ? You may have this done every 1-3 years.  Mammogram. ? This may be done every 1-2 years. ? Talk with your health care provider about when you should start having regular mammograms. This may depend on whether you have a family history of breast cancer.  BRCA-related cancer screening. This may be done if you have a family history of breast, ovarian, tubal, or peritoneal cancers.  Pelvic exam and Pap test. ? This may be done every 3 years starting at age 10. ? Starting at age 11, this may be done every 5 years if you have a Pap test in combination with an HPV test. Other tests  STD (sexually transmitted disease) testing, if you are at risk.  Bone density scan. This is done to screen for osteoporosis. You may have this scan if you are at high risk for osteoporosis. Talk with your health care provider about your test results, treatment options, and if necessary, the need for more tests. Follow these instructions at home: Eating and drinking  Eat a diet that includes fresh fruits and vegetables, whole grains, lean protein, and low-fat dairy products.  Take vitamin and mineral supplements  as recommended by your health care provider.  Do not drink alcohol if: ? Your health care provider tells you not to drink. ? You are pregnant, may be pregnant, or are planning to become pregnant.  If you drink alcohol: ? Limit how much you have to 0-1 drink a day. ? Be aware of how much alcohol is in your drink. In the [place] one drink equals one 12 oz bottle of beer (355 mL), one 5 oz glass of  wine (148 mL), or one 1 oz glass of hard liquor (44 mL).   Lifestyle  Take daily care of your teeth and gums. Brush your teeth every morning and night with fluoride toothpaste. Floss one time each day.  Stay active. Exercise for at least 30 minutes 5 or more days each week.  Do not use any products that contain nicotine or tobacco, such as cigarettes, e-cigarettes, and chewing tobacco. If you need help quitting, ask your health care provider.  Do not use drugs.  If you are sexually active, practice safe sex. Use a condom or other form of protection to prevent STIs (sexually transmitted infections).  If you do not wish to become pregnant, use a form of birth control. If you plan to become pregnant, see your health care provider for a prepregnancy visit.  If told by your health care provider, take low-dose aspirin daily starting at age 50.  Find healthy ways to cope with stress, such as: ? Meditation, yoga, or listening to music. ? Journaling. ? Talking to a trusted person. ? Spending time with friends and family. Safety  Always wear your seat belt while driving or riding in a vehicle.  Do not drive: ? If you have been drinking alcohol. Do not ride with someone who has been drinking. ? When you are tired or distracted. ? While texting.  Wear a helmet and other protective equipment during sports activities.  If you have firearms in your house, make sure you follow all gun safety procedures. What's next?  Visit your health care provider once a year for an annual wellness visit.  Ask your health care provider how often you should have your eyes and teeth checked.  Stay up to date on all vaccines. This information is not intended to replace advice given to you by your health care provider. Make sure you discuss any questions you have with your health care provider. Document Revised: 09/16/2020 Document Reviewed: 08/24/2018 Elsevier Patient Education  2021 Elsevier Inc.  

## 2021-02-27 NOTE — Progress Notes (Signed)
Subjective:     Jeanne Davis is a 45 y.o. female and is here for a comprehensive physical exam. The patient reports problems - more cramping and clots with period--- she will discuss with gyn.  Her bcp changed at the pharmacy   Social History   Socioeconomic History  . Marital status: Single    Spouse name: Not on file  . Number of children: Not on file  . Years of education: Not on file  . Highest education level: Not on file  Occupational History  . 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[employer]  Tobacco Use  . Smoking status: Never Smoker  . Smokeless tobacco: Never Used  Substance and Sexual Activity  . Alcohol use: No  . Drug use: No  . Sexual activity: Yes    Partners: Male    Birth control/protection: None  Other Topics Concern  . Not on file  Social History Narrative   Exercise --- no   Social Determinants of Health   Financial Resource Strain: Not on file  Food Insecurity: Not on file  Transportation Needs: Not on file  Physical Activity: Not on file  Stress: Not on file  Social Connections: Not on file  Intimate Partner Violence: Not on file   Health Maintenance  Topic Date Due  . Hepatitis C Screening  Never done  . TETANUS/TDAP  07/06/2020  . MAMMOGRAM  03/03/2021  . PAP SMEAR-Modifier  02/26/2023  . INFLUENZA VACCINE  Completed  . COVID-19 Vaccine  Completed  . HIV Screening  Completed  . HPV VACCINES  Aged Out    The following portions of the patient's history were reviewed and updated as appropriate:  She  has no past medical history on file. She does not have any pertinent problems on file. She  has a past surgical history that includes Wisdom tooth extraction. Her family history includes Breast cancer in her maternal aunt; Cancer in her maternal aunt, maternal aunt, maternal uncle, and paternal aunt; Coronary artery disease in her father; Diabetes in her father, maternal grandmother, and sister; Heart attack in her maternal aunt; Heart disease in her  maternal grandmother; Hyperlipidemia in her father and mother; Hypertension in her father. She  reports that she has never smoked. She has never used smokeless tobacco. She reports that she does not drink alcohol and does not use drugs. She has a current medication list which includes the following prescription(s): fluticasone, phexxi, meloxicam, norethindrone acetate-ethinyl estrad-fe, valacyclovir, and azithromycin. Current Outpatient Medications on File Prior to Visit  Medication Sig Dispense Refill  . fluticasone (FLONASE) 50 MCG/ACT nasal spray Place 2 sprays into both nostrils daily. 16 g 6  . Lactic Ac-Citric Ac-Pot Bitart (PHEXXI) 1.8-1-0.4 % GEL Place 1 application vaginally daily. May use 2-3 times per week. 60 g 3  . meloxicam (MOBIC) 15 MG tablet Take 1 tablet (15 mg total) by mouth daily. 30 tablet 0  . Norethindrone Acetate-Ethinyl Estrad-FE (LOESTRIN 24 FE) 1-20 MG-MCG(24) tablet Take 1 tablet by mouth daily. 1 Package 11  . valACYclovir (VALTREX) 1000 MG tablet Take 1 tablet (1,000 mg total) by mouth daily. 90 tablet 3  . azithromycin (ZITHROMAX Z-PAK) 250 MG tablet As directed (Patient not taking: Reported on 02/27/2021) 6 each 0   No current facility-administered medications on file prior to visit.   She has No Known Allergies..  Review of Systems Review of Systems  Constitutional: Negative for activity change, appetite change and fatigue.  HENT: Negative for hearing loss, congestion, tinnitus and ear  discharge.  dentist q71m Eyes: Negative for visual disturbance (see optho q1y -- vision corrected to 20/20 with glasses).  Respiratory: Negative for cough, chest tightness and shortness of breath.   Cardiovascular: Negative for chest pain, palpitations and leg swelling.  Gastrointestinal: Negative for abdominal pain, diarrhea, constipation and abdominal distention.  Genitourinary: Negative for urgency, frequency, decreased urine volume and difficulty urinating. + worsening  menstrual cramps --- Musculoskeletal: Negative for back pain, arthralgias and gait problem.  Skin: Negative for color change, pallor and rash.  Neurological: Negative for dizziness, light-headedness, numbness and headaches.  Hematological: Negative for adenopathy. Does not bruise/bleed easily.  Psychiatric/Behavioral: Negative for suicidal ideas, confusion, sleep disturbance, self-injury, dysphoric mood, decreased concentration and agitation.       Objective:    BP 108/72 (BP Location: Left Arm, Patient Position: Sitting, Cuff Size: Normal)   Pulse 89   Temp 98.9 F (37.2 C) (Oral)   Resp 18   Ht 5\' 8"  (1.727 m)   Wt 143 lb 3.2 oz (65 kg)   SpO2 99%   BMI 21.77 kg/m  General appearance: alert, cooperative, appears stated age and no distress Head: Normocephalic, without obvious abnormality, atraumatic Eyes: negative findings: lids and lashes normal, conjunctivae and sclerae normal and pupils equal, round, reactive to light and accomodation Ears: normal TM's and external ear canals both ears Neck: no adenopathy, no carotid bruit, no JVD, supple, symmetrical, trachea midline and thyroid not enlarged, symmetric, no tenderness/mass/nodules Back: symmetric, no curvature. ROM normal. No CVA tenderness. Lungs: clear to auscultation bilaterally Breasts: normal appearance, no masses or tenderness Heart: regular rate and rhythm, S1, S2 normal, no murmur, click, rub or gallop Abdomen: soft, non-tender; bowel sounds normal; no masses,  no organomegaly Pelvic: deferred Extremities: extremities normal, atraumatic, no cyanosis or edema Pulses: 2+ and symmetric Skin: Skin color, texture, turgor normal. No rashes or lesions Lymph nodes: Cervical, supraclavicular, and axillary nodes normal. Neurologic: Alert and oriented X 3, normal strength and tone. Normal symmetric reflexes. Normal coordination and gait    Assessment:    Healthy female exam.      Plan:     ghm utd Check labs See After  Visit Summary for Counseling Recommendations    1. Preventative health care See above - CBC with Differential/Platelet - Lipid panel - Comprehensive metabolic panel - TSH  2. Need for hepatitis C screening test   - Hepatitis C antibody  3. Need for tetanus booster   - Tdap vaccine greater than or equal to 7yo IM  4. History of anemia Check labs  - IBC + Ferritin - Vitamin B12  5. Dysmenorrhea F/u gyn

## 2021-03-02 ENCOUNTER — Ambulatory Visit (INDEPENDENT_AMBULATORY_CARE_PROVIDER_SITE_OTHER): Payer: 59 | Admitting: Obstetrics & Gynecology

## 2021-03-02 ENCOUNTER — Encounter: Payer: Self-pay | Admitting: Obstetrics & Gynecology

## 2021-03-02 ENCOUNTER — Other Ambulatory Visit: Payer: Self-pay

## 2021-03-02 VITALS — BP 127/67 | HR 90 | Ht 68.0 in | Wt 141.0 lb

## 2021-03-02 DIAGNOSIS — Z1231 Encounter for screening mammogram for malignant neoplasm of breast: Secondary | ICD-10-CM

## 2021-03-02 DIAGNOSIS — N921 Excessive and frequent menstruation with irregular cycle: Secondary | ICD-10-CM

## 2021-03-02 MED ORDER — NORETHINDRONE ACET-ETHINYL EST 1.5-30 MG-MCG PO TABS
1.0000 | ORAL_TABLET | Freq: Every day | ORAL | 11 refills | Status: DC
Start: 2021-03-02 — End: 2022-01-20

## 2021-03-02 NOTE — Progress Notes (Signed)
History:  45 y.o. G0P0000 here today for BTB on OCPs. She started the pills 3 months prev and had lighter cycles and less pain. The brand was changed and she has now noted increased bleeding and pain. She had not mood changes or other sx.    The following portions of the patient's history were reviewed and updated as appropriate: allergies, current medications, past family history, past medical history, past social history, past surgical history and problem list.  Review of Systems:  Pertinent items are noted in HPI.    Objective:  Physical Exam Blood pressure 127/67, pulse 90, height 5\' 8"  (1.727 m), weight 141 lb (64 kg), last menstrual period 02/06/2021.  CONSTITUTIONAL: Well-developed, well-nourished female in no acute distress.  HENT:  Normocephalic, atraumatic EYES: Conjunctivae and EOM are normal. No scleral icterus.  NECK: Normal range of motion SKIN: Skin is warm and dry. No rash noted. Not diaphoretic.No pallor. [place]: Alert and oriented to person, place, and time. Normal coordination.  Pelvic: deferred   Assessment & Plan:  BTB on OCPs after immediate relief of sx. D/w pt possible etiologies. Suspect that the EES needs to be increased in the OCPs. Will change to another type and if the sx persist will get an [place] and eval other etiologies.   Diagnoses and all orders for this visit:  Screening mammogram for breast cancer -     MS Digital Screening; Future  Breakthrough bleeding on birth control pills -     Norethindrone Acetate-Ethinyl Estradiol (LOESTRIN 1.5/30, 21,) 1.5-30 MG-MCG tablet; Take 1 tablet by mouth daily.  Breast cancer screening by mammogram  Total face-to-face time with patient was 20 min.  Greater than 50% was spent in counseling and coordination of care with the patient.   Briah Nary L. Harraway-Smith, M.D., Cherlynn June

## 2021-03-02 NOTE — Patient Instructions (Signed)
Oral Contraception Use Oral contraceptive pills (OCPs) are medicines that prevent pregnancy. OCPs work by:  Preventing the ovaries from releasing eggs.  Thickening mucus in the lower part of the uterus (cervix). This prevents sperm from entering the uterus.  Thinning the lining of the uterus (endometrium). This prevents a fertilized egg from attaching to the endometrium. Discuss possible side effects of OCPs with your health care provider. It can take 2-3 months for your body to adjust to changes in hormone levels. What are the risks? OCPs can sometimes cause side effects, such as:  Headache.  Depression.  Trouble sleeping.  Nausea and vomiting.  Breast tenderness.  Irregular bleeding or spotting during the first several months.  Bloating or fluid retention.  Increase in blood pressure. OCPs with estrogen and progestins may slightly increase the risk of:  Blood clots.  Heart attack.  Stroke. How to take OCPs Follow instructions from your health care provider about how to take your first cycle of OCPs. There are 2 types of OCPs. The first, combination OCPs, have both estrogen and progestins. The second, progestin-only pills, have only progestin.  For combination OCPs, you may start the pill: ? On day 1 of your menstrual period. ? On the first Sunday after your period starts, or on the day you get your prescription. ? At any time of your cycle. ? If you start taking the pill within 5 days after the start of your period, you will not need a backup form of birth control, such as condoms. ? If you start at any other time of your menstrual cycle, you will need to use a backup form of birth control.  For progestin-only OCPs: ? Ideally, you can start taking the pill on the first day of your menstrual period, but you can start it on any other day too. ? These pills will protect you from pregnancy after taking it for 2 days (48 hours). You can stop using a backup form of birth  control after that time. It is important that you take this pill at the same time every day. Even taking it 3 hours late can increase the risk of pregnancy. No matter which day you start the OCP, you will always start a new pack on that same day of the week. Have an extra pack of OCPs and a backup contraceptive method available in case you miss some pills or lose your OCP pack. Missed doses Follow instructions from your health care provider for missed doses. Information about missed doses can also be found in the patient information sheet that comes with your pack of pills.  In general, for combined OCPs: ? If you forget to take the pill for 1 day, take it as soon as you can. This may mean taking 2 pills on the same day and at the same time. Take the next day's pill at the regular time. ? If you forget to take the pill for 2 days in a row, take 2 tablets on the day you remember and 2 tablets on the following day. A backup form of birth control should be used for 7 days after you are back on schedule. ? If you forget to take the pill for 3 days in a row, call your health care provider for directions on when to restart taking your pills. Do not take the missed pills. A backup form of birth control will be needed for 7 days once you restart your pills. ? If you use a pack that  contains inactive pills and you miss 1 or more of the inactive pills, you do not need to take the missed doses. Skip them and start the new pack on the regular day.  For progestin-only OCPs: ? If your dose is 3 hours or more late, or if you miss 1 or more doses, take 1 missed pill as soon as you can. ? If you miss one or more doses, you must use a backup form of birth control. Some brands of progestin-only pills recommend using a backup form of birth control for 48 hours after a missed or late dose while others recommend 7 days. If you are not sure what to do, call your health care provider or check the patient information sheet that  came with your pills.   Follow these instructions at home:  Do not use any products that contain nicotine or tobacco. These include cigarettes, chewing tobacco, or vaping devices, such as e-cigarettes. If you need help quitting, ask your health care provider.  Always use a condom to protect against STIs (sexually transmitted infections). Oral contraception pills do not protect against STIs.  Use a calendar to mark the days of your menstrual period.  Read the information sheet and directions that came with your OCP. Talk to your health care provider if you have questions. Contact a health care provider if:  You develop nausea and vomiting.  You have abnormal vaginal discharge or bleeding.  You develop a rash.  You miss your menstrual period. Depending on the type of OCP you are taking, this may be a sign of pregnancy.  You are losing your hair.  You need treatment for mood swings or depression.  You get dizzy when taking the OCP.  You develop acne after taking the OCP.  You become pregnant or think you may be pregnant.  You have diarrhea, constipation, and abdominal pain or cramps.  You are not sure what to do after missing pills. Get help right away if:  You develop chest pain.  You develop shortness of breath.  You have an uncontrolled or severe headache.  You develop numbness or slurred speech.  You develop vision or speech problems.  You develop pain, redness, and swelling in your legs.  You develop weakness or numbness in your arms or legs. These symptoms may represent a serious problem that is an emergency. Do not wait to see if the symptoms will go away. Get medical help right away. Call your local emergency services (911 in the [place] Do not drive yourself to the hospital. Summary  Oral contraceptive pills (OCPs) are medicines that you take to prevent pregnancy.  OCPs do not prevent sexually transmitted infections (STIs). Always use a condom to protect  against STIs.  When you start an OCP, be aware that it can take 2-3 months for your body to adjust to changes in hormone levels.  Read all the information and directions that come with your OCP. This information is not intended to replace advice given to you by your health care provider. Make sure you discuss any questions you have with your health care provider. Document Revised: 08/21/2020 Document Reviewed: 08/21/2020 Elsevier Patient Education  [place].

## 2021-03-02 NOTE — Progress Notes (Signed)
Patient is on birth control and had some breakthrough bleeding on Feb. 19th. The pharmacy gave her a different generic pill ( still a 1mg -20 mcg pill). Patient states she is still having painful periods during period with clots.  Last pap March 02-2020 (wnl) performed by Dr. Cassandria Santee RN

## 2021-03-31 ENCOUNTER — Encounter (HOSPITAL_BASED_OUTPATIENT_CLINIC_OR_DEPARTMENT_OTHER): Payer: Self-pay

## 2021-03-31 ENCOUNTER — Other Ambulatory Visit: Payer: Self-pay

## 2021-03-31 ENCOUNTER — Ambulatory Visit (HOSPITAL_BASED_OUTPATIENT_CLINIC_OR_DEPARTMENT_OTHER)
Admission: RE | Admit: 2021-03-31 | Discharge: 2021-03-31 | Disposition: A | Discharge status: Home / Self Care | Payer: 59 | Source: Ambulatory Visit | Attending: Obstetrics & Gynecology | Admitting: Obstetrics & Gynecology

## 2021-03-31 DIAGNOSIS — Z1231 Encounter for screening mammogram for malignant neoplasm of breast: Secondary | ICD-10-CM | POA: Diagnosis not present

## 2021-03-31 IMAGING — MG MM DIGITAL SCREENING BILAT W/ TOMO AND CAD
8 series · 9 of 24 positions shown · non-contrast
Comparison: Previous exam(s).

CLINICAL DATA: Screening.

EXAM:
DIGITAL SCREENING BILATERAL MAMMOGRAM WITH TOMOSYNTHESIS AND CAD
TECHNIQUE: Bilateral screening digital craniocaudal and mediolateral oblique
mammograms were obtained. Bilateral screening digital breast
tomosynthesis was performed. The images were evaluated with
computer-aided detection.

[L CC synth-2D]
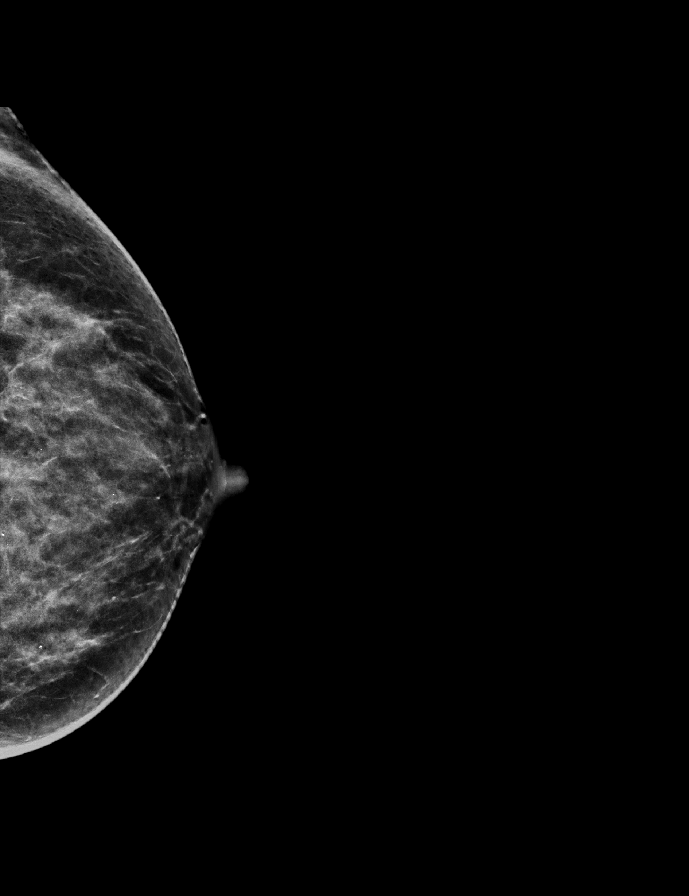

[L MLO synth-2D]
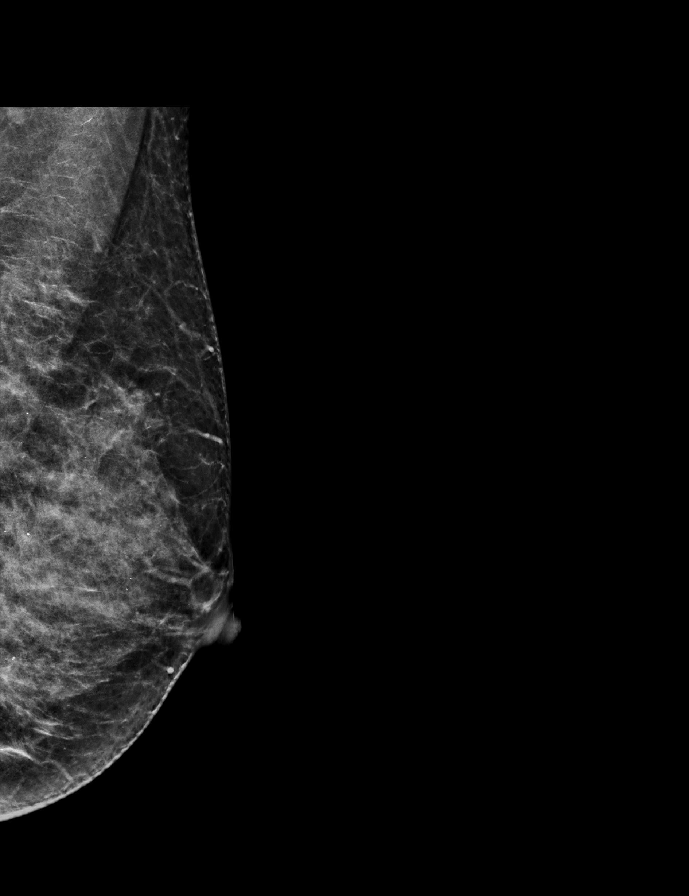

[R CC synth-2D]
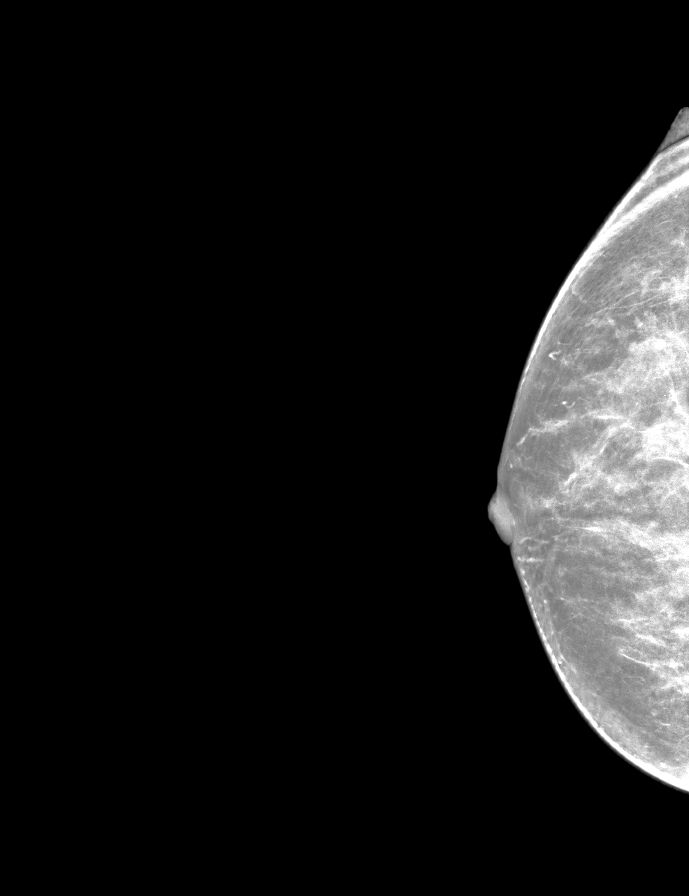

[R MLO synth-2D]
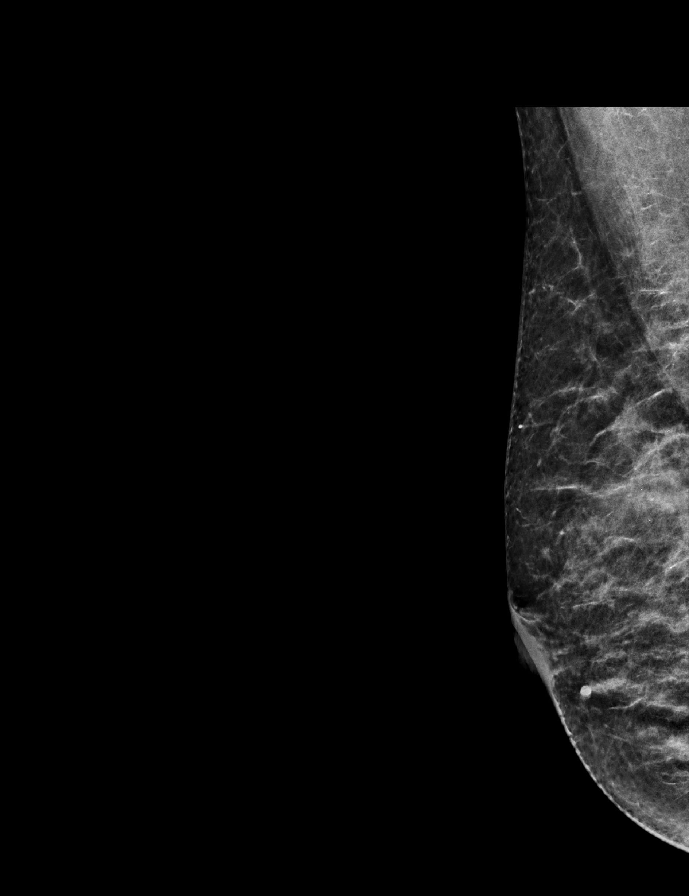

[L CC tomo · 2 of 58 frames shown]
[frame 19/58]
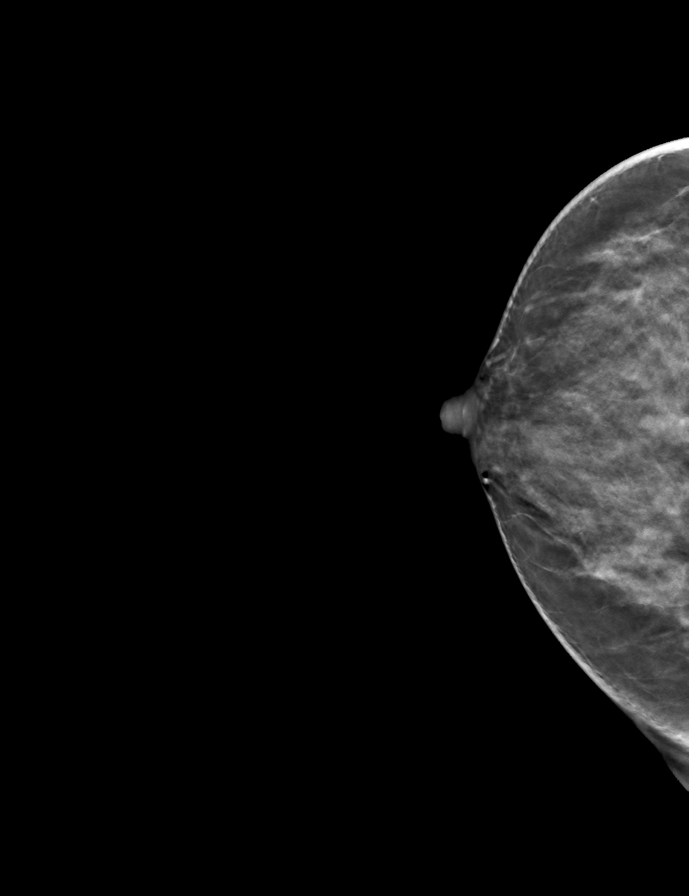
[frame 29/58]
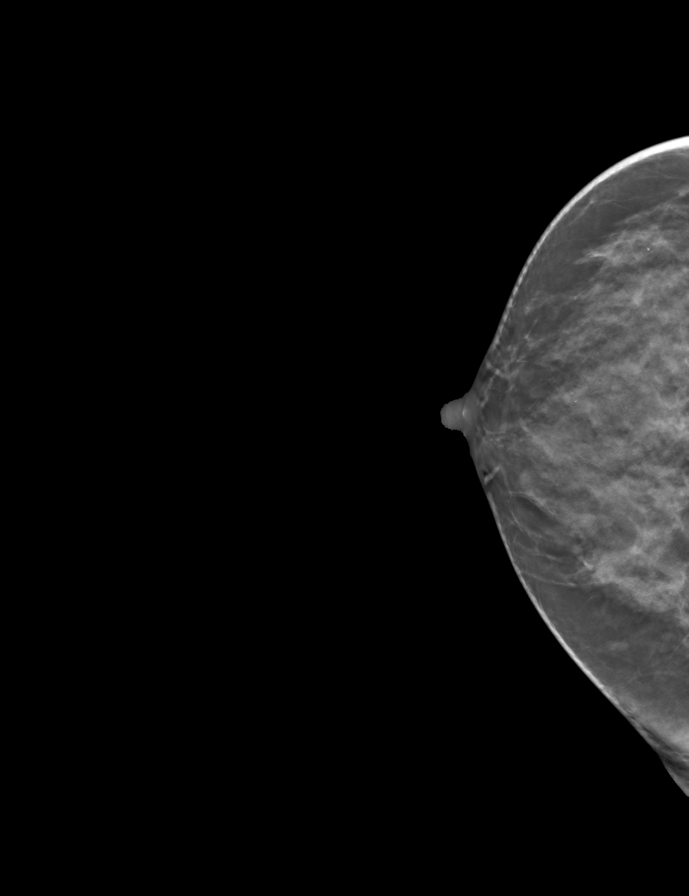

[L MLO tomo · tomo slice 29/58.0]
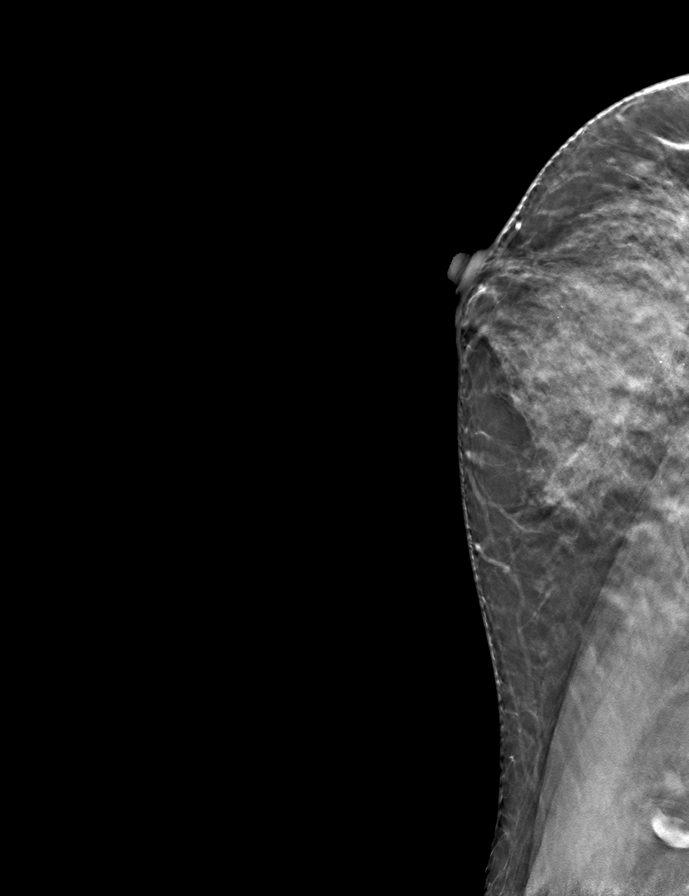

[R CC tomo · tomo slice 31/61.0]
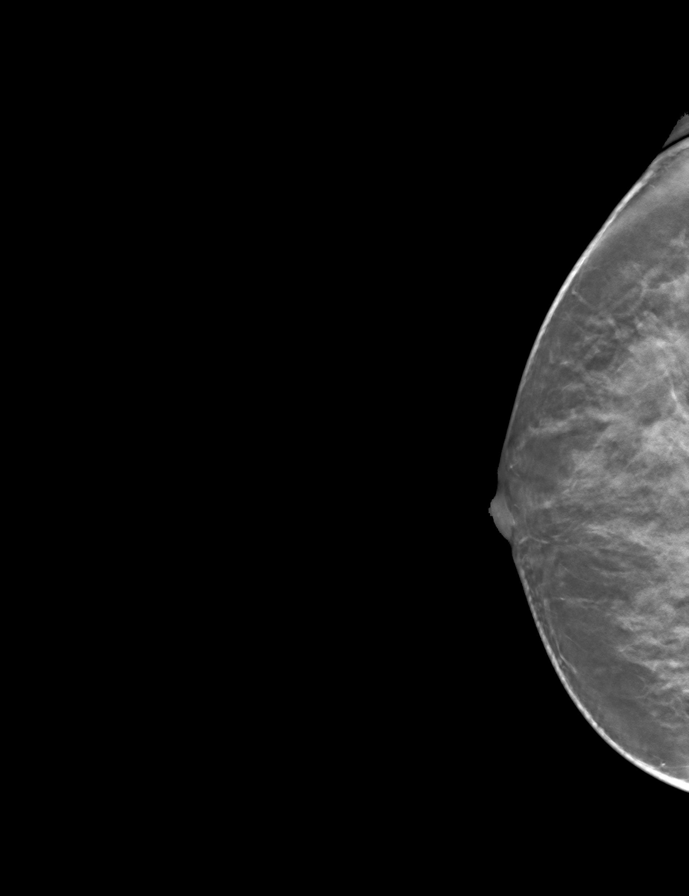

[R MLO tomo · tomo slice 29/56.0]
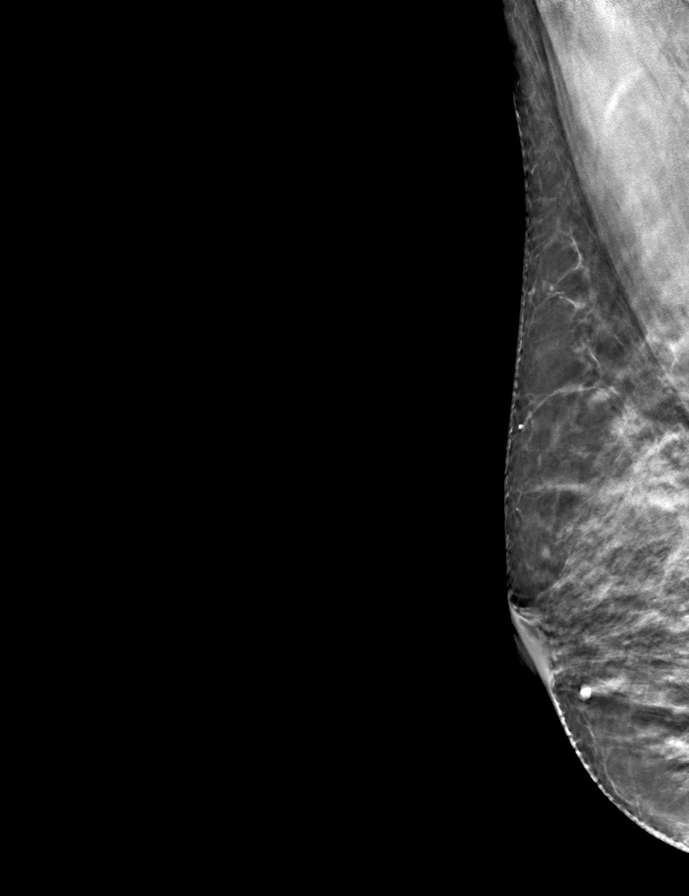

[9 of 24 positions shown; findings below may reference images not displayed]

ACR Breast Density Category d: The breast tissue is extremely dense,
which lowers the sensitivity of mammography
FINDINGS: There are no findings suspicious for malignancy. The images were
evaluated with computer-aided detection.
IMPRESSION: No mammographic evidence of malignancy. A result letter of this
screening mammogram will be mailed directly to the patient.

RECOMMENDATION:
Screening mammogram in one year. (Code:[1T])

BI-RADS CATEGORY  1: Negative.

## 2021-05-04 NOTE — Progress Notes (Signed)
[name] Phone: [phone] Subjective:   [name], am serving as a scribe for Dr. Hulan Saas. This visit occurred during the SARS-CoV-2 public health emergency.  Safety protocols were in place, including screening questions prior to the visit, additional usage of staff PPE, and extensive cleaning of exam room while observing appropriate contact time as indicated for disinfecting solutions.   I'm seeing this patient by the request  of:  [name], DO  CC: Knee pain left greater than right  [id number]   02/12/2021 Effusion is completely resolved at this time.  Patient did take antibiotics to be safe.  Patient is doing much better.  Patient though does have on ultrasound potential for lateral meniscal tear and may have a reaccumulation again.  Patient is a point though is not having any locking and giving out on her.  We discussed though if this did occur that she needs to come back.  Patient will otherwise will follow up with me again in 3 months.   Update 05/05/2021 Jeanne Davis is a 45 y.o. female coming in with complaint of L knee pain. Patient states that driving makes her leg stiff. Patient has been wearing mesh knee brace for support. Pain is throughout knee joint.  Patient also states that she notices that the right knee is starting to give her more different discomfort.  Patient after injection in January had near complete resolution of pain after 2 weeks.  Patient was doing well but started having reaccumulation again over the last 4 weeks.  Patient has not changed any of her activities, denies any change in her diet.     History reviewed. No pertinent past medical history. Past Surgical History:  Procedure Laterality Date  . WISDOM TOOTH EXTRACTION     Social History   Socioeconomic History  . Marital status: Single    Spouse name: Not on file  . Number of children: Not on  file  . Years of education: Not on file  . Highest education level: Not on file  Occupational History  . 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  Tobacco Use  . Smoking status: Never Smoker  . Smokeless tobacco: Never Used  Vaping Use  . Vaping Use: Never used  Substance and Sexual Activity  . Alcohol use: No  . Drug use: No  . Sexual activity: Yes    Partners: Male    Birth control/protection: None  Other Topics Concern  . Not on file  Social History Narrative   Exercise --- no   Social Determinants of Health   Financial Resource Strain: Not on file  Food Insecurity: Not on file  Transportation Needs: Not on file  Physical Activity: Not on file  Stress: Not on file  Social Connections: Not on file   No Known Allergies Family History  Problem Relation Age of Onset  . Coronary artery disease Father   . Hyperlipidemia Father   . Diabetes Father   . Hypertension Father   . Hyperlipidemia Mother   . Diabetes Sister        type I---half sister  . Diabetes Maternal Grandmother   . Heart disease Maternal Grandmother        CHF  . Breast cancer Maternal Aunt   . Cancer Maternal Aunt        breast ca  . Heart attack Maternal Aunt   . Cancer Maternal Aunt  fallopian tubes  . Cancer Maternal Uncle        tumor kidney, colon cancer  . Cancer Paternal Aunt         breast  . Breast cancer Paternal Aunt     Current Outpatient Medications (Endocrine & Metabolic):  Marland Kitchen  Norethindrone Acetate-Ethinyl Estradiol (LOESTRIN 1.5/30, 21,) 1.5-30 MG-MCG tablet, Take 1 tablet by mouth daily.   Current Outpatient Medications (Respiratory):  .  fluticasone (FLONASE) 50 MCG/ACT nasal spray, Place 2 sprays into both nostrils daily.  Current Outpatient Medications (Analgesics):  .  meloxicam (MOBIC) 15 MG tablet, Take 1 tablet (15 mg total) by mouth daily.   Current Outpatient Medications (Other):  Marland Kitchen  Lactic Ac-Citric Ac-Pot Bitart (PHEXXI) 1.8-1-0.4 % GEL, Place 1 application  vaginally daily. May use 2-3 times per week. .  valACYclovir (VALTREX) 1000 MG tablet, Take 1 tablet (1,000 mg total) by mouth daily.   Reviewed prior external information including notes and imaging from  primary care provider As well as notes that were available from care everywhere and other healthcare systems.  Past medical history, social, surgical and family history all reviewed in electronic medical record.  No pertanent information unless stated regarding to the chief complaint.   Review of Systems:  No headache, visual changes, nausea, vomiting, diarrhea, constipation, dizziness, abdominal pain, skin rash, fevers, chills, night sweats, weight loss, swollen lymph nodes, body aches, joint swelling, chest pain, shortness of breath, mood changes. POSITIVE muscle aches  Objective  Blood pressure 102/70, pulse 98, height 5\' 8"  (1.727 m), weight 141 lb (64 kg), last menstrual period 04/28/2021, SpO2 99 %.   General: No apparent distress alert and oriented x3 mood and affect normal, dressed appropriately.  HEENT: Pupils equal, extraocular movements intact  Respiratory: Patient's speak in full sentences and does not appear short of breath  Cardiovascular: No lower extremity edema, non tender, no erythema  Gait antalgic Left knee exam shows the patient does have significant swelling again noted of the patellofemoral joint.  Lacks last 10 degrees of flexion.  Patient is minimally tender.  Questionable instability with valgus and varus force that is different than previous exam.  Right knee also has an effusion noted today.  Patient is tender to palpation at this point.  Patient lacks last 5 degrees of flexion of the knee.  No gross instability noted.  Procedure: Real-time Ultrasound Guided Injection of right knee Device: GE Logiq Q7 Ultrasound guided injection is preferred based studies that show increased duration, increased effect, greater accuracy, decreased procedural pain, increased  response rate, and decreased cost with ultrasound guided versus blind injection.  Verbal informed consent obtained.  Time-out conducted.  Noted no overlying erythema, induration, or other signs of local infection.  Skin prepped in a sterile fashion.  Local anesthesia: Topical Ethyl chloride.  With sterile technique and under real time ultrasound guidance: With a 22-gauge 2 inch needle patient was injected with 4 cc of 0.5% Marcaine and aspirated 50 cc of straw-colored fluid then injected 1 cc of Kenalog 40 mg/dL. This was from a superior lateral approach.  Completed without difficulty  Pain immediately resolved suggesting accurate placement of the medication.  Advised to call if fevers/chills, erythema, induration, drainage, or persistent bleeding.  Impression: Technically successful ultrasound guided injection.   Impression and Recommendations:     The above documentation has been reviewed and is accurate and complete Lyndal Pulley, DO \

## 2021-05-05 ENCOUNTER — Ambulatory Visit: Payer: Self-pay

## 2021-05-05 ENCOUNTER — Ambulatory Visit (INDEPENDENT_AMBULATORY_CARE_PROVIDER_SITE_OTHER): Payer: 59

## 2021-05-05 ENCOUNTER — Other Ambulatory Visit: Payer: Self-pay

## 2021-05-05 ENCOUNTER — Ambulatory Visit (INDEPENDENT_AMBULATORY_CARE_PROVIDER_SITE_OTHER): Payer: 59 | Admitting: Family Medicine

## 2021-05-05 ENCOUNTER — Encounter: Payer: Self-pay | Admitting: Family Medicine

## 2021-05-05 VITALS — BP 102/70 | HR 98 | Ht 68.0 in | Wt 141.0 lb

## 2021-05-05 DIAGNOSIS — M25562 Pain in left knee: Secondary | ICD-10-CM

## 2021-05-05 DIAGNOSIS — M25561 Pain in right knee: Secondary | ICD-10-CM

## 2021-05-05 DIAGNOSIS — M25462 Effusion, left knee: Secondary | ICD-10-CM | POA: Diagnosis not present

## 2021-05-05 DIAGNOSIS — M2550 Pain in unspecified joint: Secondary | ICD-10-CM

## 2021-05-05 DIAGNOSIS — M25461 Effusion, right knee: Secondary | ICD-10-CM

## 2021-05-05 LAB — IBC PANEL
Iron: 68 ug/dL (ref 42–145)
Saturation Ratios: 14.8 % — ABNORMAL LOW (ref 20.0–50.0)
Transferrin: 329 mg/dL (ref 212.0–360.0)

## 2021-05-05 LAB — CBC WITH DIFFERENTIAL/PLATELET
Basophils Absolute: 0.1 10*3/uL (ref 0.0–0.1)
Basophils Relative: 1.2 % (ref 0.0–3.0)
Eosinophils Absolute: 0 10*3/uL (ref 0.0–0.7)
Eosinophils Relative: 1 % (ref 0.0–5.0)
HCT: 37.9 % (ref 36.0–46.0)
Hemoglobin: 13 g/dL (ref 12.0–15.0)
Lymphocytes Relative: 31.7 % (ref 12.0–46.0)
Lymphs Abs: 1.4 10*3/uL (ref 0.7–4.0)
MCHC: 34.2 g/dL (ref 30.0–36.0)
MCV: 93.5 fl (ref 78.0–100.0)
Monocytes Absolute: 0.3 10*3/uL (ref 0.1–1.0)
Monocytes Relative: 5.8 % (ref 3.0–12.0)
Neutro Abs: 2.7 10*3/uL (ref 1.4–7.7)
Neutrophils Relative %: 60.3 % (ref 43.0–77.0)
Platelets: 399 10*3/uL (ref 150.0–400.0)
RBC: 4.06 Mil/uL (ref 3.87–5.11)
RDW: 12.8 % (ref 11.5–15.5)
WBC: 4.6 10*3/uL (ref 4.0–10.5)

## 2021-05-05 LAB — COMPREHENSIVE METABOLIC PANEL
ALT: 13 U/L (ref 0–35)
AST: 14 U/L (ref 0–37)
Albumin: 4.2 g/dL (ref 3.5–5.2)
Alkaline Phosphatase: 71 U/L (ref 39–117)
BUN: 14 mg/dL (ref 6–23)
CO2: 23 mEq/L (ref 19–32)
Calcium: 9.4 mg/dL (ref 8.4–10.5)
Chloride: 104 mEq/L (ref 96–112)
Creatinine, Ser: 0.86 mg/dL (ref 0.40–1.20)
GFR: 82.12 mL/min (ref >60.00)
Glucose, Bld: 96 mg/dL (ref 70–99)
Potassium: 3.6 mEq/L (ref 3.5–5.1)
Sodium: 135 mEq/L (ref 135–145)
Total Bilirubin: 0.4 mg/dL (ref 0.2–1.2)
Total Protein: 7.4 g/dL (ref 6.0–8.3)

## 2021-05-05 LAB — VITAMIN D 25 HYDROXY (VIT D DEFICIENCY, FRACTURES): VITD: 12.89 ng/mL — ABNORMAL LOW (ref 30.00–100.00)

## 2021-05-05 LAB — C-REACTIVE PROTEIN: CRP: <1 mg/dL (ref 0.5–20.0)

## 2021-05-05 LAB — SEDIMENTATION RATE: Sed Rate: 34 mm/hr — ABNORMAL HIGH (ref 0–20)

## 2021-05-05 LAB — URIC ACID: Uric Acid, Serum: 5.4 mg/dL (ref 2.4–7.0)

## 2021-05-05 LAB — TSH: TSH: 0.6 u[IU]/mL (ref 0.35–4.50)

## 2021-05-05 IMAGING — DX DG KNEE 3 VIEWS*R*
3 series · 3 of 3 positions shown · non-contrast
Comparison: None.

CLINICAL DATA: Right knee pain, status post arthrocentesis

EXAM:
RIGHT KNEE - 3 VIEW

[knee ap]
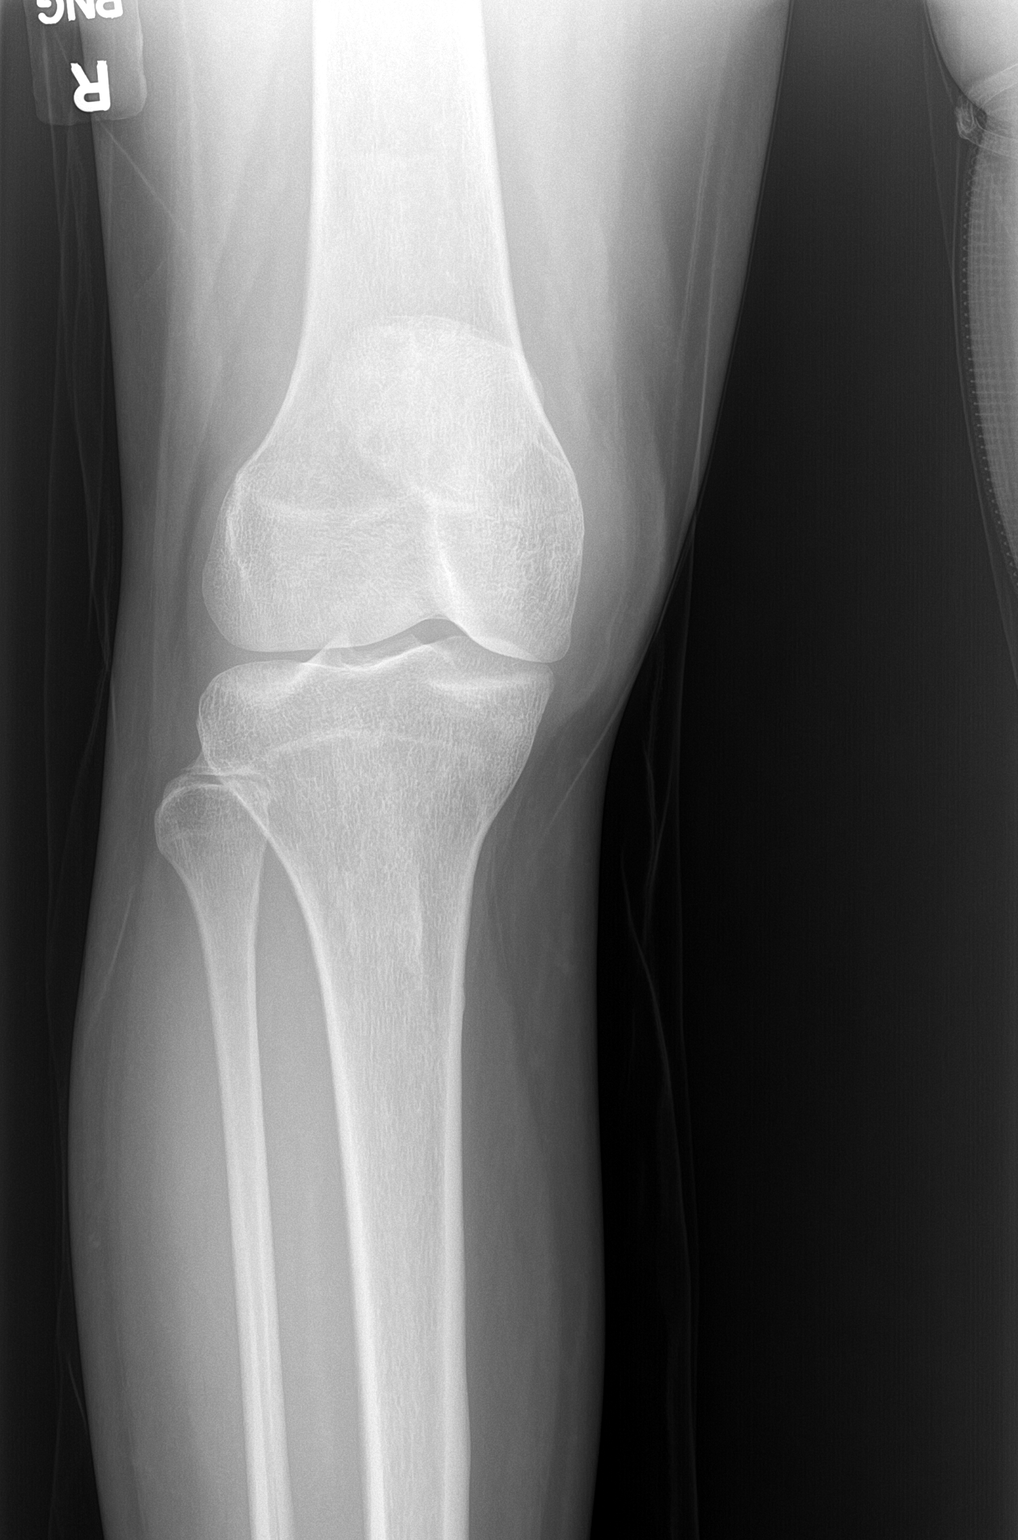

[knee lat]
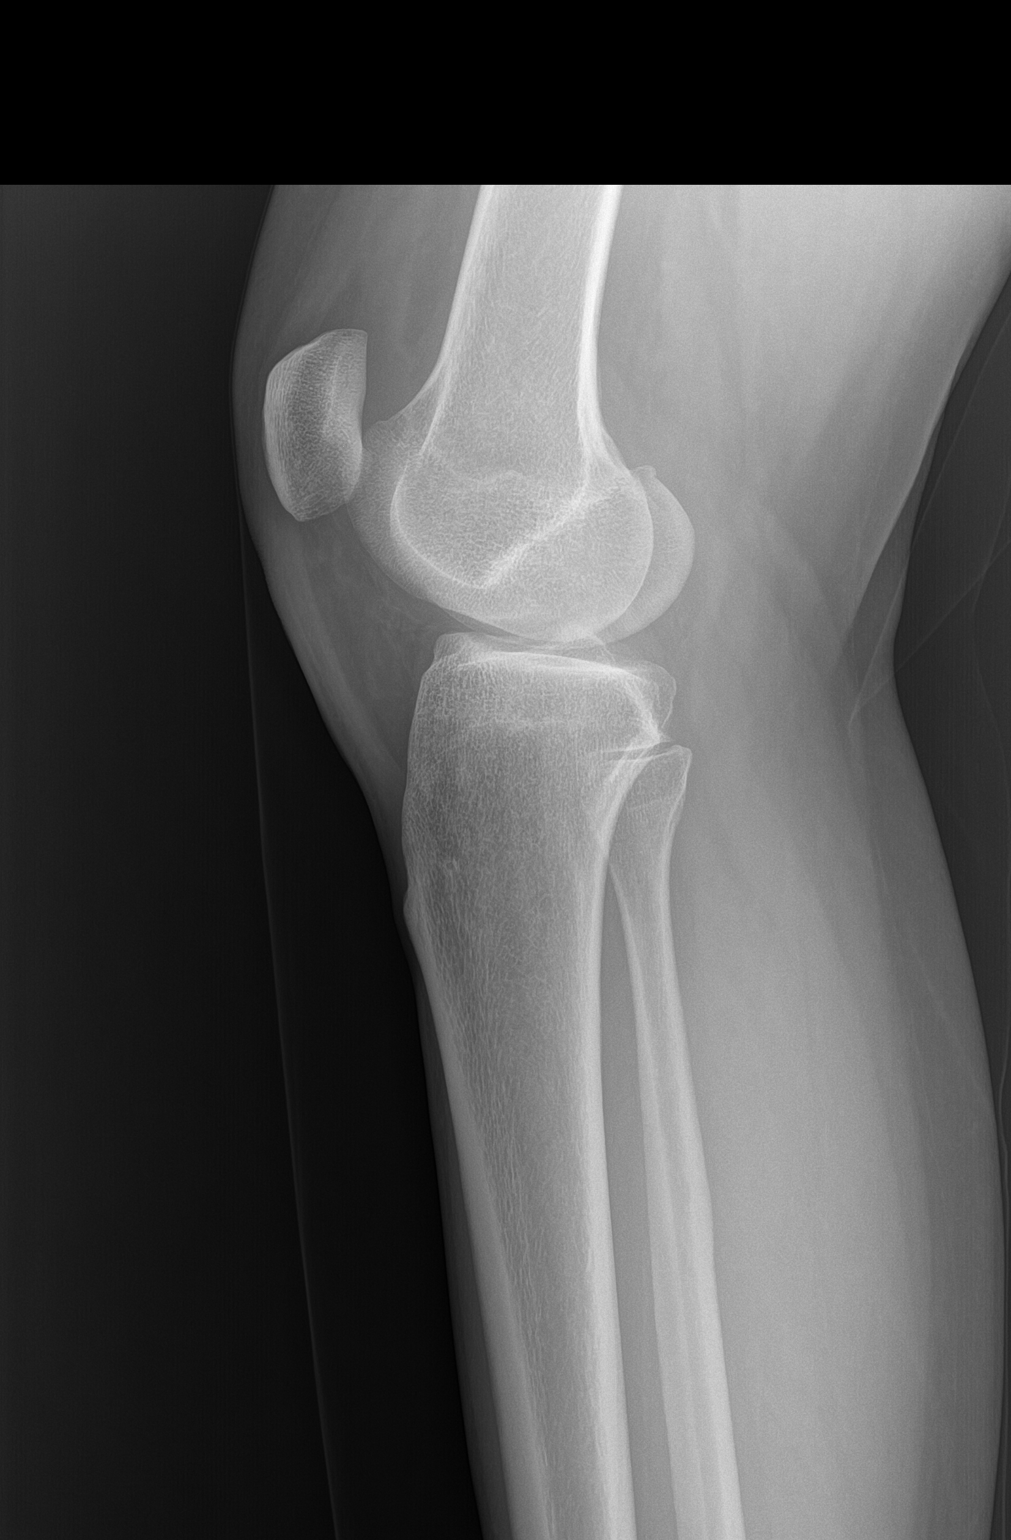

[patella]
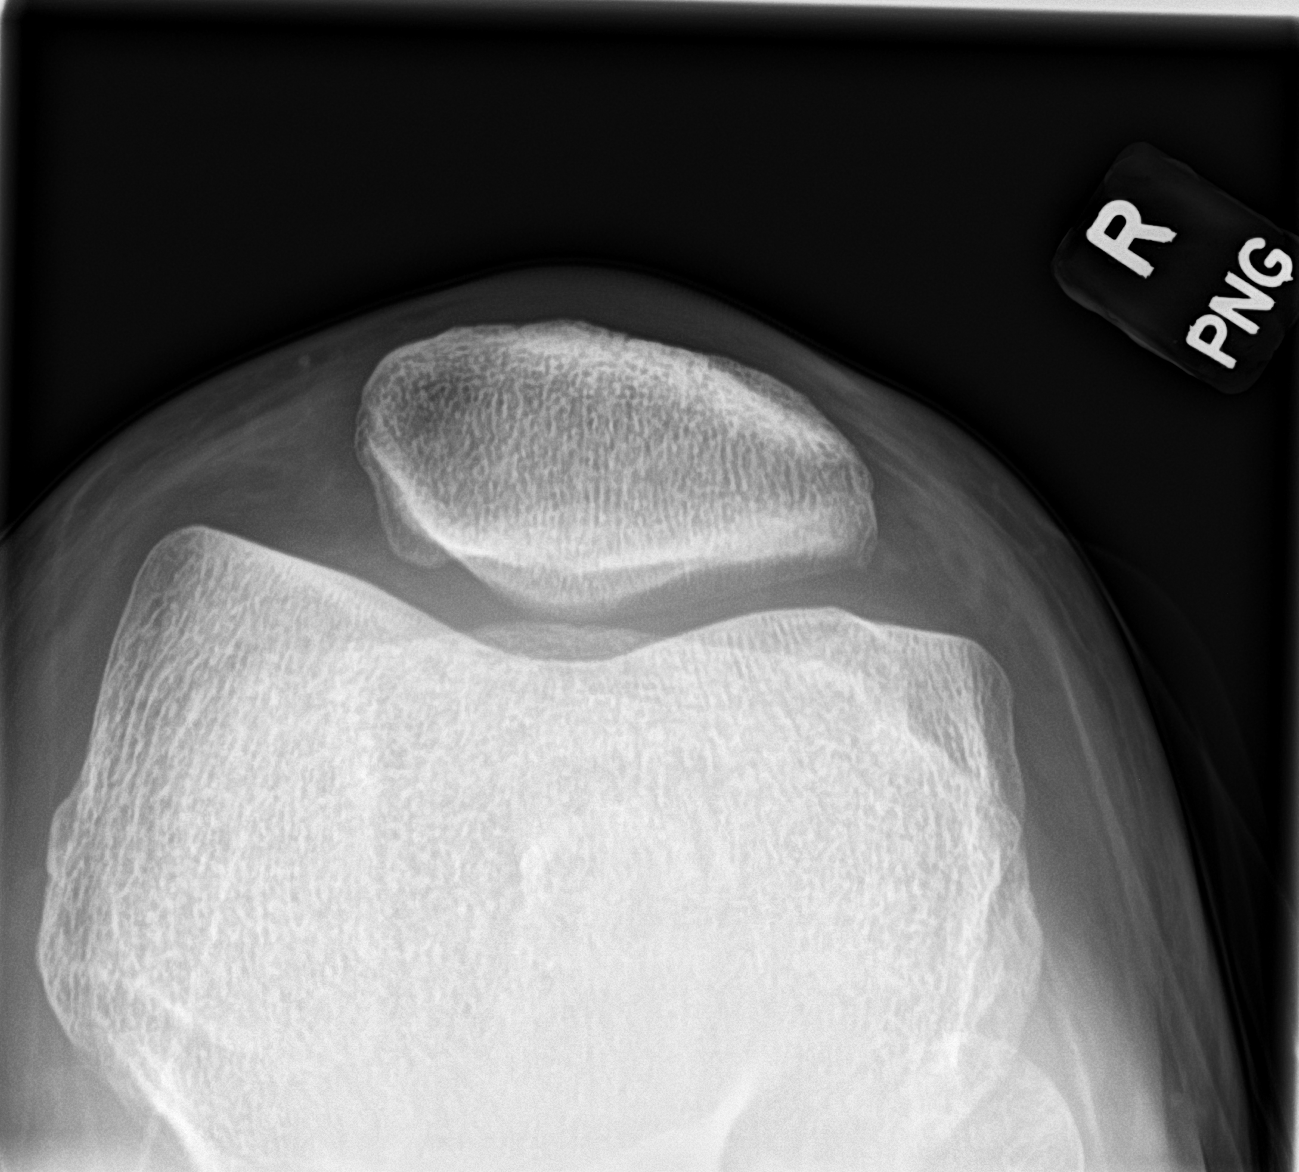

[3 of 3 positions shown; findings below may reference images not displayed]

FINDINGS: Frontal, lateral, and sunrise views of the right knee are obtained.
No fracture, subluxation, or dislocation. Joint spaces are well
preserved. There is small residual suprapatellar effusion. Gas in
the suprapatellar region consistent with recent arthrocentesis.
Remaining soft tissues are unremarkable.
IMPRESSION: 1. Postprocedural changes from arthrocentesis. Trace residual right
knee effusion.
2. No acute bony abnormality.

## 2021-05-05 NOTE — Assessment & Plan Note (Signed)
Recurrent knee effusion noted.  Patient has a significant amount of less than 4 months ago.  At this point I do feel that advanced imaging is warranted.  Patient is having reaccumulation with increasing instability of the knee.  We will further evaluate.  In addition this is secondary to the bilateral inflammation I do feel laboratory work-up it is reasonable to rule out autoimmune disease.  Patient agrees with the plan and will follow up with me again in 4 weeks.

## 2021-05-05 NOTE — Patient Instructions (Addendum)
MRI [facility]-[phone] R knee xray today Labs today See me in 4-5 weeks We will talk before then

## 2021-05-05 NOTE — Assessment & Plan Note (Signed)
Aspiration given today.  Tolerated the procedure well, discussed icing regimen and home exercises.  Depending on laboratory work-up we will discuss with any other changes as necessary.  Discussed potential compression sleeve.  No significant instability on the right compared to the left.

## 2021-05-07 LAB — ANTI-DNA ANTIBODY, DOUBLE-STRANDED: ds DNA Ab: <1 IU/mL

## 2021-05-07 LAB — ANA: Anti Nuclear Antibody (ANA): POSITIVE — AB

## 2021-05-07 LAB — CYCLIC CITRUL PEPTIDE ANTIBODY, IGG: Cyclic Citrullin Peptide Ab: <16 UNITS

## 2021-05-07 LAB — RHEUMATOID FACTOR: Rheumatoid fact SerPl-aCnc: <14 IU/mL (ref <14)

## 2021-05-07 LAB — CALCIUM, IONIZED: Calcium, Ion: 4.95 mg/dL (ref 4.8–5.6)

## 2021-05-07 LAB — ANTI-NUCLEAR AB-TITER (ANA TITER)
ANA TITER: 1:40 {titer} — ABNORMAL HIGH
ANA Titer 1: 1:80 {titer} — ABNORMAL HIGH

## 2021-05-07 LAB — PTH, INTACT AND CALCIUM
Calcium: 9.7 mg/dL (ref 8.6–10.2)
PTH: 23 pg/mL (ref 16–77)

## 2021-05-07 LAB — ANGIOTENSIN CONVERTING ENZYME: Angiotensin-Converting Enzyme: 18 U/L (ref 9–67)

## 2021-05-14 ENCOUNTER — Encounter: Payer: Self-pay | Admitting: Family Medicine

## 2021-05-15 ENCOUNTER — Ambulatory Visit
Admission: RE | Admit: 2021-05-15 | Discharge: 2021-05-15 | Disposition: A | Discharge status: Home / Self Care | Payer: 59 | Source: Ambulatory Visit | Attending: Family Medicine | Admitting: Family Medicine

## 2021-05-15 ENCOUNTER — Other Ambulatory Visit: Payer: 59

## 2021-05-15 ENCOUNTER — Other Ambulatory Visit: Payer: Self-pay

## 2021-05-15 DIAGNOSIS — M25562 Pain in left knee: Secondary | ICD-10-CM

## 2021-05-15 IMAGING — MR MR KNEE*L* W/O CM
6 of 8 series · 25 of 40 positions shown · non-contrast
Comparison: None.

CLINICAL DATA: Left knee pain. Instability.

EXAM:
MRI OF THE LEFT KNEE WITHOUT CONTRAST
TECHNIQUE: Multiplanar, multisequence MR imaging of the knee was performed. No
intravenous contrast was administered.

[Series 3: T2 fat-sat · axial · 4.0mm · 0.47mm/px · z∈[-84,+30]mm · 6 of 24 slices shown (1 of 3)]
[im 1/24]
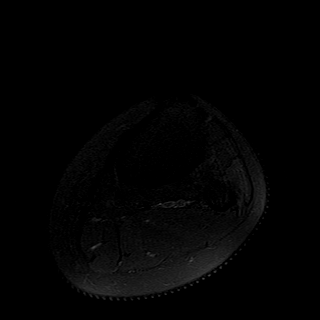
[im 5/24]
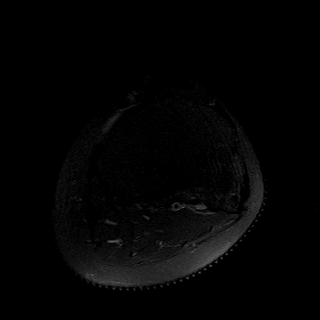
[im 10/24]
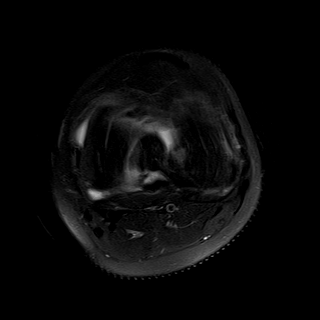
[im 14/24]
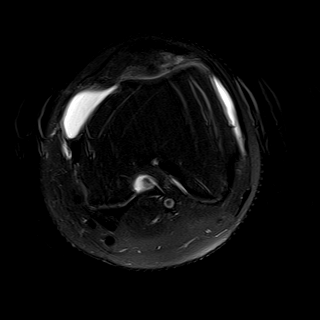
[im 19/24]
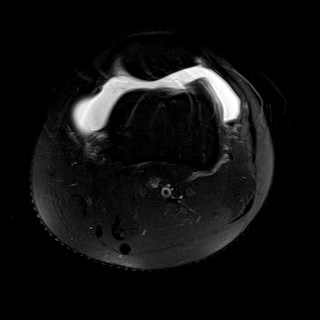
[im 24/24]
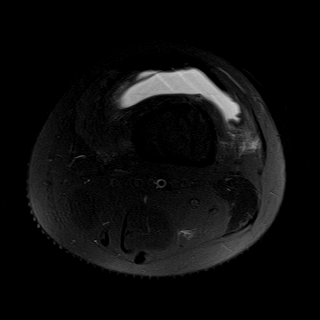

[Series 5: T2 fat-sat · axial · 4.0mm · 0.47mm/px · z∈[-84,+30]mm · 5 of 24 slices shown (2 of 3)]
[im 1/24]
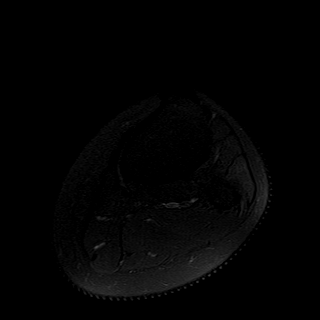
[im 6/24]
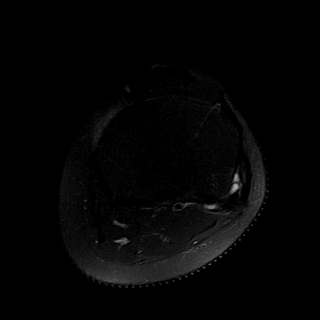
[im 12/24]
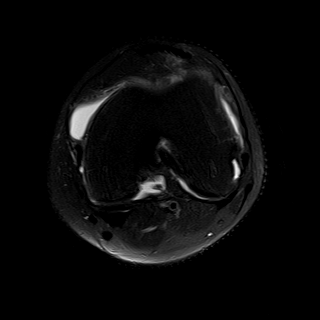
[im 18/24]
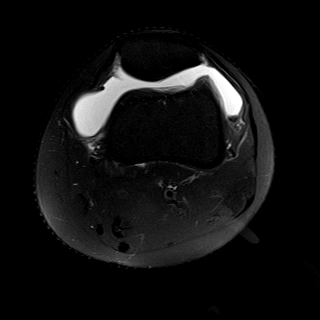
[im 24/24]
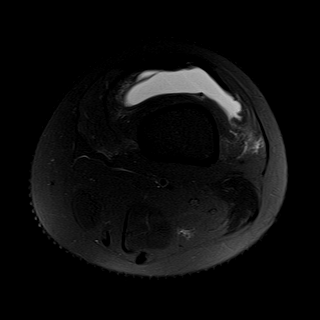

[Series 6: T2 fat-sat · coronal · 4.0mm · 0.29mm/px · 1 of 26 slices shown (3 of 3)]
[im 1/26]
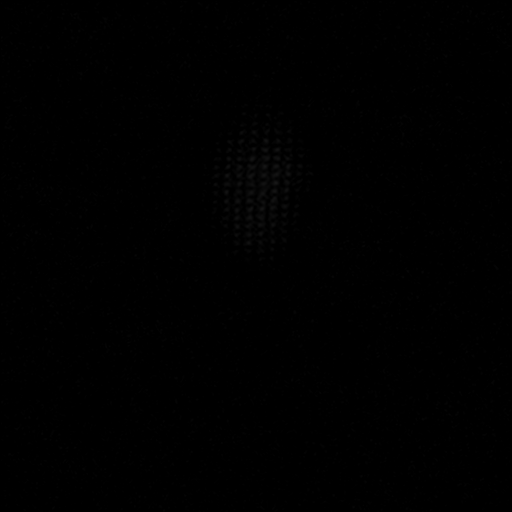

[Series 8: PD fat-sat · sagittal · 3.0mm · 0.29mm/px · 6 of 28 slices shown (1 of 3)]
[im 1/28]
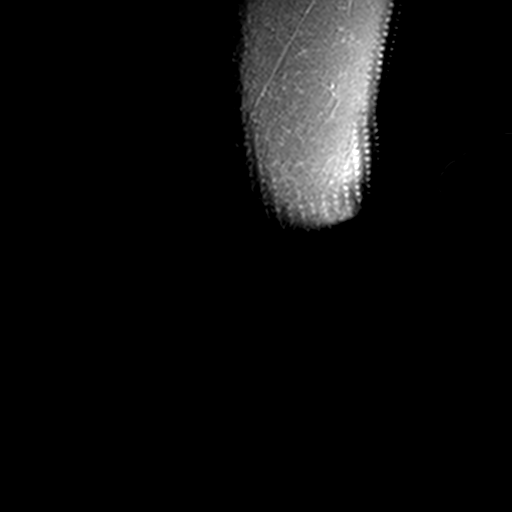
[im 6/28]
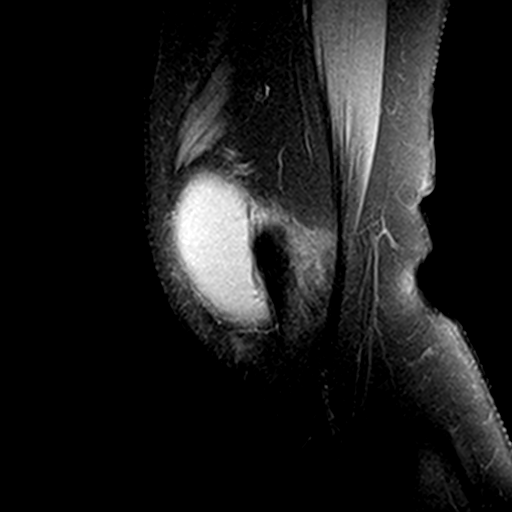
[im 11/28]
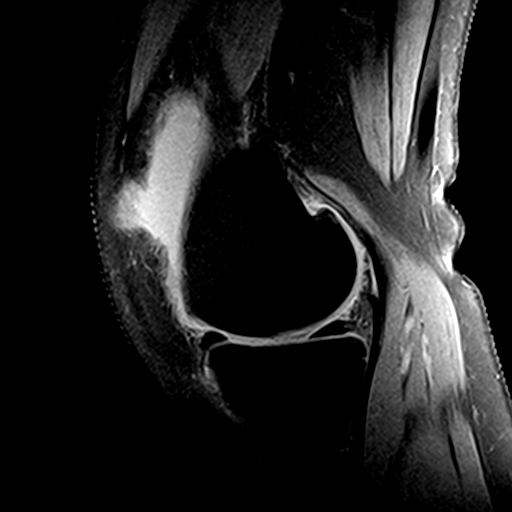
[im 17/28]
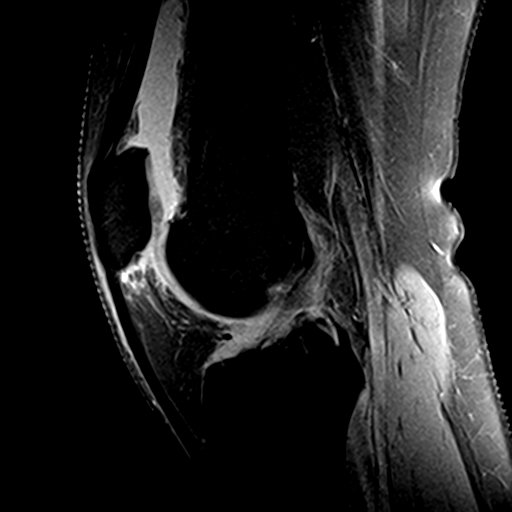
[im 22/28]
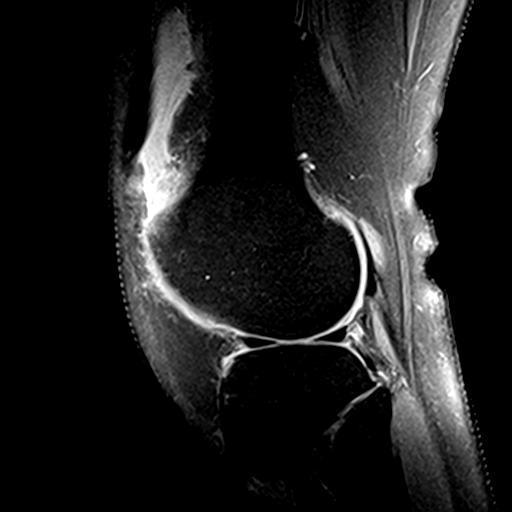
[im 28/28]
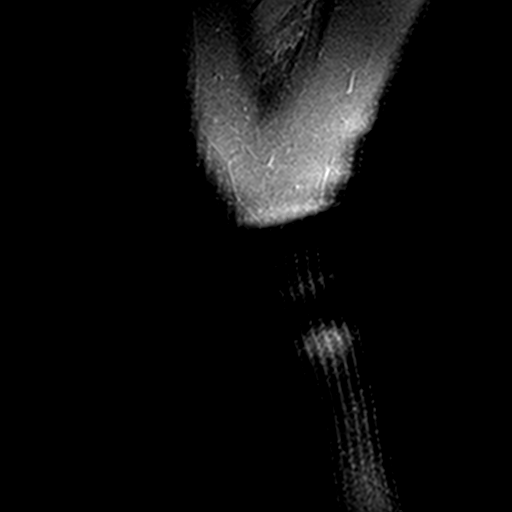

[Series 9: PD fat-sat · coronal · 4.0mm · 0.29mm/px · 5 of 26 slices shown (2 of 3)]
[im 1/26]
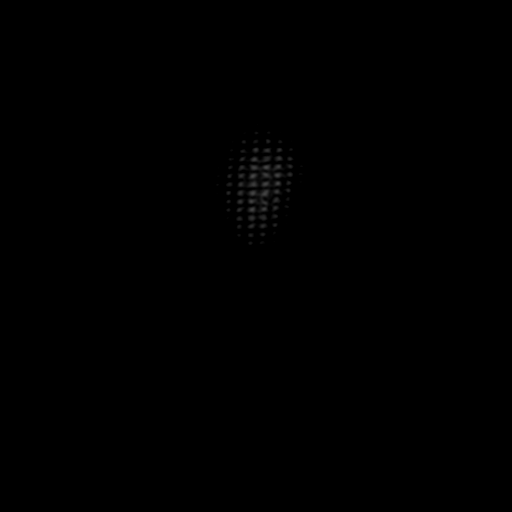
[im 7/26]
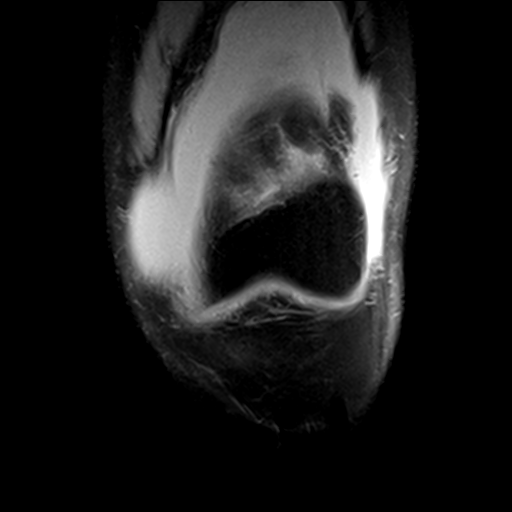
[im 13/26]
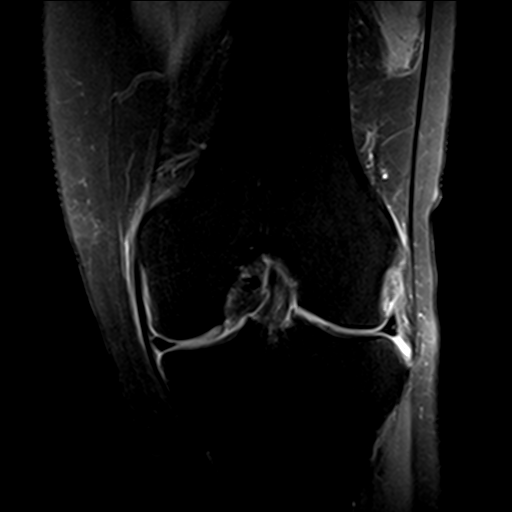
[im 19/26]
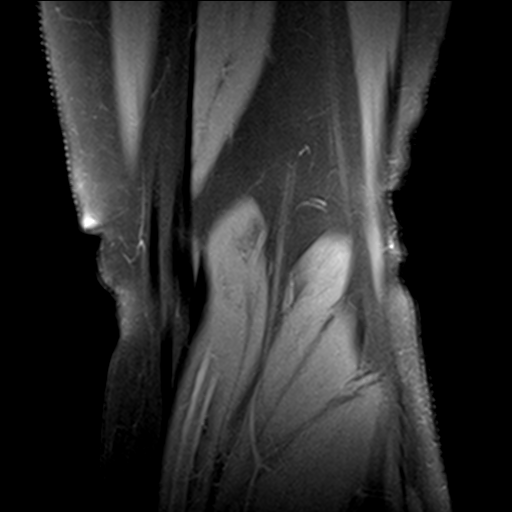
[im 26/26]
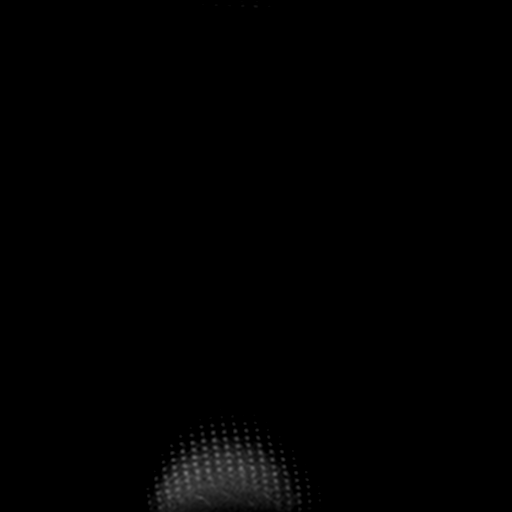

[Series 10: PD fat-sat · coronal · 2.3mm · 0.29mm/px · 2 of 11 slices shown (3 of 3)]
[im 1/11]
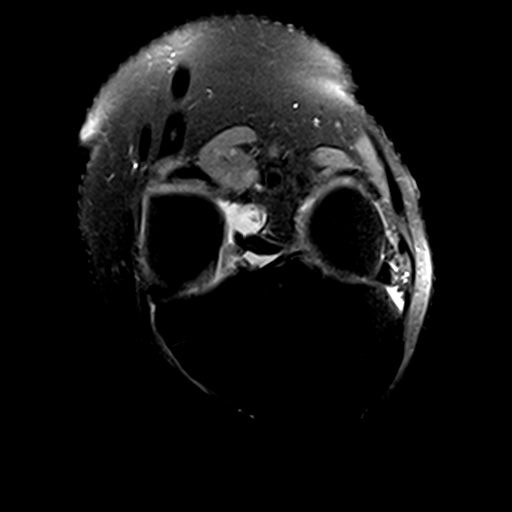
[im 11/11]
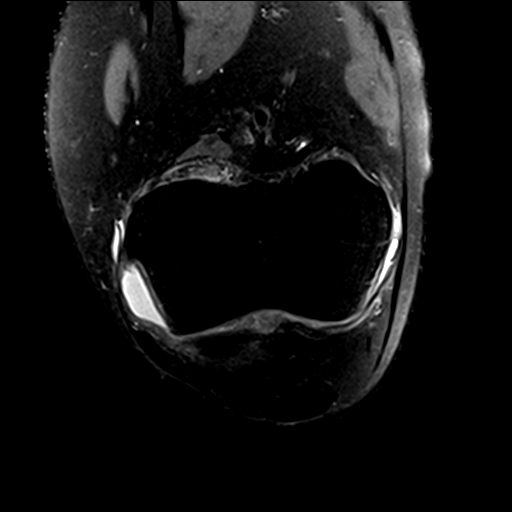

[25 of 40 positions shown; findings below may reference images not displayed]

FINDINGS: MENISCI

Medial: Intact.

Lateral: Intact.

LIGAMENTS

Cruciates: ACL and PCL are intact.

Collaterals: Medial collateral ligament is intact. Lateral
collateral ligament complex is intact.

CARTILAGE

Patellofemoral:  No chondral defect.

Medial: High-grade partial-thickness cartilage loss of the medial
femorotibial compartment with areas of full-thickness cartilage
loss.

Lateral:  Mild chondral thinning. No focal chondral defect.

JOINT: Large joint effusion . Edema in superolateral Hoffa's fat. No
plical thickening.

POPLITEAL FOSSA: Popliteus tendon is intact. No Baker's cyst.

EXTENSOR MECHANISM: Intact quadriceps tendon. Intact patellar
tendon. Intact lateral patellar retinaculum. Intact medial patellar
retinaculum. Intact MPFL.

BONES: No aggressive osseous lesion. No fracture or dislocation.

Other: No fluid collection or hematoma. Muscles are normal.
IMPRESSION: 1. High-grade partial-thickness cartilage loss of the medial
femorotibial compartment with areas of full-thickness cartilage
loss.
2. Edema in superolateral Hoffa's fat as can be seen with patellar
tendon-lateral femoral condyle friction syndrome.
3. Large joint effusion.

## 2021-06-01 ENCOUNTER — Ambulatory Visit: Payer: 59 | Admitting: Obstetrics & Gynecology

## 2021-06-08 ENCOUNTER — Encounter: Payer: Self-pay | Admitting: Family Medicine

## 2021-06-08 ENCOUNTER — Ambulatory Visit: Payer: Self-pay

## 2021-06-08 ENCOUNTER — Other Ambulatory Visit: Payer: Self-pay

## 2021-06-08 ENCOUNTER — Ambulatory Visit (INDEPENDENT_AMBULATORY_CARE_PROVIDER_SITE_OTHER): Payer: 59 | Admitting: Family Medicine

## 2021-06-08 VITALS — BP 102/68 | HR 63 | Ht 68.0 in | Wt 142.0 lb

## 2021-06-08 DIAGNOSIS — M25462 Effusion, left knee: Secondary | ICD-10-CM

## 2021-06-08 DIAGNOSIS — E559 Vitamin D deficiency, unspecified: Secondary | ICD-10-CM | POA: Diagnosis not present

## 2021-06-08 DIAGNOSIS — R768 Other specified abnormal immunological findings in serum: Secondary | ICD-10-CM | POA: Diagnosis not present

## 2021-06-08 DIAGNOSIS — G8929 Other chronic pain: Secondary | ICD-10-CM | POA: Diagnosis not present

## 2021-06-08 DIAGNOSIS — M25562 Pain in left knee: Secondary | ICD-10-CM | POA: Diagnosis not present

## 2021-06-08 DIAGNOSIS — R7689 Other specified abnormal immunological findings in serum: Secondary | ICD-10-CM | POA: Insufficient documentation

## 2021-06-08 MED ORDER — DOXYCYCLINE HYCLATE 100 MG PO TABS: 100.0000 mg | ORAL_TABLET | Freq: Two times a day (BID) | ORAL | 0 refills | Status: DC

## 2021-06-08 MED ORDER — VITAMIN D (ERGOCALCIFEROL) 1.25 MG (50000 UNIT) PO CAPS: 50000.0000 [IU] | ORAL_CAPSULE | ORAL | 0 refills | Status: DC

## 2021-06-08 NOTE — Patient Instructions (Signed)
Gel Vit D See me in 6 weeks

## 2021-06-08 NOTE — Progress Notes (Signed)
[place] Phone: [phone] Subjective:   [name], am serving as a scribe for Dr. Hulan Saas.  This visit occurred during the SARS-CoV-2 public health emergency.  Safety protocols were in place, including screening questions prior to the visit, additional usage of staff PPE, and extensive cleaning of exam room while observing appropriate contact time as indicated for disinfecting solutions.   I'm seeing this patient by the request  of:  [name], DO  CC: Knee pain follow-up  [id number]  05/05/2021 Aspiration given today.  Tolerated the procedure well, discussed icing regimen and home exercises.  Depending on laboratory work-up we will discuss with any other changes as necessary.  Discussed potential compression sleeve.  No significant instability on the right compared to the left.  Recurrent knee effusion noted.  Patient has a significant amount of less than 4 months ago.  At this point I do feel that advanced imaging is warranted.  Patient is having reaccumulation with increasing instability of the knee.  We will further evaluate.  In addition this is secondary to the bilateral inflammation I do feel laboratory work-up it is reasonable to rule out autoimmune disease.  Patient agrees with the plan and will follow up with me again in 4 weeks.  Update 06/08/2021 REKITA MIOTKE is a 45 y.o. female coming in with complaint of R knee pain has improved since draining last visit. L knee remains swollen and painful. Pain is throughout entire joint constantly. Using compression and topical analgesic for pain relief.  MRI L knee 05/15/2021   IMPRESSION: 1. High-grade partial-thickness cartilage loss of the medial femorotibial compartment with areas of full-thickness cartilage loss. 2. Edema in superolateral Hoffa's fat as can be seen with patellar tendon-lateral femoral condyle friction syndrome. 3.  Large joint effusion.  R knee xray 05/05/2021 IMPRESSION: 1. Postprocedural changes from arthrocentesis. Trace residual right knee effusion. 2. No acute bony abnormality.      Was found to have low vitamin D  Found to have a positive ANA but low titer but was also found to have an elevated sedimentation rate  No past medical history on file. Past Surgical History:  Procedure Laterality Date   WISDOM TOOTH EXTRACTION     Social History   Socioeconomic History   Marital status: Single    Spouse name: Not on file   Number of children: Not on file   Years of education: Not on file   Highest education level: Not on file  Occupational History   Occupation: dept [place]  Tobacco Use   Smoking status: Never   Smokeless tobacco: Never  Vaping Use   Vaping Use: Never used  Substance and Sexual Activity   Alcohol use: No   Drug use: No   Sexual activity: Yes    Partners: Male    Birth control/protection: None  Other Topics Concern   Not on file  Social History Narrative   Exercise --- no   Social Determinants of Health   Financial Resource Strain: Not on file  Food Insecurity: Not on file  Transportation Needs: Not on file  Physical Activity: Not on file  Stress: Not on file  Social Connections: Not on file   No Known Allergies Family History  Problem Relation Age of Onset   Coronary artery disease Father    Hyperlipidemia Father    Diabetes Father    Hypertension Father    Hyperlipidemia  Mother    Diabetes Sister        type I---half sister   Diabetes Maternal Grandmother    Heart disease Maternal Grandmother        CHF   Breast cancer Maternal Aunt    Cancer Maternal Aunt        breast ca   Heart attack Maternal Aunt    Cancer Maternal Aunt        fallopian tubes   Cancer Maternal Uncle        tumor kidney, colon cancer   Cancer Paternal Aunt         breast   Breast cancer Paternal Aunt     Current Outpatient Medications (Endocrine &  Metabolic):    Norethindrone Acetate-Ethinyl Estradiol (LOESTRIN 1.5/30, 21,) 1.5-30 MG-MCG tablet, Take 1 tablet by mouth daily.   Current Outpatient Medications (Respiratory):    fluticasone (FLONASE) 50 MCG/ACT nasal spray, Place 2 sprays into both nostrils daily.    Current Outpatient Medications (Other):    doxycycline (VIBRA-TABS) 100 MG tablet, Take 1 tablet (100 mg total) by mouth 2 (two) times daily.   Lactic Ac-Citric Ac-Pot Bitart (PHEXXI) 1.8-1-0.4 % GEL, Place 1 application vaginally daily. May use 2-3 times per week.   valACYclovir (VALTREX) 1000 MG tablet, Take 1 tablet (1,000 mg total) by mouth daily.   Vitamin D, Ergocalciferol, (DRISDOL) 1.25 MG (50000 UNIT) CAPS capsule, Take 1 capsule (50,000 Units total) by mouth every 7 (seven) days.   Reviewed prior external information including notes and imaging from  primary care provider As well as notes that were available from care everywhere and other healthcare systems.  Past medical history, social, surgical and family history all reviewed in electronic medical record.  No pertanent information unless stated regarding to the chief complaint.   Review of Systems:  No headache, visual changes, nausea, vomiting, diarrhea, constipation, dizziness, abdominal pain, skin rash, fevers, chills, night sweats, weight loss, swollen lymph nodes, body aches, joint swelling, chest pain, shortness of breath, mood changes. POSITIVE muscle aches  Objective  Blood pressure 102/68, pulse 63, height 5\' 8"  (1.727 m), weight 142 lb (64.4 kg), SpO2 99 %.   General: No apparent distress alert and oriented x3 mood and affect normal, dressed appropriately.  HEENT: Pupils equal, extraocular movements intact  Respiratory: Patient's speak in full sentences and does not appear short of breath  Cardiovascular: No lower extremity edema, non tender, no erythema  Gait normal with good balance and coordination.  MSK: Left knee does show effusion noted.   Lacks the last 10 degrees of flexion secondary to the amount of fluid in the patellofemoral joint.  No pain in the calf.  Patient's patella does seem to lateral track.  Procedure: Real-time Ultrasound Guided Injection of left knee Device: GE Logiq Q7 Ultrasound guided injection is preferred based studies that show increased duration, increased effect, greater accuracy, decreased procedural pain, increased response rate, and decreased cost with ultrasound guided versus blind injection.  Verbal informed consent obtained.  Time-out conducted.  Noted no overlying erythema, induration, or other signs of local infection.  Skin prepped in a sterile fashion.  Local anesthesia: Topical Ethyl chloride.  With sterile technique and under real time ultrasound guidance: With a 22-gauge 2 inch needle patient was injected with 2 cc of 0.5% Marcaine and aspirated 40 cc of straw light-colored fluid that did have a darker appearance.  Then injected 1 cc of Kenalog 40 mg/dL. This was from a superior lateral approach.  Completed  without difficulty  Pain immediately resolved suggesting accurate placement of the medication.  Advised to call if fevers/chills, erythema, induration, drainage, or persistent bleeding.  Impression: Technically successful ultrasound guided injection.    Impression and Recommendations:     The above documentation has been reviewed and is accurate and complete Lyndal Pulley, DO

## 2021-06-08 NOTE — Assessment & Plan Note (Addendum)
Positive ANA with polyarthralgia and recurrent effusions.  Awaiting rheumatology

## 2021-06-08 NOTE — Assessment & Plan Note (Signed)
Patient placed on once weekly vitamin D supplementation for the next 12 weeks and then will recheck.

## 2021-06-08 NOTE — Assessment & Plan Note (Signed)
Patient did have reaccumulation of the effusion of the left knee.  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  Aspiration done again today to see if this would help.  Unfortunately concerned that there is potentially autoimmune activity contributing as well.  Given doxycycline secondary to the darkness of the effusion noted today.  Seem to be a little bit different than patient's baseline previously.  Unfortunately was unable to send to lab for further evaluation.  Patient will follow up with me in 6 weeks.  Would be a candidate for viscosupplementation and we will try to get approval.

## 2021-07-17 ENCOUNTER — Other Ambulatory Visit: Payer: Self-pay

## 2021-07-17 ENCOUNTER — Encounter: Payer: Self-pay | Admitting: Internal Medicine

## 2021-07-17 ENCOUNTER — Ambulatory Visit: Payer: Self-pay

## 2021-07-17 ENCOUNTER — Ambulatory Visit: Payer: 59 | Admitting: Internal Medicine

## 2021-07-17 VITALS — BP 116/74 | HR 78 | Ht 68.0 in | Wt 153.0 lb

## 2021-07-17 DIAGNOSIS — M25462 Effusion, left knee: Secondary | ICD-10-CM

## 2021-07-17 DIAGNOSIS — M79644 Pain in right finger(s): Secondary | ICD-10-CM | POA: Diagnosis not present

## 2021-07-17 DIAGNOSIS — M25461 Effusion, right knee: Secondary | ICD-10-CM | POA: Diagnosis not present

## 2021-07-17 DIAGNOSIS — R768 Other specified abnormal immunological findings in serum: Secondary | ICD-10-CM | POA: Diagnosis not present

## 2021-07-17 NOTE — Progress Notes (Signed)
Office Visit Note  Patient: Jeanne Davis             Date of Birth: 1976/10/04           MRN: [id number]             PCP: [name], DO Referring: Lyndal Pulley, DO Visit Date: 07/17/2021 Occupation: Social work  Subjective:  Other (Patient reports bilateral knee pain and stiffness, right hand pain/swelling/stiffness )   History of Present Illness: Jeanne Davis is a 45 y.o. female here for positive ANA with recurrent joint pain and effusion particular in knees but also describes development of some increased right thumb pain and generalized stiffness throughout the day.  She recalls feeling in usual health until around December 2021 started having increased pain and stiffness particularly at the left knee.  She saw her PCP for this with trial of oral anti-inflammatory medicine that did not help much.  She saw Dr. Tamala Julian for evaluation had knee aspiration injection with good improvement of symptoms.  However she developed similar pain and swelling to a lesser extent in the right knee also redevelopment of the left knee effusion.  She underwent injection and aspiration of both knees a few months later again with good initial symptom improvement.  She went for imaging including x-ray and MRI of the left knee demonstrating medial meniscal tear and large effusion.  Per reviewed office note fluid aspiration of the left knee in June showed a change from bland appearance to darker color at that time and received a course of oral doxycycline.  Laboratory work-up was positive for ANA and elevated sedimentation rate.   Labs reviewed ANA 1:80 cytoplasmic/discrete dots 1:40 speckled dsDNA neg RF neg CCP neg ESR 34 CRP wnl CBC wnl CMP wnl Uric acid 5.4 ACE 18 PTH wnl Vit D 12.89   Imaging reviewed 05/15/21 MRI left knee IMPRESSION: 1. High-grade partial-thickness cartilage loss of the medial femorotibial compartment with areas of full-thickness cartilage loss. 2. Edema in  superolateral Hoffa's fat as can be seen with patellar tendon-lateral femoral condyle friction syndrome. 3. Large joint effusion.    Activities of Daily Living:  Patient reports morning stiffness for all day. Patient Denies nocturnal pain.  Difficulty dressing/grooming: Denies Difficulty climbing stairs: Denies Difficulty getting out of chair: Denies Difficulty using hands for taps, buttons, cutlery, and/or writing: Reports  Review of Systems  Constitutional:  Positive for fatigue.  HENT:  Negative for mouth sores, mouth dryness and nose dryness.   Eyes:  Negative for pain, itching and dryness.  Respiratory:  Negative for shortness of breath and difficulty breathing.   Cardiovascular:  Negative for chest pain and palpitations.  Gastrointestinal:  Negative for blood in stool, constipation and diarrhea.  Endocrine: Negative for increased urination.  Genitourinary:  Negative for difficulty urinating.  Musculoskeletal:  Positive for joint pain, joint pain, joint swelling and morning stiffness. Negative for myalgias, muscle tenderness and myalgias.  Skin:  Negative for color change, rash and redness.  Allergic/Immunologic: Negative for susceptible to infections.  Neurological:  Negative for dizziness, numbness, headaches, memory loss and weakness.  Hematological:  Negative for bruising/bleeding tendency.  Psychiatric/Behavioral:  Negative for confusion.    PMFS History:  Patient Active Problem List   Diagnosis Date Noted   Pain of right thumb 07/17/2021   Vitamin D deficiency 06/08/2021   Positive ANA (antinuclear antibody) 06/08/2021   Effusion of right knee 05/05/2021   Dysmenorrhea 02/27/2021   History of anemia  02/27/2021   Effusion of left knee 01/22/2021   Preventative health care 12/08/2017   Acute bronchitis 06/10/2015   Tinea versicolor 09/17/2013   Lipoma of back 09/17/2013   VAGINAL PRURITUS 05/15/2010   HEMATURIA, HX OF 05/15/2010   MOLE 01/23/2009   OTHER  SPECIFIED DISORDER OF SKIN 12/24/2008   BACTERIAL VAGINITIS 07/20/2007   URINALYSIS, ABNORMAL 07/20/2007    History reviewed. No pertinent past medical history.  Family History  Problem Relation Age of Onset   Hyperlipidemia Mother    Heart attack Mother    Coronary artery disease Father    Hyperlipidemia Father    Diabetes Father    Hypertension Father    Diabetes Sister        type I---half sister   Alcoholism Brother    Breast cancer Maternal Aunt    Cancer Maternal Aunt        breast ca   Heart attack Maternal Aunt    Cancer Maternal Aunt        fallopian tubes   Cancer Maternal Uncle        tumor kidney, colon cancer   Cancer Paternal Aunt         breast   Breast cancer Paternal Aunt    Diabetes Maternal Grandmother    Heart disease Maternal Grandmother        CHF   Multiple sclerosis Cousin    Past Surgical History:  Procedure Laterality Date   WISDOM TOOTH EXTRACTION     Social History   Social History Narrative   Exercise --- no   Immunization History  Administered Date(s) Administered   Influenza Split 10/27/2020   Influenza,inj,Quad PF,6+ Mos 09/17/2013, 10/09/2019   Influenza-Unspecified 10/13/2015, 10/22/2016, 10/27/2017   PFIZER(Purple Top)SARS-COV-2 Vaccination 01/18/2020, 02/08/2020, 10/07/2020   Td 07/06/2010   Tdap 02/27/2021     Objective: Vital Signs: BP 116/74 (BP Location: Right Arm, Patient Position: Sitting, Cuff Size: Normal)   Pulse 78   Ht '5\' 8"'  (1.727 m)   Wt 153 lb (69.4 kg)   BMI 23.26 kg/m    Physical Exam HENT:     Right Ear: External ear normal.     Left Ear: External ear normal.     Mouth/Throat:     Mouth: Mucous membranes are moist.     Pharynx: Oropharynx is clear.  Eyes:     Conjunctiva/sclera: Conjunctivae normal.  Cardiovascular:     Rate and Rhythm: Normal rate and regular rhythm.  Pulmonary:     Effort: Pulmonary effort is normal.     Breath sounds: Normal breath sounds.  Skin:    General: Skin is warm  and dry.     Findings: No rash.  Neurological:     General: No focal deficit present.     Mental Status: She is alert.     Deep Tendon Reflexes: Reflexes normal.  Psychiatric:        Mood and Affect: Mood normal.     Musculoskeletal Exam:  Neck full ROM no tenderness Shoulders full ROM no tenderness or swelling Elbows full ROM no tenderness or swelling Wrists full ROM no tenderness or swelling Fingers full ROM no tenderness or swelling No paraspinal tenderness to palpation over upper and lower back Hip normal internal and external rotation without pain, no tenderness to lateral hip palpation Knees small bilateral effusions and anterior soft tissue swelling, bilateral patellofemoral crepitus, good range of motion Ankles full ROM no tenderness or swelling  Investigation: No additional findings.  Imaging: XR  Hand 2 View Right  Result Date: 07/17/2021 X-ray right hand 2 views Radiocarpal joint space appears normal.  There is moderate degenerative arthritis of the first [facility] joint with slight subluxation and osteophyte formation.  Probable hyperextensibility of the first IP joint.  MCP PIP and DIP joint spaces appear well-preserved throughout digits.  No erosions or abnormal bone mineralization are seen. 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   Recent Labs: Lab Results  Component Value Date   WBC 4.6 05/05/2021   HGB 13.0 05/05/2021   PLT 399.0 05/05/2021   NA 135 05/05/2021   K 3.6 05/05/2021   CL 104 05/05/2021   CO2 23 05/05/2021   GLUCOSE 96 05/05/2021   BUN 14 05/05/2021   CREATININE 0.86 05/05/2021   BILITOT 0.4 05/05/2021   ALKPHOS 71 05/05/2021   AST 14 05/05/2021   ALT 13 05/05/2021   PROT 7.4 05/05/2021   ALBUMIN 4.2 05/05/2021   CALCIUM 9.4 05/05/2021   CALCIUM 9.7 05/05/2021   GFRAA 107 01/23/2009    Speciality Comments: No specialty comments available.  Procedures:  No procedures performed Allergies: Patient  has no known allergies.   Assessment / Plan:     Visit Diagnoses: Positive ANA (antinuclear antibody) - Plan: Sedimentation rate, Anti-Smith antibody, Sjogrens syndrome-A extractable nuclear antibody, RNP Antibody  Positive ANA without specific systemic symptoms on exam or review today.  We will check additional specific antibody testing including Smith, SSA, RNP antibodies with low pretest suspicion at this time.  Also checking sedimentation rate to see if this remains elevated compared to May labs.  Effusion of right knee - Plan: Sedimentation rate Effusion of left knee - Plan: Sedimentation rate  Bilateral knee effusions there is not much fluid accumulation on exam today more looks like anterior fat pad or subcutaneous tissue swelling.  Review of MRI does demonstrate structural changes could also explain a lot of the joint symptoms.  Rechecking sedimentation rate as above.  Pain of right thumb - Plan: XR Hand 2 View Right, Sedimentation rate  Right thumb pain mostly localized around the MCP joint hand x-ray for evaluation today.  Currently she has a moderate amount of first [facility] joint osteoarthritis and some compensatory hyperextension of the thumb joints no erosive disease evidence.  Orders: Orders Placed This Encounter  Procedures   XR Hand 2 View Right   Sedimentation rate   Anti-Smith antibody   Sjogrens syndrome-A extractable nuclear antibody   RNP Antibody    No orders of the defined types were placed in this encounter.    Follow-Up Instructions: No follow-ups on file.   Collier Salina, MD  Note - This record has been created using [employer].  Chart creation errors have been sought, but may not always  have been located. Such creation errors do not reflect on  the standard of medical care.

## 2021-07-17 NOTE — Patient Instructions (Addendum)
Erythrocyte Sedimentation Rate Test Why am I having this test? The erythrocyte sedimentation rate (ESR) test is used to help find illnesses related to: Sudden (acute) or long-term (chronic) infections. Inflammation. The body's disease-fighting system attacking healthy cells (autoimmune diseases). Cancer. Tissue death. If you have symptoms that may be related to any of these illnesses, your health care provider may do an ESR test before doing more specific tests. If you have an inflammatory immune disease, such as rheumatoid arthritis, you may have thistest to help monitor your therapy. What is being tested? This test measures how long it takes for your red blood cells (erythrocytes) to settle in a solution over a certain amount of time (sedimentation rate). When you have an infection or inflammation, your red blood cells clump together and settle faster. The sedimentation rate provides information abouthow much inflammation is present in the body. What kind of sample is taken?  A blood sample is required for this test. It is usually collected by insertinga needle into a blood vessel. How do I prepare for this test? Follow any instructions from your health care provider about changing orstopping your regular medicines. Tell a health care provider about: Any allergies you have. All medicines you are taking, including vitamins, herbs, eye drops, creams, and over-the-counter medicines. Any blood disorders you have. Any surgeries you have had. Any medical conditions you have, such as thyroid or kidney disease. Whether you are pregnant or may be pregnant. How are the results reported? Your results will be reported as a value that measures sedimentation rate in millimeters per hour (mm/hr). Your health care provider will compare your results to normal ranges that were established after testing a large group of people (reference values). Reference values may vary among labs and hospitals. For this  test, common reference values, which vary by age and gender, are: Newborn: 0-2 mm/hr. Child, up to puberty: 0-10 mm/hr. Female: Under 50 years: 0-20 mm/hr. 50-85 years: 0-30 mm/hr. Over 85 years: 0-42 mm/hr. Female: Under 50 years: 0-15 mm/hr. 50-85 years: 0-20 mm/hr. Over 85 years: 0-30 mm/hr. Certain conditions or medicines may cause ESR levels to be falsely lower or higher, such as: Pregnancy. Obesity. Steroids, birth control pills, and blood thinners. Thyroid or kidney disease. What do the results mean? Results that are within reference values are considered normal, meaning that the level of inflammation in your body is healthy. High ESR levels mean that there is inflammation in your body. You will have more tests to help make adiagnosis. Inflammation may result from many different conditions or injuries. Talk with your health care provider about what your results mean. Questions to ask your health care provider Ask your health care provider, or the department that is doing the test: When will my results be ready? How will I get my results? What are my treatment options? What other tests do I need? What are my next steps? Summary The erythrocyte sedimentation rate (ESR) test is used to help find illnesses associated with sudden (acute) or long-term (chronic) infections, inflammation, autoimmune diseases, cancer, or tissue death. If you have symptoms that may be related to any of these illnesses, your health care provider may do an ESR test before doing more specific tests. If you have an inflammatory immune disease, such as rheumatoid arthritis, you may have this test to help monitor your therapy. This test measures how long it takes for your red blood cells (erythrocytes) to settle in a solution over a certain amount of time (sedimentation rate). This  provides information about how much inflammation is present in the body. This information is not intended to replace advice given to  you by your health care provider. Make sure you discuss any questions you have with your healthcare provider. Document Revised: 08/15/2020 Document Reviewed: 08/15/2020 Elsevier Patient Education  [place].   Antinuclear Antibody Test Why am I having this test? This is a test that is used to help diagnose systemic lupus erythematosus (SLE) and other autoimmune diseases. An autoimmune disease is a disease in which the body's own defense (immune)system attacks its organs. What is being tested? This test checks for antinuclear antibodies (ANA) in the blood. The presence of ANA is associated with several autoimmune diseases. It is seen in almost allpatients with lupus. What kind of sample is taken?  A blood sample is required for this test. It is usually collected by insertinga needle into a blood vessel. How are the results reported? Your test results will be reported as either positive or negative. A false-positive result can occur. A false positive is incorrect because itmeans that a condition is present when it is not. What do the results mean? A positive test result may mean that you have: Lupus. Other autoimmune diseases, such as rheumatoid arthritis, scleroderma, or Sjgren syndrome. Conditions that may cause a false-positive result include: Liver dysfunction. Myasthenia gravis. Infectious mononucleosis. Talk with your health care provider about what your results mean. Questions to ask your health care provider Ask your health care provider, or the department that is doing the test: When will my results be ready? How will I get my results? What are my treatment options? What other tests do I need? What are my next steps? Summary This is a test that is used to help diagnose systemic lupus erythematosus (SLE) and other autoimmune diseases. An autoimmune disease is a disease in which the body's own defense (immune)system attacks the body. This test checks for antinuclear  antibodies (ANA) in the blood. The presence of ANA is associated with several autoimmune diseases. It is seen in almost all patients with lupus. Your test results will be reported as either positive or negative. Talk with your health care provider about what your results mean. This information is not intended to replace advice given to you by your health care provider. Make sure you discuss any questions you have with your healthcare provider. Document Revised: 08/15/2020 Document Reviewed: 08/15/2020 Elsevier Patient Education  [place].

## 2021-07-20 LAB — SJOGRENS SYNDROME-A EXTRACTABLE NUCLEAR ANTIBODY: SSA (Ro) (ENA) Antibody, IgG: <1 AI

## 2021-07-20 LAB — SEDIMENTATION RATE: Sed Rate: 14 mm/h (ref 0–20)

## 2021-07-20 LAB — RNP ANTIBODY: Ribonucleic Protein(ENA) Antibody, IgG: <1 AI

## 2021-07-20 LAB — ANTI-SMITH ANTIBODY: ENA SM Ab Ser-aCnc: <1 AI

## 2021-07-20 NOTE — Progress Notes (Signed)
[place] Phone: [phone] Subjective:   [name], am serving as a scribe for Dr. Hulan Saas.  This visit occurred during the SARS-CoV-2 public health emergency.  Safety protocols were in place, including screening questions prior to the visit, additional usage of staff PPE, and extensive cleaning of exam room while observing appropriate contact time as indicated for disinfecting solutions.   I'm seeing this patient by the request  of:  [name], DO  CC: Recurrent knee pain  [id number]  06/08/2021 Patient did have reaccumulation of the effusion of the left knee.  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  Aspiration done again today to see if this would help.  Unfortunately concerned that there is potentially autoimmune activity contributing as well.  Given doxycycline secondary to the darkness of the effusion noted today.  Seem to be a little bit different than patient's baseline previously.  Unfortunately was unable to send to lab for further evaluation.  Patient will follow up with me in 6 weeks.  Would be a candidate for viscosupplementation and we will try to get approval.  Update 07/21/2021 Jeanne Davis is a 45 y.o. female coming in with complaint of B knee pain. Patient states that she did have some relief after aspiration last visit in L knee. Patient feels like R knee is more swollen than left and is giving her some more pain.  Patient's MRI showed that there is some high-grade cartilage loss of the patellofemoral joint.  Also having L CMC joint pain. 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  Patient states that it hurts from time to time but not as severe as her knees.  No past medical history on file. Past Surgical History:  Procedure  Laterality Date   WISDOM TOOTH EXTRACTION     Social History   Socioeconomic History   Marital status: Single    Spouse name: Not on file   Number of children: Not on file   Years of education: Not on file   Highest education level: Not on file  Occupational History   Occupation: dept [employer]  Tobacco Use   Smoking status: Never   Smokeless tobacco: Never  Vaping Use   Vaping Use: Never used  Substance and Sexual Activity   Alcohol use: No   Drug use: No   Sexual activity: Yes    Partners: Male    Birth control/protection: None  Other Topics Concern   Not on file  Social History Narrative   Exercise --- no   Social Determinants of Health   Financial Resource Strain: Not on file  Food Insecurity: Not on file  Transportation Needs: Not on file  Physical Activity: Not on file  Stress: Not on file  Social Connections: Not on file   No Known Allergies Family History  Problem Relation Age of Onset   Hyperlipidemia Mother    Heart attack Mother    Coronary artery disease Father    Hyperlipidemia Father    Diabetes Father    Hypertension Father    Diabetes Sister        type I---half sister   Alcoholism Brother    Breast cancer Maternal Aunt    Cancer Maternal Aunt        breast ca   Heart attack Maternal Aunt  Cancer Maternal Aunt        fallopian tubes   Cancer Maternal Uncle        tumor kidney, colon cancer   Cancer Paternal Aunt         breast   Breast cancer Paternal Aunt    Diabetes Maternal Grandmother    Heart disease Maternal Grandmother        CHF   Multiple sclerosis Cousin     Current Outpatient Medications (Endocrine & Metabolic):    Norethindrone Acetate-Ethinyl Estradiol (LOESTRIN 1.5/30, 21,) 1.5-30 MG-MCG tablet, Take 1 tablet by mouth daily.   Current Outpatient Medications (Respiratory):    fluticasone (FLONASE) 50 MCG/ACT nasal spray, Place 2 sprays into both nostrils daily.    Current Outpatient Medications  (Other):    doxycycline (VIBRA-TABS) 100 MG tablet, Take 1 tablet (100 mg total) by mouth 2 (two) times daily.   Lactic Ac-Citric Ac-Pot Bitart (PHEXXI) 1.8-1-0.4 % GEL, Place 1 application vaginally daily. May use 2-3 times per week.   valACYclovir (VALTREX) 1000 MG tablet, Take 1 tablet (1,000 mg total) by mouth daily.   Vitamin D, Ergocalciferol, (DRISDOL) 1.25 MG (50000 UNIT) CAPS capsule, Take 1 capsule (50,000 Units total) by mouth every 7 (seven) days.   Reviewed prior external information including notes and imaging from  primary care provider As well as notes that were available from care everywhere and other healthcare systems.  Past medical history, social, surgical and family history all reviewed in electronic medical record.  No pertanent information unless stated regarding to the chief complaint.   Review of Systems:  No headache, visual changes, nausea, vomiting, diarrhea, constipation, dizziness, abdominal pain, skin rash, fevers, chills, night sweats, weight loss, swollen lymph nodes, body aches, joint swelling, chest pain, shortness of breath, mood changes. POSITIVE muscle aches  Objective  Blood pressure 110/72, pulse 81, height '5\' 8"'$  (1.727 m), weight 142 lb (64.4 kg), SpO2 99 %.   General: No apparent distress alert and oriented x3 mood and affect normal, dressed appropriately.  HEENT: Pupils equal, extraocular movements intact  Respiratory: Patient's speak in full sentences and does not appear short of breath  Cardiovascular: No lower extremity edema, non tender, no erythema  Gait normal with good balance and coordination.  MSK: Right knee exam shows the patient does have an effusion noted.  Patient does have some limited range of motion in all planes.  Patient is some mild crepitus noted. Left knee has trace effusion noted.  Patient still has tenderness over the patellofemoral joint.  Lateral tracking noted.  After informed written and verbal consent, patient was  seated on exam table. Left knee was prepped with alcohol swab and utilizing anterolateral approach, patient's left knee space was injected with60 mg/2.5 mL of Durolane(sodium hyaluronate) in a prefilled syringe was injected easily into the knee through a 22-gauge needle..Patient tolerated the procedure well without immediate complications.  Procedure: Real-time Ultrasound Guided Injection of right knee Device: GE Logiq Q7 Ultrasound guided injection is preferred based studies that show increased duration, increased effect, greater accuracy, decreased procedural pain, increased response rate, and decreased cost with ultrasound guided versus blind injection.  Verbal informed consent obtained.  Time-out conducted.  Noted no overlying erythema, induration, or other signs of local infection.  Skin prepped in a sterile fashion.  Local anesthesia: Topical Ethyl chloride.  With sterile technique and under real time ultrasound guidance: With a 22-gauge 2 inch needle patient was injected with 4 cc of 0.5% Marcaine and  aspirated 45  cc of straw-colored fluid then injected 1 cc of Kenalog 40 mg/dL. This was from a superior lateral approach.  Completed without difficulty  Pain immediately resolved suggesting accurate placement of the medication.  Advised to call if fevers/chills, erythema, induration, drainage, or persistent bleeding.  Impression: Technically successful ultrasound guided injection.    Impression and Recommendations:    The above documentation has been reviewed and is accurate and complete Lyndal Pulley, DO

## 2021-07-21 ENCOUNTER — Ambulatory Visit (INDEPENDENT_AMBULATORY_CARE_PROVIDER_SITE_OTHER): Payer: 59 | Admitting: Family Medicine

## 2021-07-21 ENCOUNTER — Other Ambulatory Visit: Payer: Self-pay

## 2021-07-21 ENCOUNTER — Encounter: Payer: Self-pay | Admitting: Family Medicine

## 2021-07-21 ENCOUNTER — Ambulatory Visit: Payer: Self-pay

## 2021-07-21 VITALS — BP 110/72 | HR 81 | Ht 68.0 in | Wt 142.0 lb

## 2021-07-21 DIAGNOSIS — M25562 Pain in left knee: Secondary | ICD-10-CM | POA: Diagnosis not present

## 2021-07-21 DIAGNOSIS — M25461 Effusion, right knee: Secondary | ICD-10-CM

## 2021-07-21 DIAGNOSIS — G8929 Other chronic pain: Secondary | ICD-10-CM

## 2021-07-21 DIAGNOSIS — R7689 Other specified abnormal immunological findings in serum: Secondary | ICD-10-CM

## 2021-07-21 DIAGNOSIS — R768 Other specified abnormal immunological findings in serum: Secondary | ICD-10-CM | POA: Diagnosis not present

## 2021-07-21 DIAGNOSIS — M1712 Unilateral primary osteoarthritis, left knee: Secondary | ICD-10-CM | POA: Diagnosis not present

## 2021-07-21 NOTE — Assessment & Plan Note (Signed)
Patient given viscosupplementation today.  Responded well currently.  Hopefully that this will make some difference.  Discussed icing regimen and home exercises.  Increase activity slowly.

## 2021-07-21 NOTE — Assessment & Plan Note (Signed)
Patient had further work-up and management ANA is negative.  Patient's sedimentation rate has come down as well.

## 2021-07-21 NOTE — Assessment & Plan Note (Signed)
Significant aspiration done again today.  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  We will get viscosupplementation approved for the right side as well.  Patient be followed up again in 5 to 6 weeks.  Chronic problem with exacerbation

## 2021-07-21 NOTE — Patient Instructions (Signed)
See me again in 6 weeks 

## 2021-08-28 NOTE — Progress Notes (Signed)
[place] Phone: [phone] Subjective:   [name], am serving as a scribe for Dr. Hulan Saas. This visit occurred during the SARS-CoV-2 public health emergency.  Safety protocols were in place, including screening questions prior to the visit, additional usage of staff PPE, and extensive cleaning of exam room while observing appropriate contact time as indicated for disinfecting solutions.   I'm seeing this patient by the request  of:  [name], DO  CC: Right knee pain  [id number]  07/21/2021 Significant aspiration done again today.  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  We will get viscosupplementation approved for the right side as well.  Patient be followed up again in 5 to 6 weeks.  Chronic problem with exacerbation  Patient given viscosupplementation today.  Responded well currently.  Hopefully that this will make some difference.  Discussed icing regimen and home exercises.  Increase activity slowly  Update 09/01/2021 Jeanne Davis is a 45 y.o. female coming in with complaint of B knee pain. Visco given in L knee last visit. Does feel like gel helped to alleviate her L knee pain. Notes swelling R knee for past month. Pain is present in anterior aspect.  Patient states that the right knee seems to be much worse.  Has not noticed any increase in swelling in the left knee since the viscosupplementation.      No past medical history on file. Past Surgical History:  Procedure Laterality Date   WISDOM TOOTH EXTRACTION     Social History   Socioeconomic History   Marital status: Single    Spouse name: Not on file   Number of children: Not on file   Years of education: Not on file   Highest education level: Not on file  Occupational History   Occupation: dept [employer]  Tobacco Use   Smoking status: Never    Smokeless tobacco: Never  Vaping Use   Vaping Use: Never used  Substance and Sexual Activity   Alcohol use: No   Drug use: No   Sexual activity: Yes    Partners: Male    Birth control/protection: None  Other Topics Concern   Not on file  Social History Narrative   Exercise --- no   Social Determinants of Health   Financial Resource Strain: Not on file  Food Insecurity: Not on file  Transportation Needs: Not on file  Physical Activity: Not on file  Stress: Not on file  Social Connections: Not on file   No Known Allergies Family History  Problem Relation Age of Onset   Hyperlipidemia Mother    Heart attack Mother    Coronary artery disease Father    Hyperlipidemia Father    Diabetes Father    Hypertension Father    Diabetes Sister        type I---half sister   Alcoholism Brother    Breast cancer Maternal Aunt    Cancer Maternal Aunt        breast ca   Heart attack Maternal Aunt    Cancer Maternal Aunt        fallopian tubes   Cancer Maternal Uncle        tumor kidney, colon cancer   Cancer Paternal Aunt         breast   Breast cancer Paternal Aunt    Diabetes Maternal Grandmother  Heart disease Maternal Grandmother        CHF   Multiple sclerosis Cousin     Current Outpatient Medications (Endocrine & Metabolic):    Norethindrone Acetate-Ethinyl Estradiol (LOESTRIN 1.5/30, 21,) 1.5-30 MG-MCG tablet, Take 1 tablet by mouth daily.   Current Outpatient Medications (Respiratory):    fluticasone (FLONASE) 50 MCG/ACT nasal spray, Place 2 sprays into both nostrils daily.    Current Outpatient Medications (Other):    doxycycline (VIBRA-TABS) 100 MG tablet, Take 1 tablet (100 mg total) by mouth 2 (two) times daily.   Lactic Ac-Citric Ac-Pot Bitart (PHEXXI) 1.8-1-0.4 % GEL, Place 1 application vaginally daily. May use 2-3 times per week.   valACYclovir (VALTREX) 1000 MG tablet, Take 1 tablet (1,000 mg total) by mouth daily.   Vitamin D, Ergocalciferol, (DRISDOL)  1.25 MG (50000 UNIT) CAPS capsule, Take 1 capsule (50,000 Units total) by mouth every 7 (seven) days.   Reviewed prior external information including notes and imaging from  primary care provider As well as notes that were available from care everywhere and other healthcare systems.  Past medical history, social, surgical and family history all reviewed in electronic medical record.  No pertanent information unless stated regarding to the chief complaint.   Review of Systems:  No headache, visual changes, nausea, vomiting, diarrhea, constipation, dizziness, abdominal pain, skin rash, fevers, chills, night sweats, weight loss, swollen lymph nodes, body aches, joint swelling, chest pain, shortness of breath, mood changes. POSITIVE muscle aches  Objective  Blood pressure 122/82, pulse 83, height '5\' 8"'$  (1.727 m), weight 142 lb (64.4 kg), SpO2 99 %.   General: No apparent distress alert and oriented x3 mood and affect normal, dressed appropriately.  HEENT: Pupils equal, extraocular movements intact  Respiratory: Patient's speak in full sentences and does not appear short of breath  Cardiovascular: No lower extremity edema, non tender, no erythema  Gait normal with good balance and coordination.  MSK: Hypermobility noted. Patient's right knee does show that patient does have some lateral tracking noted.  Large effusion noted.  Patient does have crepitus noted.  No significant instability of the knees are noted.  Procedure: Real-time Ultrasound Guided Injection of right knee Device: GE Logiq Q7 Ultrasound guided injection is preferred based studies that show increased duration, increased effect, greater accuracy, decreased procedural pain, increased response rate, and decreased cost with ultrasound guided versus blind injection.  Verbal informed consent obtained.  Time-out conducted.  Noted no overlying erythema, induration, or other signs of local infection.  Skin prepped in a sterile fashion.   Local anesthesia: Topical Ethyl chloride.  With sterile technique and under real time ultrasound guidance: With a 22-gauge 2 inch needle patient was injected with 4 cc of 0.5% Marcaine and aspirated 60 cc of straw-colored fluid and then injected 3 cc of 60 mg of Durolane  This was from a superior lateral approach.  Completed without difficulty  Pain immediately resolved suggesting accurate placement of the medication.  Advised to call if fevers/chills, erythema, induration, drainage, or persistent bleeding.   Impression: Technically successful ultrasound guided injection.    Impression and Recommendations:     The above documentation has been reviewed and is accurate and complete Lyndal Pulley, DO

## 2021-09-01 ENCOUNTER — Other Ambulatory Visit: Payer: Self-pay

## 2021-09-01 ENCOUNTER — Ambulatory Visit: Payer: Self-pay

## 2021-09-01 ENCOUNTER — Ambulatory Visit: Payer: 59 | Admitting: Family Medicine

## 2021-09-01 ENCOUNTER — Encounter: Payer: Self-pay | Admitting: Family Medicine

## 2021-09-01 VITALS — BP 122/82 | HR 83 | Ht 68.0 in | Wt 142.0 lb

## 2021-09-01 DIAGNOSIS — M25562 Pain in left knee: Secondary | ICD-10-CM

## 2021-09-01 DIAGNOSIS — M25561 Pain in right knee: Secondary | ICD-10-CM

## 2021-09-01 DIAGNOSIS — R768 Other specified abnormal immunological findings in serum: Secondary | ICD-10-CM

## 2021-09-01 DIAGNOSIS — M1711 Unilateral primary osteoarthritis, right knee: Secondary | ICD-10-CM | POA: Diagnosis not present

## 2021-09-01 DIAGNOSIS — G8929 Other chronic pain: Secondary | ICD-10-CM

## 2021-09-01 DIAGNOSIS — M1712 Unilateral primary osteoarthritis, left knee: Secondary | ICD-10-CM

## 2021-09-01 NOTE — Assessment & Plan Note (Signed)
Patient given injection today and tolerated the procedure well.  Discussed icing regimen and home exercises.  Increase activity slowly.  Follow-up again in 6 to 8 weeks.

## 2021-09-01 NOTE — Patient Instructions (Signed)
See me again in 6 weeks 

## 2021-09-01 NOTE — Assessment & Plan Note (Signed)
Has seen rheumatology.

## 2021-09-01 NOTE — Assessment & Plan Note (Signed)
Patient has responded very well to the left knee.  Does likely have the underlying degenerative arthritis noted on the MRI.  Patient was doing better and that I do believe that patient does have the same on the contralateral side.  Did the viscosupplementation on the right knee today.  We will continue to monitor.  Follow-up with me again in 6 to 8 weeks.

## 2021-09-11 ENCOUNTER — Encounter: Payer: Self-pay | Admitting: Family Medicine

## 2021-10-12 NOTE — Progress Notes (Signed)
[name] [address] [place] Phone: [phone] Subjective:   [name], am serving as a scribe for Dr. Hulan Saas. This visit occurred during the SARS-CoV-2 public health emergency.  Safety protocols were in place, including screening questions prior to the visit, additional usage of staff PPE, and extensive cleaning of exam room while observing appropriate contact time as indicated for disinfecting solutions.   I'm seeing this patient by the request  of:  [name], DO  CC: Bilateral knee pain right greater than left  [id number]  09/01/2021 Patient given injection today and tolerated the procedure well.  Discussed icing regimen and home exercises.  Increase activity slowly.  Follow-up again in 6 to 8 weeks.  Patient has responded very well to the left knee.  Does likely have the underlying degenerative arthritis noted on the MRI.  Patient was doing better and that I do believe that patient does have the same on the contralateral side.  Did the viscosupplementation on the right knee today.  We will continue to monitor.  Follow-up with me again in 6 to 8 weeks.  Update 10/13/2021 Jeanne Davis is a 45 y.o. female coming in with complaint of B knee pain. R knee gel injection last visit. Patient states right knee still gets swollen and sore. No new characteristics. Left knee is doing ok. No new complaints patient states that the viscosupplementation on the right knee did not seem to help as much as it did on the left knee.  Had an increase in swelling almost immediately again.  Patient feels like it is fairly significant.       No past medical history on file. Past Surgical History:  Procedure Laterality Date   WISDOM TOOTH EXTRACTION     Social History   Socioeconomic History   Marital status: Single    Spouse name: Not on file   Number of children: Not on file   Years of education: Not on file   Highest  education level: Not on file  Occupational History   Occupation: dept [employer]  Tobacco Use   Smoking status: Never   Smokeless tobacco: Never  Vaping Use   Vaping Use: Never used  Substance and Sexual Activity   Alcohol use: No   Drug use: No   Sexual activity: Yes    Partners: Male    Birth control/protection: None  Other Topics Concern   Not on file  Social History Narrative   Exercise --- no   Social Determinants of Health   Financial Resource Strain: Not on file  Food Insecurity: Not on file  Transportation Needs: Not on file  Physical Activity: Not on file  Stress: Not on file  Social Connections: Not on file   No Known Allergies Family History  Problem Relation Age of Onset   Hyperlipidemia Mother    Heart attack Mother    Coronary artery disease Father    Hyperlipidemia Father    Diabetes Father    Hypertension Father    Diabetes Sister        type I---half sister   Alcoholism Brother    Breast cancer Maternal Aunt    Cancer Maternal Aunt        breast ca   Heart attack Maternal Aunt    Cancer Maternal Aunt        fallopian tubes   Cancer Maternal Uncle        tumor kidney,  colon cancer   Cancer Paternal Aunt         breast   Breast cancer Paternal Aunt    Diabetes Maternal Grandmother    Heart disease Maternal Grandmother        CHF   Multiple sclerosis Cousin     Current Outpatient Medications (Endocrine & Metabolic):    Norethindrone Acetate-Ethinyl Estradiol (LOESTRIN 1.5/30, 21,) 1.5-30 MG-MCG tablet, Take 1 tablet by mouth daily.   Current Outpatient Medications (Respiratory):    fluticasone (FLONASE) 50 MCG/ACT nasal spray, Place 2 sprays into both nostrils daily.    Current Outpatient Medications (Other):    doxycycline (VIBRA-TABS) 100 MG tablet, Take 1 tablet (100 mg total) by mouth 2 (two) times daily.   Lactic Ac-Citric Ac-Pot Bitart (PHEXXI) 1.8-1-0.4 % GEL, Place 1 application vaginally daily. May use 2-3 times per  week.   valACYclovir (VALTREX) 1000 MG tablet, Take 1 tablet (1,000 mg total) by mouth daily.   Vitamin D, Ergocalciferol, (DRISDOL) 1.25 MG (50000 UNIT) CAPS capsule, Take 1 capsule (50,000 Units total) by mouth every 7 (seven) days.   Reviewed prior external information including notes and imaging from  primary care provider As well as notes that were available from care everywhere and other healthcare systems.  Past medical history, social, surgical and family history all reviewed in electronic medical record.  No pertanent information unless stated regarding to the chief complaint.   Review of Systems:  No headache, visual changes, nausea, vomiting, diarrhea, constipation, dizziness, abdominal pain, skin rash, fevers, chills, night sweats, weight loss, swollen lymph nodes, body aches, joint swelling, chest pain, shortness of breath, mood changes. POSITIVE muscle aches  Objective  Blood pressure 104/72, pulse (!) 106, height 5\' 8"  (1.727 m), weight 145 lb (65.8 kg), SpO2 99 %.   General: No apparent distress alert and oriented x3 mood and affect normal, dressed appropriately.  HEENT: Pupils equal, extraocular movements intact  Respiratory: Patient's speak in full sentences and does not appear short of breath  Cardiovascular: No lower extremity edema, non tender, no erythema  Gait normal with good balance and coordination.  MSK:   Right knee exam does have significant effusion noted.  Lacking the last 10 degrees of flexion.  No significant stability.  Procedure: Real-time Ultrasound Guided Injection of right knee Device: GE Logiq Q7 Ultrasound guided injection is preferred based studies that show increased duration, increased effect, greater accuracy, decreased procedural pain, increased response rate, and decreased cost with ultrasound guided versus blind injection.  Verbal informed consent obtained.  Time-out conducted.  Noted no overlying erythema, induration, or other signs of  local infection.  Skin prepped in a sterile fashion.  Local anesthesia: Topical Ethyl chloride.  With sterile technique and under real time ultrasound guidance: With a 22-gauge 2 inch needle patient was injected with 4 cc of 0.5% Marcaine and aspirated 55 cc of tinged fluid with darker synovial fluid then injected 1 cc of Kenalog 40 mg/dL. This was from a superior lateral approach.  Completed without difficulty  Pain immediately improved suggesting accurate placement of the medication.  Advised to call if fevers/chills, erythema, induration, drainage, or persistent bleeding.  Impression: Technically successful ultrasound guided injection.   Impression and Recommendations:     The above documentation has been reviewed and is accurate and complete Lyndal Pulley, DO

## 2021-10-13 ENCOUNTER — Other Ambulatory Visit: Payer: Self-pay

## 2021-10-13 ENCOUNTER — Ambulatory Visit: Payer: 59 | Admitting: Family Medicine

## 2021-10-13 ENCOUNTER — Ambulatory Visit: Payer: Self-pay

## 2021-10-13 VITALS — BP 104/72 | HR 106 | Ht 68.0 in | Wt 145.0 lb

## 2021-10-13 DIAGNOSIS — M1711 Unilateral primary osteoarthritis, right knee: Secondary | ICD-10-CM

## 2021-10-13 DIAGNOSIS — M2550 Pain in unspecified joint: Secondary | ICD-10-CM | POA: Diagnosis not present

## 2021-10-13 MED ORDER — DOXYCYCLINE HYCLATE 100 MG PO TABS: 100.0000 mg | ORAL_TABLET | Freq: Two times a day (BID) | ORAL | 0 refills | Status: DC

## 2021-10-13 NOTE — Patient Instructions (Addendum)
[facility] [phone] Call Today  When we receive your results we will contact you.  Drained the knee today See you again in 6-8 weeks just in cas

## 2021-10-14 ENCOUNTER — Encounter: Payer: Self-pay | Admitting: Family Medicine

## 2021-10-14 LAB — CELL COUNT + DIFF, W/O CRYST-SYNVL FLD
Basophils, %: 0 %
Eosinophils-Synovial: 0 % (ref 0–2)
Lymphocytes-Synovial Fld: 63 % (ref 0–74)
Monocyte/Macrophage: 20 % (ref 0–69)
Neutrophil, Synovial: 17 % (ref 0–24)
Synoviocytes, %: 0 % (ref 0–15)
WBC, Synovial: 584 cells/uL — ABNORMAL HIGH (ref <150)

## 2021-10-14 LAB — SYNOVIAL FLUID, CRYSTAL

## 2021-10-14 NOTE — Assessment & Plan Note (Signed)
Chronic problem with exacerbation.  Did not respond extremely well to the viscosupplementation and had recurrent effusion immediately.  Including noticed the effusion seems to be more of a darker color and low-sodium for further evaluation.  I do feel at this point with patient continuing to have difficulty for further evaluation with advanced imaging and MRI of the knee would be beneficial.  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  Depending on findings we will discuss the possibility of conservative therapy versus surgical intervention.  Once again underlying positive ANA patient has had and has already seen rheumatology.

## 2021-11-01 ENCOUNTER — Other Ambulatory Visit: Payer: 59

## 2021-11-09 ENCOUNTER — Ambulatory Visit
Admission: RE | Admit: 2021-11-09 | Discharge: 2021-11-09 | Disposition: A | Discharge status: Home / Self Care | Payer: 59 | Source: Ambulatory Visit | Attending: Family Medicine | Admitting: Family Medicine

## 2021-11-09 ENCOUNTER — Other Ambulatory Visit: Payer: Self-pay

## 2021-11-09 DIAGNOSIS — M1711 Unilateral primary osteoarthritis, right knee: Secondary | ICD-10-CM

## 2021-11-09 IMAGING — MR MR KNEE*R* W/O CM
4 of 7 series · 19 of 40 positions shown · non-contrast
Comparison: X-ray knee [DATE].

CLINICAL DATA: Chronic right anterior knee pain and swelling for 1
year.

EXAM:
MRI OF THE RIGHT KNEE WITHOUT CONTRAST
TECHNIQUE: Multiplanar, multisequence MR imaging of the knee was performed. No
intravenous contrast was administered.

[Series 3: T2 fat-sat · axial · 4.0mm · 0.29mm/px · z∈[-63,+52]mm · 3 of 27 slices shown]
[im 1/27]
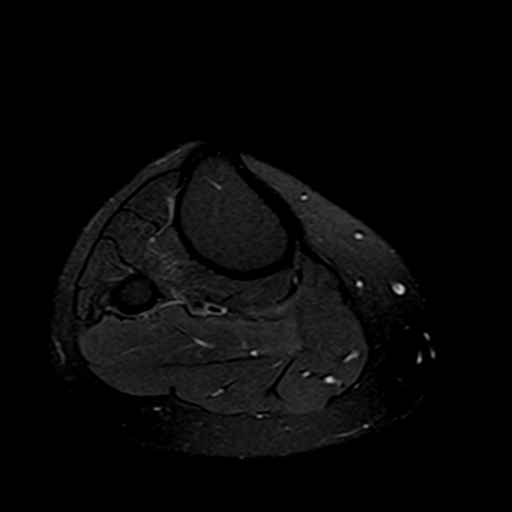
[im 14/27]
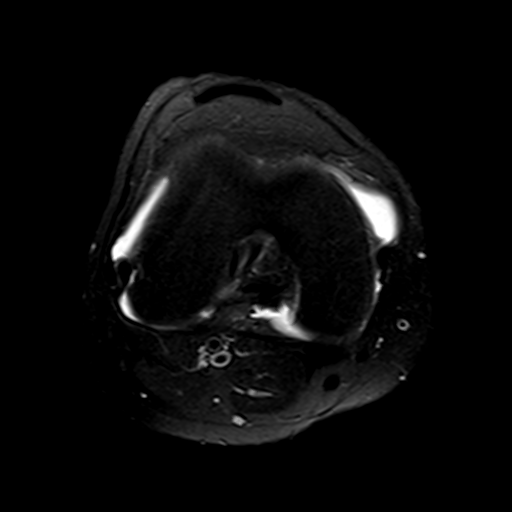
[im 27/27]
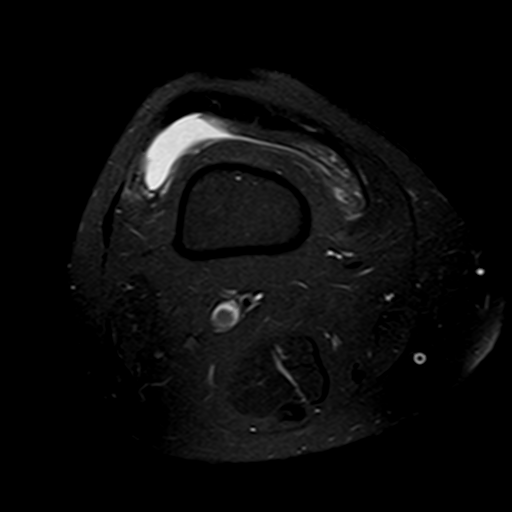

[Series 6: PD fat-sat · coronal · 3.0mm · 0.29mm/px · 7 of 28 slices shown (1 of 3)]
[im 1/28]
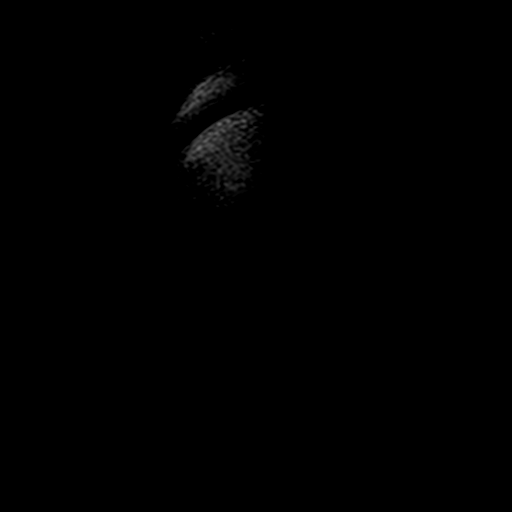
[im 5/28]
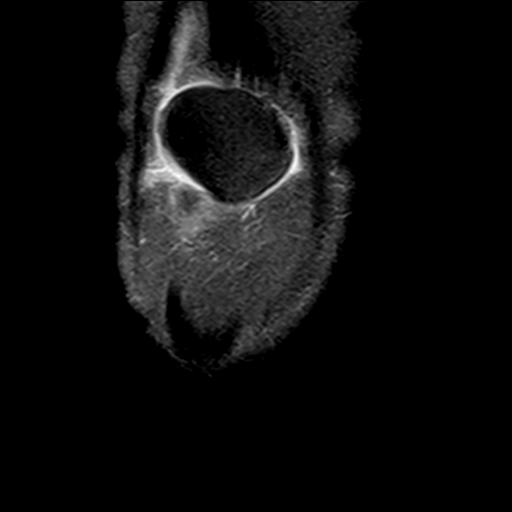
[im 10/28]
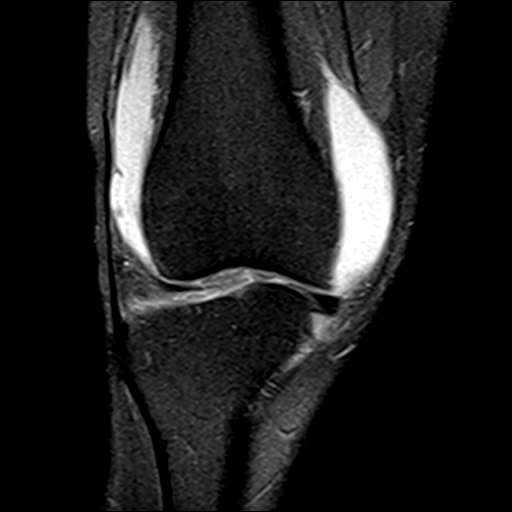
[im 14/28]
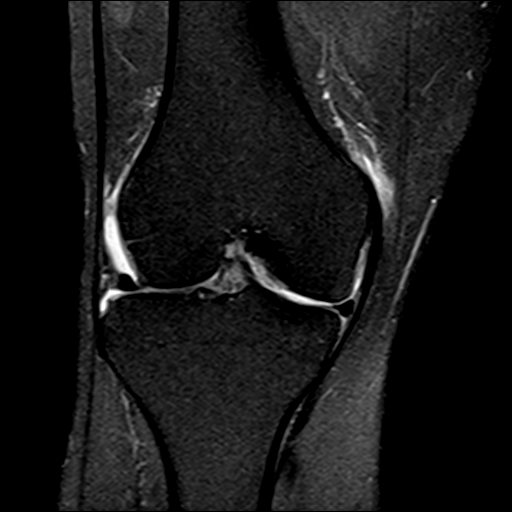
[im 19/28]
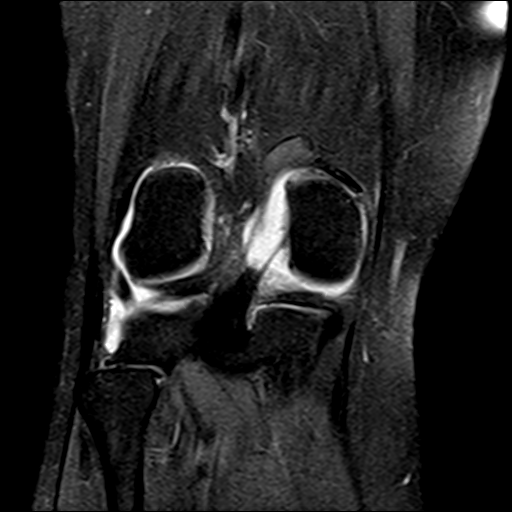
[im 23/28]
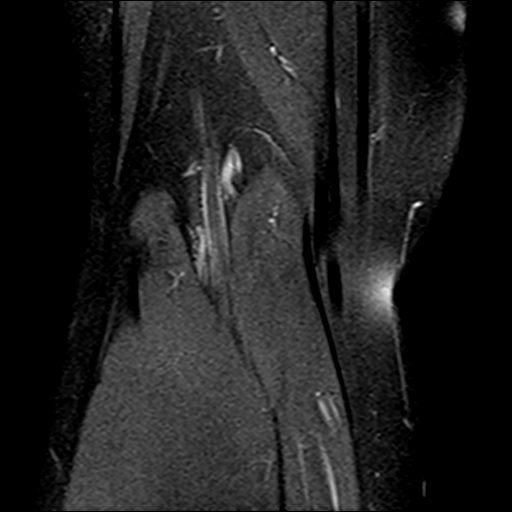
[im 28/28]
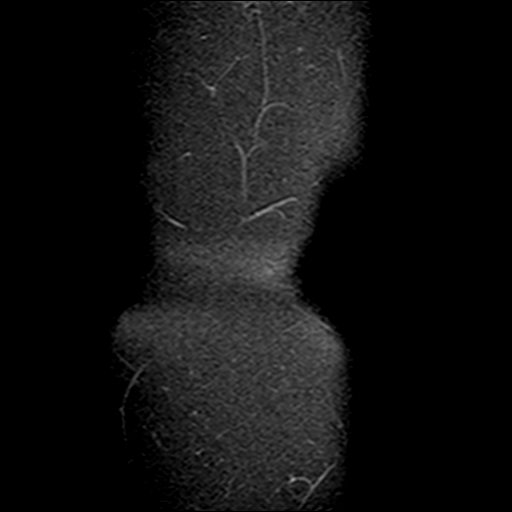

[Series 7: PD fat-sat · sagittal · 3.0mm · 0.29mm/px · 6 of 30 slices shown (2 of 3)]
[im 1/30]
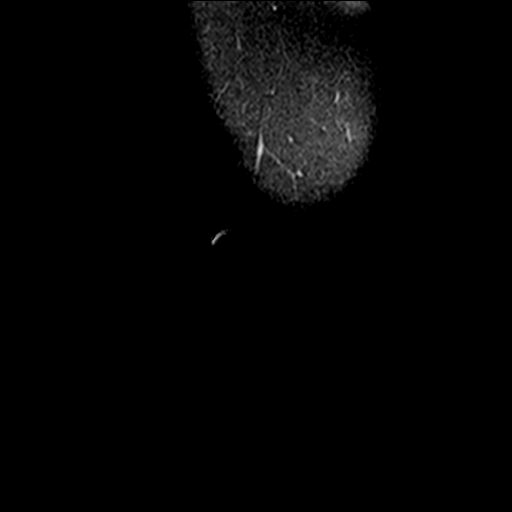
[im 5/30]
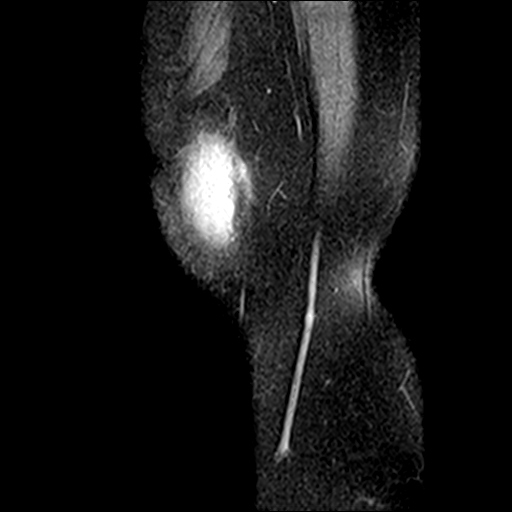
[im 10/30]
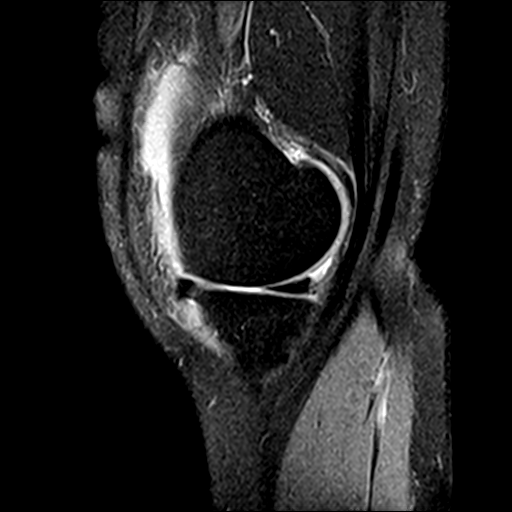
[im 15/30]
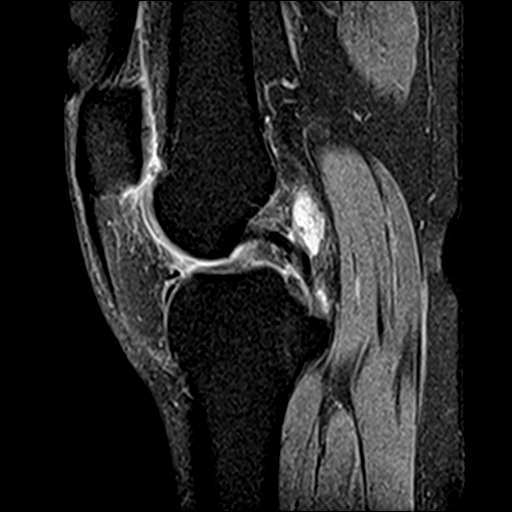
[im 20/30]
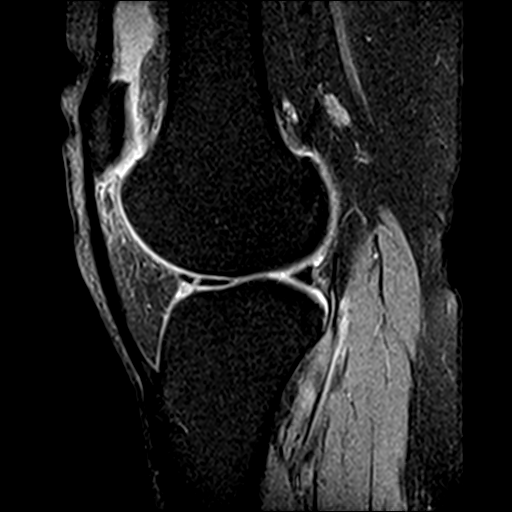
[im 25/30]
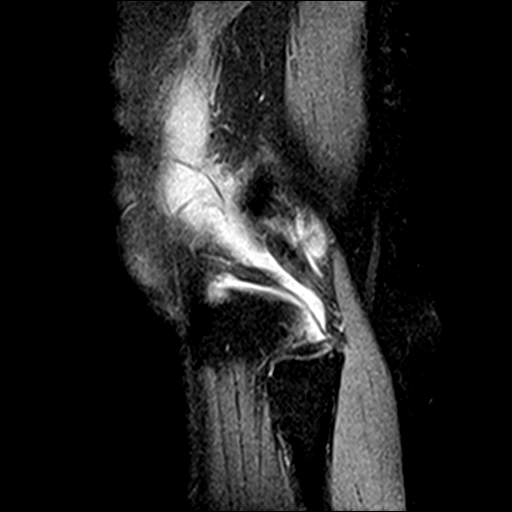

[Series 9: PD fat-sat · oblique · 2.0mm · 0.29mm/px · 3 of 11 slices shown (3 of 3)]
[im 1/11]
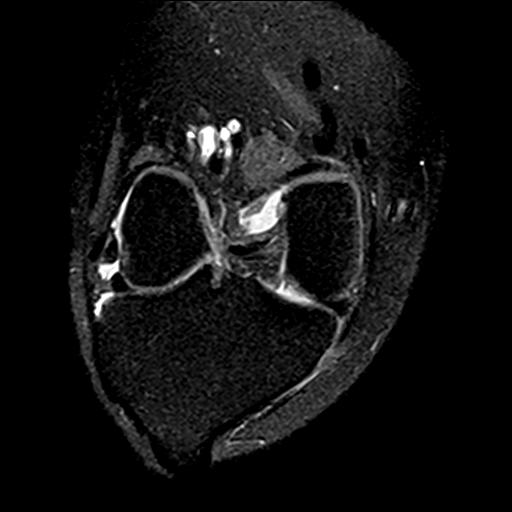
[im 6/11]
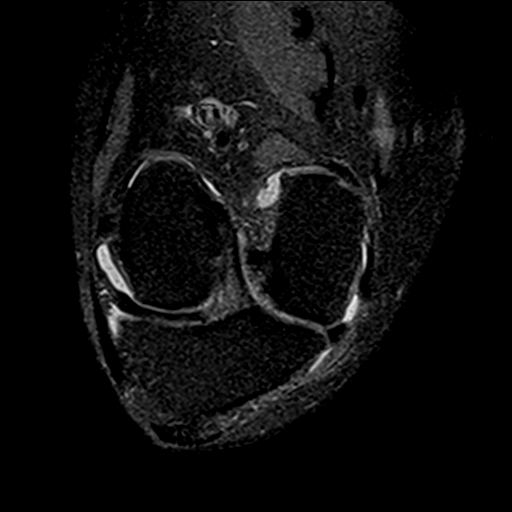
[im 11/11]
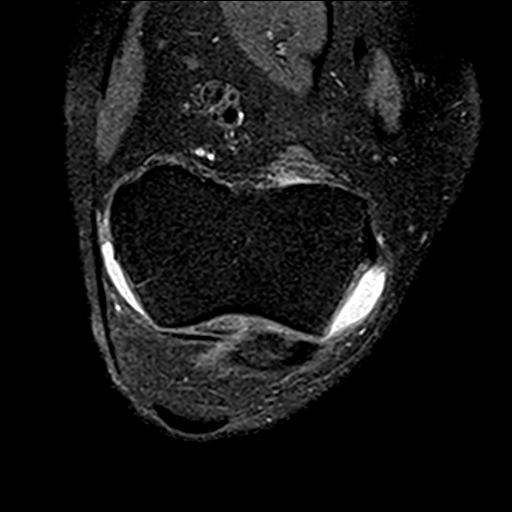

[19 of 40 positions shown; findings below may reference images not displayed]

FINDINGS: MENISCI

Medial: Intact.

Lateral: Intact.

LIGAMENTS

Cruciates: ACL and PCL are intact.

Collaterals: Medial collateral ligament is intact. Lateral
collateral ligament complex is intact.

CARTILAGE

Patellofemoral:  No chondral defect.

Medial: Full-thickness cartilage loss of the medial femorotibial
compartment.

Lateral: Partial-thickness cartilage loss of the lateral
femorotibial compartment.

JOINT: Large joint effusion. Minimal edema in the infrapatellar
Hoffa's fat. No plical thickening.

POPLITEAL FOSSA: Popliteus tendon is intact. No Baker's cyst.

EXTENSOR MECHANISM: Intact quadriceps tendon. Intact patellar
tendon. Intact lateral patellar retinaculum. Intact medial patellar
retinaculum. Intact MPFL.

BONES: No aggressive osseous lesion. No fracture or dislocation.

Other: No fluid collection or hematoma. Muscles are normal.
IMPRESSION: 1. No meniscal or ligamentous injury of the right knee.
2. Full-thickness cartilage loss of the medial femorotibial
compartment.
3. Partial-thickness cartilage loss of the lateral femorotibial
compartment.
4. Large joint effusion.

## 2021-11-16 ENCOUNTER — Telehealth: Payer: Self-pay

## 2021-11-16 NOTE — Telephone Encounter (Signed)
Patient evaluated/treated in ER.   [facility] Night - ClientTELEPHONE ADVICE RECORDAccessNurse [place] Chief Complaint Hives Reason for Call Symptomatic / Request for Health Information Initial Comment [name] has hives and requesting Prednisone. Translation No Nurse Assessment Nurse: Terence Lux, RN, Christine Date/Time (Eastern Time): 11/14/2021 6:12:37 PM Confirm and document reason for call. If symptomatic, describe symptoms. ---[name] has hives and requesting Prednisone. (allergic to pollen & trees) Does the patient have any new or worsening symptoms? ---Yes Will a triage be completed? ---Yes Related visit to physician within the last 2 weeks? ---No Does the PT have any chronic conditions? (i.e. diabetes, asthma, this includes High risk factors for pregnancy, etc.) ---Yes List chronic conditions. ---allergies to pollen;asthma Is the patient pregnant or possibly pregnant? (Ask all females between the ages of 60-55) ---No Is this a behavioral health or substance abuse call? ---No Guidelines Guideline Title Affirmed Question Affirmed Notes Nurse Date/Time (Eastern Time) Hives [1] Hoarseness or cough now AND [2] rapid onset Alpha [place] 11/14/2021 6:13:59 PM Disp. Time Eilene Ghazi Time) Disposition Final User 11/14/2021 6:23:29 PM Paged On Call back to [facility], RN, Altha Harm 11/14/2021 6:30:04 PM 911 Outcome Documentation Terence Lux, RN, Altha Harm Reason: pt refuses PLEASE NOTE: All timestamps contained within this report are represented as [place] Standard Time. CONFIDENTIALTY NOTICE: This fax transmission is intended only for the addressee. It contains information that is legally privileged, confidential or otherwise protected from use or disclosure. If you are not the intended recipient, you are strictly prohibited from reviewing, disclosing, copying using or disseminating any of this information or taking any  action in reliance on or regarding this information. If you have received this fax in error, please notify us immediately by telephone so that we can arrange for its return to [place]. Phone: [phone], Toll-Free: [phone], Fax: [phone] Page: 2 of 2 Call Id: [id number] 11/14/2021 6:15:53 PM Call EMS 911 Now Yes Terence Lux, RN, Lonna Duval Disagree/Comply Disagree [name] Understands Yes PreDisposition Call Doctor Care Advice Given Per Guideline CALL EMS 911 NOW: * Immediate medical attention is needed. You need to hang up and call 911 (or an ambulance). * Triager Discretion: I'll call you back in a few minutes to be sure you were able to reach them. CARE ADVICE given per Hives (Adult) guideline. Paging DoctorName Phone DateTime Result/ Outcome Message Type Notes Roma Schanz- MD [phone] 11/14/2021 6:23:29 PM Paged On Call Back to Call Center Doctor Paged Pls call [phone] Carey Bullocks, RN Roma Schanz- MD 11/14/2021 6:29:42 PM Spoke with On Call - General Message Result OCP states pt needs to be seen. She can either go to ED or UC and advises to take Benedryl. States she cannot call in Prednisone without her being seen

## 2021-11-30 NOTE — Progress Notes (Signed)
[facility] [address] [place] Phone: [phone] Subjective:   [name], am serving as a scribe for Dr. Hulan Saas. This visit occurred during the SARS-CoV-2 public health emergency.  Safety protocols were in place, including screening questions prior to the visit, additional usage of staff PPE, and extensive cleaning of exam room while observing appropriate contact time as indicated for disinfecting solutions.   I'm seeing this patient by the request  of:  [name], DO  CC: Knee pain following  [id number]  10/13/2021 Chronic problem with exacerbation.  Did not respond extremely well to the viscosupplementation and had recurrent effusion immediately.  Including noticed the effusion seems to be more of a darker color and low-sodium for further evaluation.  I do feel at this point with patient continuing to have difficulty for further evaluation with advanced imaging and MRI of the knee would be beneficial.  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  Depending on findings we will discuss the possibility of conservative therapy versus surgical intervention.  Once again underlying positive ANA patient has had and has already seen rheumatology.  Update 12/01/2021 SHARVI MOONEYHAN is a 45 y.o. female coming in with complaint of R knee pain. Patient states pain has decreased, but still sore. Has not been doing any type of interventions for pain.  Patient states now that no significant increase in instability.  If anything is still seems to be doing making improvement.  Feels like the gel injection has helped some just not as significantly as it helped on the contralateral side  MRI R knee 11/09/2021   IMPRESSION: 1. No meniscal or ligamentous injury  of the right knee. 2. 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 3. Partial-thickness cartilage loss of the lateral femorotibial compartment. 4. Large joint effusion.         No past medical history on file. Past Surgical History:  Procedure Laterality Date   WISDOM TOOTH EXTRACTION     Social History   Socioeconomic History   Marital status: Single    Spouse name: Not on file   Number of children: Not on file   Years of education: Not on file   Highest education level: Not on file  Occupational History   Occupation: de[employer]  Tobacco Use   Smoking status: Never   Smokeless tobacco: Never  Vaping Use   Vaping Use: Never used  Substance and Sexual Activity   Alcohol use: No   Drug use: No   Sexual activity: Yes    Partners: Male    Birth control/protection: None  Other Topics Concern   Not on file  Social History Narrative   Exercise --- no   Social Determinants of Health   Financial Resource Strain: Not on file  Food Insecurity: Not on file  Transportation Needs: Not on file  Physical Activity: Not on file  Stress: Not on file  Social Connections: Not on file   No Known Allergies Family History  Problem Relation Age of Onset   Hyperlipidemia Mother    Heart attack Mother    Coronary artery disease Father    Hyperlipidemia Father    Diabetes Father    Hypertension Father    Diabetes Sister  type I---half sister   Alcoholism Brother    Breast cancer Maternal Aunt    Cancer Maternal Aunt        breast ca   Heart attack Maternal Aunt    Cancer Maternal Aunt        fallopian tubes   Cancer Maternal Uncle        tumor kidney, colon cancer   Cancer Paternal Aunt         breast   Breast cancer Paternal Aunt    Diabetes Maternal Grandmother    Heart disease Maternal Grandmother        CHF   Multiple sclerosis Cousin     Current Outpatient Medications (Endocrine & Metabolic):    Norethindrone  Acetate-Ethinyl Estradiol (LOESTRIN 1.5/30, 21,) 1.5-30 MG-MCG tablet, Take 1 tablet by mouth daily.   Current Outpatient Medications (Respiratory):    fluticasone (FLONASE) 50 MCG/ACT nasal spray, Place 2 sprays into both nostrils daily.    Current Outpatient Medications (Other):    doxycycline (VIBRA-TABS) 100 MG tablet, Take 1 tablet (100 mg total) by mouth 2 (two) times daily.   Lactic Ac-Citric Ac-Pot Bitart (PHEXXI) 1.8-1-0.4 % GEL, Place 1 application vaginally daily. May use 2-3 times per week.   valACYclovir (VALTREX) 1000 MG tablet, Take 1 tablet (1,000 mg total) by mouth daily.   Vitamin D, Ergocalciferol, (DRISDOL) 1.25 MG (50000 UNIT) CAPS capsule, Take 1 capsule (50,000 Units total) by mouth every 7 (seven) days.   Reviewed prior external information including notes and imaging from  primary care provider As well as notes that were available from care everywhere and other healthcare systems.  Past medical history, social, surgical and family history all reviewed in electronic medical record.  No pertanent information unless stated regarding to the chief complaint.   Review of Systems:  No headache, visual changes, nausea, vomiting, diarrhea, constipation, dizziness, abdominal pain, skin rash, fevers, chills, night sweats, weight loss, swollen lymph nodes, body aches, joint swelling, chest pain, shortness of breath, mood changes. POSITIVE muscle aches  Objective  Blood pressure 118/74, pulse 92, height 5\' 8"  (1.727 m), weight 145 lb (65.8 kg), SpO2 98 %.   General: No apparent distress alert and oriented x3 mood and affect normal, dressed appropriately.  HEENT: Pupils equal, extraocular movements intact  Respiratory: Patient's speak in full sentences and does not appear short of breath  Cardiovascular: No lower extremity edema, non tender, no erythema  Gait normal with good balance and coordination.  MSK: Patient does have a knee effusion noted on the right side.  Mostly  of the patellofemoral joint.  Tender to palpation over the medial joint line.  No significant instability noted.    Impression and Recommendations:     The above documentation has been reviewed and is accurate and complete Lyndal Pulley, DO

## 2021-12-01 ENCOUNTER — Ambulatory Visit (INDEPENDENT_AMBULATORY_CARE_PROVIDER_SITE_OTHER): Payer: 59 | Admitting: Family Medicine

## 2021-12-01 ENCOUNTER — Other Ambulatory Visit: Payer: Self-pay

## 2021-12-01 DIAGNOSIS — M1711 Unilateral primary osteoarthritis, right knee: Secondary | ICD-10-CM

## 2021-12-01 NOTE — Assessment & Plan Note (Signed)
Patient is patellofemoral as well as medial joint space narrowing.  MRI of the knee does show near full-thickness medial compartment arthritic changes noted.  Patient was to continue with conservative therapy.  Worsening pain consider steroid injection and repeating viscosupplementation.  Patient will be due to have that done in March.  Patient was given a medial unloader brace that I think will help to slow down the progression of the knee arthritis.  Follow-up with me again 6 to 8 weeks

## 2021-12-01 NOTE — Patient Instructions (Signed)
Wear brace when active  See you again in 6 weeks just to check in Due for Gel in March

## 2022-01-08 ENCOUNTER — Encounter: Payer: Self-pay | Admitting: Family Medicine

## 2022-01-12 ENCOUNTER — Ambulatory Visit: Payer: 59 | Admitting: Family Medicine

## 2022-01-20 ENCOUNTER — Encounter: Payer: Self-pay | Admitting: Obstetrics & Gynecology

## 2022-01-20 ENCOUNTER — Other Ambulatory Visit: Payer: Self-pay

## 2022-01-20 DIAGNOSIS — N921 Excessive and frequent menstruation with irregular cycle: Secondary | ICD-10-CM

## 2022-01-20 MED ORDER — NORETHINDRONE ACET-ETHINYL EST 1.5-30 MG-MCG PO TABS: 1.0000 | ORAL_TABLET | Freq: Every day | ORAL | 11 refills | Status: DC

## 2022-03-01 ENCOUNTER — Other Ambulatory Visit (HOSPITAL_BASED_OUTPATIENT_CLINIC_OR_DEPARTMENT_OTHER): Payer: Self-pay | Admitting: Family Medicine

## 2022-03-01 ENCOUNTER — Ambulatory Visit (INDEPENDENT_AMBULATORY_CARE_PROVIDER_SITE_OTHER): Payer: 59 | Admitting: Family Medicine

## 2022-03-01 ENCOUNTER — Encounter: Payer: Self-pay | Admitting: Family Medicine

## 2022-03-01 VITALS — BP 98/68 | HR 85 | Temp 98.4°F | Resp 16 | Ht 68.0 in | Wt 145.6 lb

## 2022-03-01 DIAGNOSIS — G8929 Other chronic pain: Secondary | ICD-10-CM

## 2022-03-01 DIAGNOSIS — E559 Vitamin D deficiency, unspecified: Secondary | ICD-10-CM | POA: Diagnosis not present

## 2022-03-01 DIAGNOSIS — Z1211 Encounter for screening for malignant neoplasm of colon: Secondary | ICD-10-CM

## 2022-03-01 DIAGNOSIS — Z0000 Encounter for general adult medical examination without abnormal findings: Secondary | ICD-10-CM | POA: Diagnosis not present

## 2022-03-01 DIAGNOSIS — M25511 Pain in right shoulder: Secondary | ICD-10-CM | POA: Diagnosis not present

## 2022-03-01 DIAGNOSIS — Z1231 Encounter for screening mammogram for malignant neoplasm of breast: Secondary | ICD-10-CM

## 2022-03-01 LAB — COMPREHENSIVE METABOLIC PANEL
ALT: 12 U/L (ref 0–35)
AST: 15 U/L (ref 0–37)
Albumin: 4.2 g/dL (ref 3.5–5.2)
Alkaline Phosphatase: 53 U/L (ref 39–117)
BUN: 13 mg/dL (ref 6–23)
CO2: 23 mEq/L (ref 19–32)
Calcium: 9.6 mg/dL (ref 8.4–10.5)
Chloride: 104 mEq/L (ref 96–112)
Creatinine, Ser: 0.85 mg/dL (ref 0.40–1.20)
GFR: 82.8 mL/min (ref >60.00)
Glucose, Bld: 86 mg/dL (ref 70–99)
Potassium: 4.7 mEq/L (ref 3.5–5.1)
Sodium: 137 mEq/L (ref 135–145)
Total Bilirubin: 0.5 mg/dL (ref 0.2–1.2)
Total Protein: 6.9 g/dL (ref 6.0–8.3)

## 2022-03-01 LAB — TSH: TSH: 0.93 u[IU]/mL (ref 0.35–5.50)

## 2022-03-01 LAB — CBC WITH DIFFERENTIAL/PLATELET
Basophils Absolute: 0 10*3/uL (ref 0.0–0.1)
Basophils Relative: 0.6 % (ref 0.0–3.0)
Eosinophils Absolute: 0 10*3/uL (ref 0.0–0.7)
Eosinophils Relative: 0.8 % (ref 0.0–5.0)
HCT: 37.7 % (ref 36.0–46.0)
Hemoglobin: 12.5 g/dL (ref 12.0–15.0)
Lymphocytes Relative: 26.7 % (ref 12.0–46.0)
Lymphs Abs: 1.4 10*3/uL (ref 0.7–4.0)
MCHC: 33.1 g/dL (ref 30.0–36.0)
MCV: 91.3 fl (ref 78.0–100.0)
Monocytes Absolute: 0.4 10*3/uL (ref 0.1–1.0)
Monocytes Relative: 7.7 % (ref 3.0–12.0)
Neutro Abs: 3.3 10*3/uL (ref 1.4–7.7)
Neutrophils Relative %: 64.2 % (ref 43.0–77.0)
Platelets: 375 10*3/uL (ref 150.0–400.0)
RBC: 4.13 Mil/uL (ref 3.87–5.11)
RDW: 14.3 % (ref 11.5–15.5)
WBC: 5.1 10*3/uL (ref 4.0–10.5)

## 2022-03-01 LAB — LIPID PANEL
Cholesterol: 233 mg/dL — ABNORMAL HIGH (ref 0–200)
HDL: 72.8 mg/dL (ref >39.00)
LDL Cholesterol: 124 mg/dL — ABNORMAL HIGH (ref 0–99)
NonHDL: 159.95
Total CHOL/HDL Ratio: 3
Triglycerides: 179 mg/dL — ABNORMAL HIGH (ref 0.0–149.0)
VLDL: 35.8 mg/dL (ref 0.0–40.0)

## 2022-03-01 LAB — VITAMIN D 25 HYDROXY (VIT D DEFICIENCY, FRACTURES): VITD: 20.9 ng/mL — ABNORMAL LOW (ref 30.00–100.00)

## 2022-03-01 NOTE — Assessment & Plan Note (Signed)
Pt will try massage and then f/u sport med if no better  ?

## 2022-03-01 NOTE — Patient Instructions (Signed)

## 2022-03-01 NOTE — Progress Notes (Signed)
Subjective:   By signing my name below, I, Carylon Perches, attest that this documentation has been prepared under the direction and in the presence of Roma Schanz DO, 03/01/2022   Patient ID: Jeanne Davis, female    DOB: 1976-05-02, 46 y.o.   MRN: [id number]  Chief Complaint  Patient presents with   Annual Exam    Pt states fasting     HPI Patient is in today for a comprehensive physical exam.  Patient complains of pain in right shoulder. Patient denies of any falling. She states that she does sleep on her right shoulder. She is recommended to get a deep tissue massage. If it doesn't feel better then she is recommended to see Dr. Tamala Julian, sport med.     Patient has discontinued use of Vitamin D. She is recommended to take 1000 units of OTC of Vitamin D.   She also has discontinued use of Flonase.   Patient states that she has been going to visit Dr. Tamala Julian for bilateral swelling in lower legs (Last visit 12/01/2021).  She states that she has not been exercising as much due to pain in her knees.  She denies having any fever, ear pain, joint pain, new moles, congestion, sinus pain, sore throat, palpations, wheezing, n/v/d, constipation, blood in stool, dysuria, frequency, hematuria at this time  Patient has received the 4th COVID - 19 booster and the recent flu vaccination on 10/09/2021.   Mammogram: Last completed on 03/31/2021 Pap Smear: Planning for upcoming appointment.   No recent changes to family medical history.   History reviewed. No pertinent past medical history.  Past Surgical History:  Procedure Laterality Date   WISDOM TOOTH EXTRACTION      Family History  Problem Relation Age of Onset   Hyperlipidemia Mother    Heart attack Mother    Coronary artery disease Father    Hyperlipidemia Father    Diabetes Father    Hypertension Father    Diabetes Sister        type I---half sister   Alcoholism Brother    Breast cancer Maternal Aunt    Cancer  Maternal Aunt        breast ca   Heart attack Maternal Aunt    Cancer Maternal Aunt        fallopian tubes   Cancer Maternal Uncle        tumor kidney, colon cancer   Cancer Paternal Aunt         breast   Breast cancer Paternal Aunt    Diabetes Maternal Grandmother    Heart disease Maternal Grandmother        CHF   Multiple sclerosis Cousin     Social History   Socioeconomic History   Marital status: Single    Spouse name: Not on file   Number of children: Not on file   Years of education: Not on file   Highest education level: Not on file  Occupational History   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[place]  Tobacco Use   Smoking status: Never   Smokeless tobacco: Never  Vaping Use   Vaping Use: Never used  Substance and Sexual Activity   Alcohol use: No   Drug use: No   Sexual activity: Yes    Partners: Male    Birth control/protection: None  Other Topics Concern   Not on file  Social History Narrative   Exercise --- no   Social Determinants of Health   Financial Resource Strain: Not  on file  Food Insecurity: Not on file  Transportation Needs: Not on file  Physical Activity: Not on file  Stress: Not on file  Social Connections: Not on file  Intimate Partner Violence: Not on file    Outpatient Medications Prior to Visit  Medication Sig Dispense Refill   fluticasone (FLONASE) 50 MCG/ACT nasal spray Place 2 sprays into both nostrils daily. 16 g 6   Lactic Ac-Citric Ac-Pot Bitart (PHEXXI) 1.8-1-0.4 % GEL Place 1 application vaginally daily. May use 2-3 times per week. 60 g 3   Norethindrone Acetate-Ethinyl Estradiol (LOESTRIN 1.5/30, 21,) 1.5-30 MG-MCG tablet Take 1 tablet by mouth daily. 28 tablet 11   Vitamin D, Ergocalciferol, (DRISDOL) 1.25 MG (50000 UNIT) CAPS capsule Take 1 capsule (50,000 Units total) by mouth every 7 (seven) days. 12 capsule 0   doxycycline (VIBRA-TABS) 100 MG tablet Take 1 tablet (100 mg total) by mouth 2 (two) times daily. 14 tablet 0    valACYclovir (VALTREX) 1000 MG tablet Take 1 tablet (1,000 mg total) by mouth daily. 90 tablet 3   No facility-administered medications prior to visit.    No Known Allergies  Review of Systems  Constitutional:  Negative for fever.  HENT:  Negative for congestion, ear pain and sinus pain.   Respiratory:  Negative for wheezing.   Cardiovascular:  Negative for palpitations.  Gastrointestinal:  Negative for blood in stool, constipation, diarrhea, nausea and vomiting.  Genitourinary:  Negative for dysuria, frequency and hematuria.  Musculoskeletal:  Negative for falls, joint pain and myalgias.       (+) Right shoulder pain  Skin:        (-) new moles      Objective:    Physical Exam Constitutional:      General: She is not in acute distress.    Appearance: Normal appearance. She is not ill-appearing.  HENT:     Head: Normocephalic and atraumatic.     Right Ear: External ear normal.     Left Ear: External ear normal.  Eyes:     Extraocular Movements: Extraocular movements intact.     Pupils: Pupils are equal, round, and reactive to light.  Cardiovascular:     Rate and Rhythm: Normal rate and regular rhythm.     Pulses: Normal pulses.     Heart sounds: Normal heart sounds. No murmur heard.   No gallop.  Pulmonary:     Effort: Pulmonary effort is normal. No respiratory distress.     Breath sounds: Normal breath sounds. No wheezing or rales.  Abdominal:     General: Bowel sounds are normal. There is no distension.     Palpations: Abdomen is soft.     Tenderness: There is no abdominal tenderness. There is no guarding.  Musculoskeletal:        General: Tenderness present.     Right shoulder: Tenderness present. No crepitus. Decreased range of motion. Normal strength.     Left shoulder: Normal.  Skin:    General: Skin is warm and dry.  Neurological:     Mental Status: She is alert and oriented to person, place, and time.  Psychiatric:        Judgment: Judgment normal.     BP 98/68 (BP Location: Left Arm, Patient Position: Sitting, Cuff Size: Normal)    Pulse 85    Temp 98.4 F (36.9 C) (Oral)    Resp 16    Ht '5\' 8"'$  (1.727 m)    Wt 145 lb 9.6 oz (66  kg)    SpO2 99%    BMI 22.14 kg/m  Wt Readings from Last 3 Encounters:  03/01/22 145 lb 9.6 oz (66 kg)  12/01/21 145 lb (65.8 kg)  10/13/21 145 lb (65.8 kg)    Diabetic Foot Exam - Simple   No data filed    Lab Results  Component Value Date   WBC 4.6 05/05/2021   HGB 13.0 05/05/2021   HCT 37.9 05/05/2021   PLT 399.0 05/05/2021   GLUCOSE 96 05/05/2021   CHOL 218 (H) 02/27/2021   TRIG 135.0 02/27/2021   HDL 80.60 02/27/2021   LDLCALC 110 (H) 02/27/2021   ALT 13 05/05/2021   AST 14 05/05/2021   NA 135 05/05/2021   K 3.6 05/05/2021   CL 104 05/05/2021   CREATININE 0.86 05/05/2021   BUN 14 05/05/2021   CO2 23 05/05/2021   TSH 0.60 05/05/2021    Lab Results  Component Value Date   TSH 0.60 05/05/2021   Lab Results  Component Value Date   WBC 4.6 05/05/2021   HGB 13.0 05/05/2021   HCT 37.9 05/05/2021   MCV 93.5 05/05/2021   PLT 399.0 05/05/2021   Lab Results  Component Value Date   NA 135 05/05/2021   K 3.6 05/05/2021   CO2 23 05/05/2021   GLUCOSE 96 05/05/2021   BUN 14 05/05/2021   CREATININE 0.86 05/05/2021   BILITOT 0.4 05/05/2021   ALKPHOS 71 05/05/2021   AST 14 05/05/2021   ALT 13 05/05/2021   PROT 7.4 05/05/2021   ALBUMIN 4.2 05/05/2021   CALCIUM 9.4 05/05/2021   CALCIUM 9.7 05/05/2021   GFR 82.12 05/05/2021   Lab Results  Component Value Date   CHOL 218 (H) 02/27/2021   Lab Results  Component Value Date   HDL 80.60 02/27/2021   Lab Results  Component Value Date   LDLCALC 110 (H) 02/27/2021   Lab Results  Component Value Date   TRIG 135.0 02/27/2021   Lab Results  Component Value Date   CHOLHDL 3 02/27/2021   No results found for: HGBA1C     Assessment & Plan:   Problem List Items Addressed This Visit       Unprioritized   Vitamin D  deficiency   Relevant Orders   VITAMIN D 25 Hydroxy (Vit-D Deficiency, Fractures)   Chronic right shoulder pain    Pt will try massage and then f/u sport med if no better       Preventative health care - Primary    ghm utd Check labs  See avs       Relevant Orders   Comprehensive metabolic panel   CBC with Differential/Platelet   Lipid panel   TSH   Other Visit Diagnoses     Colon cancer screening       Relevant Orders   Ambulatory referral to Gastroenterology        No orders of the defined types were placed in this encounter.   IAnn Held, DO, personally preformed the services described in this documentation.  All medical record entries made by the scribe were at my direction and in my presence.  I have reviewed the chart and discharge instructions (if applicable) and agree that the record reflects my personal performance and is accurate and complete. 03/01/2022   I,Amber Collins,acting as a scribe for [name], DO.,have documented all relevant documentation on the behalf of [name], DO,as directed by  Rosalita Chessman  Chase, DO while in the presence of [name], DO.    [name], DO

## 2022-03-01 NOTE — Assessment & Plan Note (Signed)
ghm utd Check labs  See avs  

## 2022-04-06 ENCOUNTER — Ambulatory Visit (HOSPITAL_BASED_OUTPATIENT_CLINIC_OR_DEPARTMENT_OTHER)
Admission: RE | Admit: 2022-04-06 | Discharge: 2022-04-06 | Disposition: A | Discharge status: Home / Self Care | Payer: 59 | Source: Ambulatory Visit | Attending: Family Medicine | Admitting: Family Medicine

## 2022-04-06 ENCOUNTER — Encounter (HOSPITAL_BASED_OUTPATIENT_CLINIC_OR_DEPARTMENT_OTHER): Payer: Self-pay

## 2022-04-06 DIAGNOSIS — Z1231 Encounter for screening mammogram for malignant neoplasm of breast: Secondary | ICD-10-CM | POA: Insufficient documentation

## 2022-04-06 IMAGING — MG MM DIGITAL SCREENING BILAT W/ TOMO AND CAD
6 of 12 series · 6 of 36 positions shown · non-contrast
Comparison: Previous exam(s).

CLINICAL DATA: Screening.

EXAM:
DIGITAL SCREENING BILATERAL MAMMOGRAM WITH TOMOSYNTHESIS AND CAD
TECHNIQUE: Bilateral screening digital craniocaudal and mediolateral oblique
mammograms were obtained. Bilateral screening digital breast
tomosynthesis was performed. The images were evaluated with
computer-aided detection.

[R CC synth-2D]
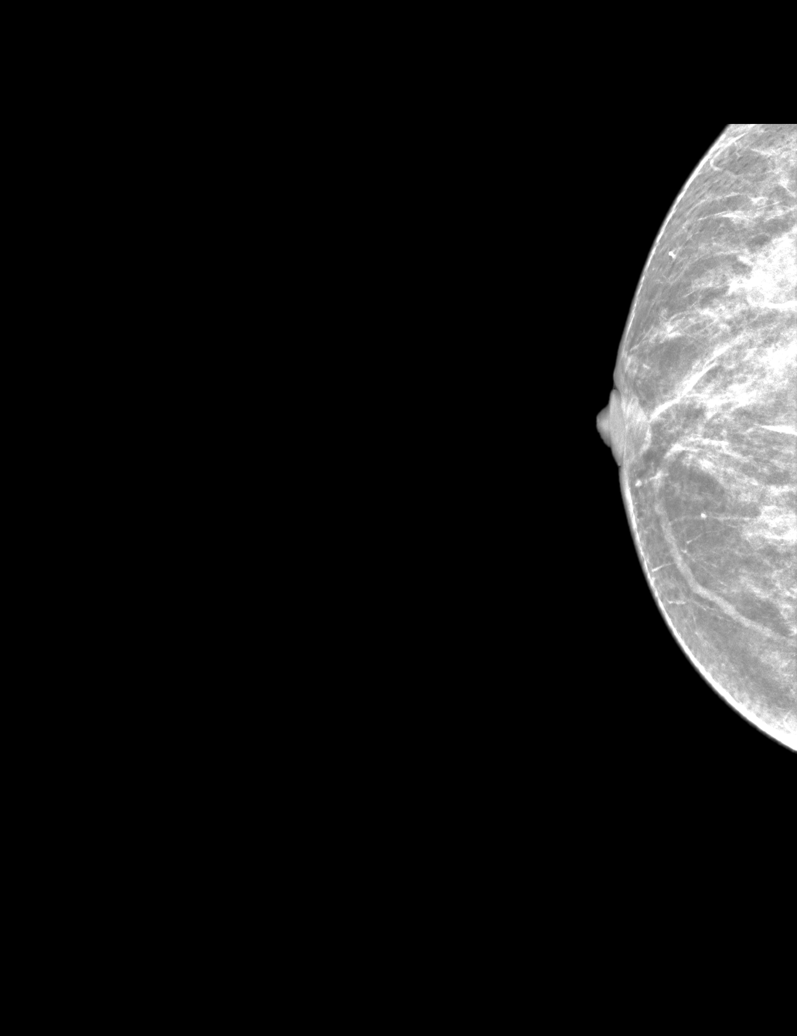

[R XCCL synth-2D]
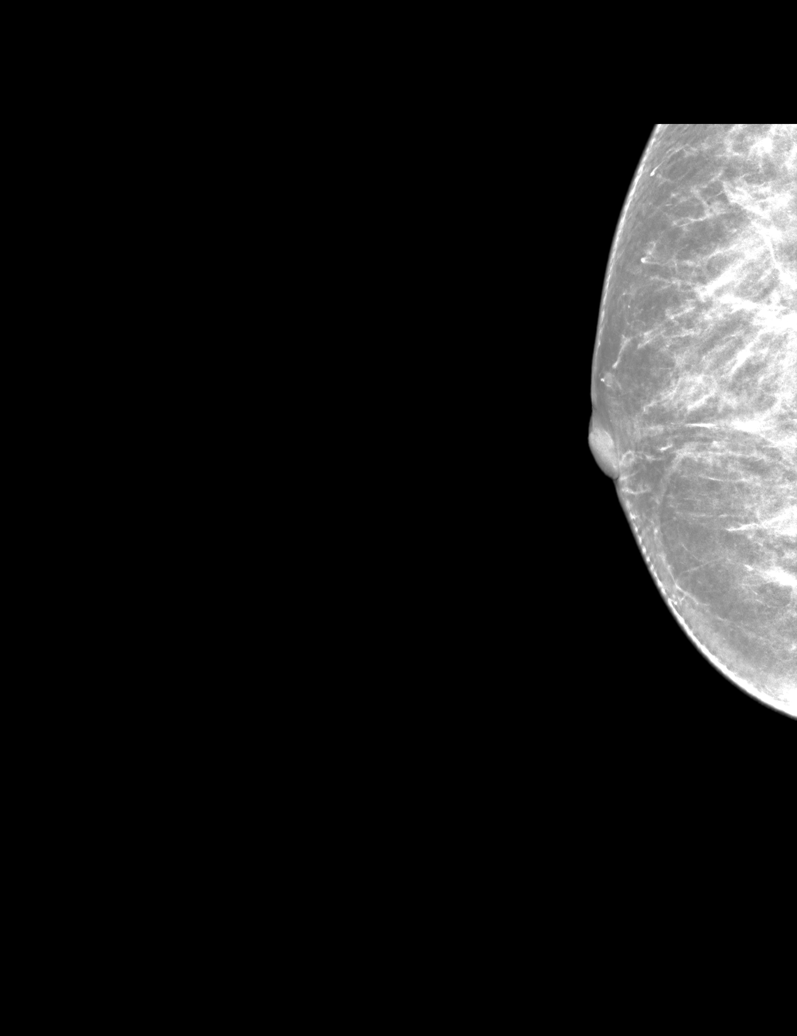

[L MLO synth-2D (1 of 2)]
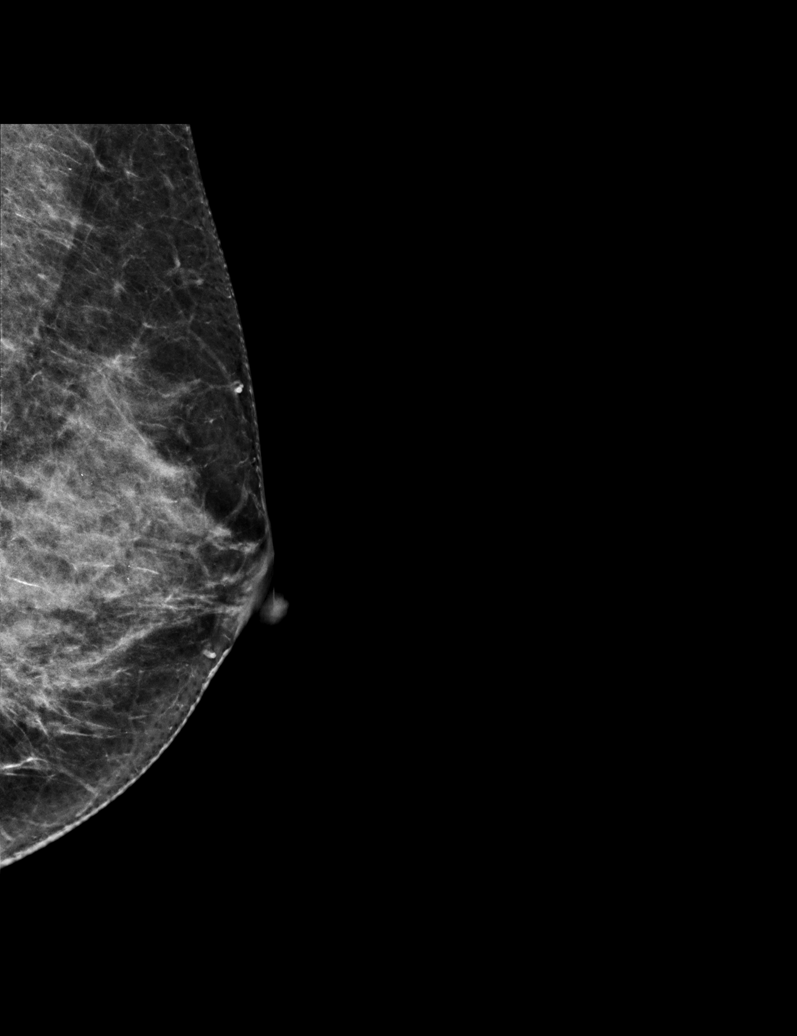

[L MLO synth-2D (2 of 2)]
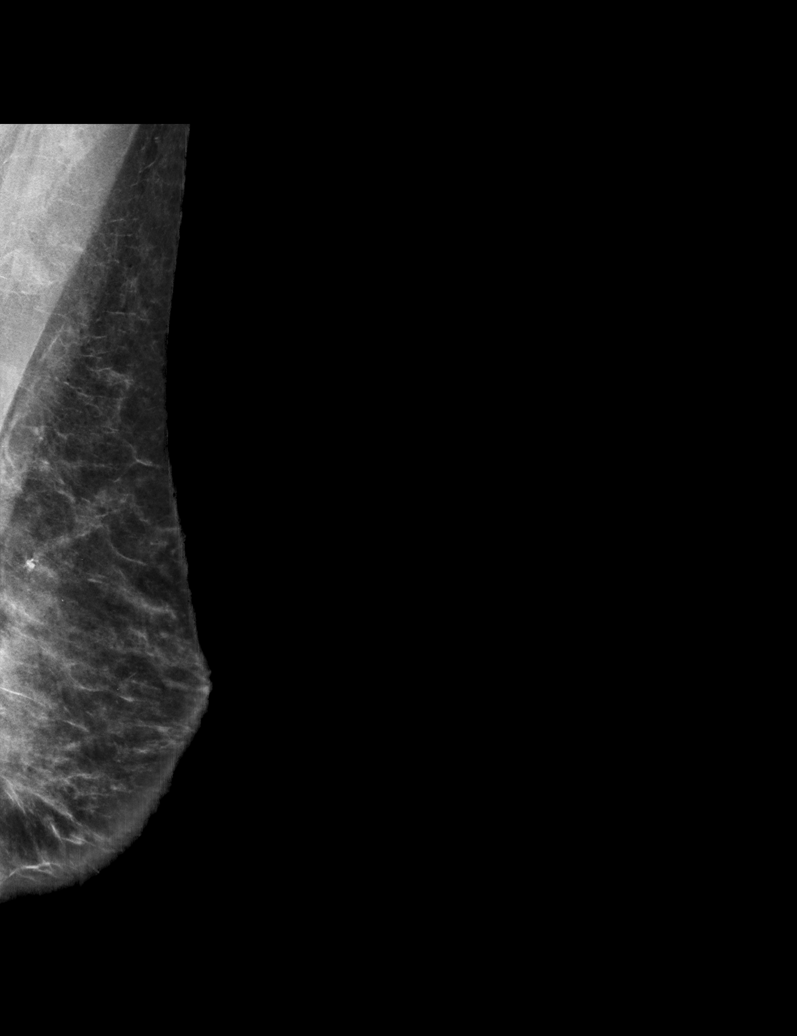

[L CC synth-2D]
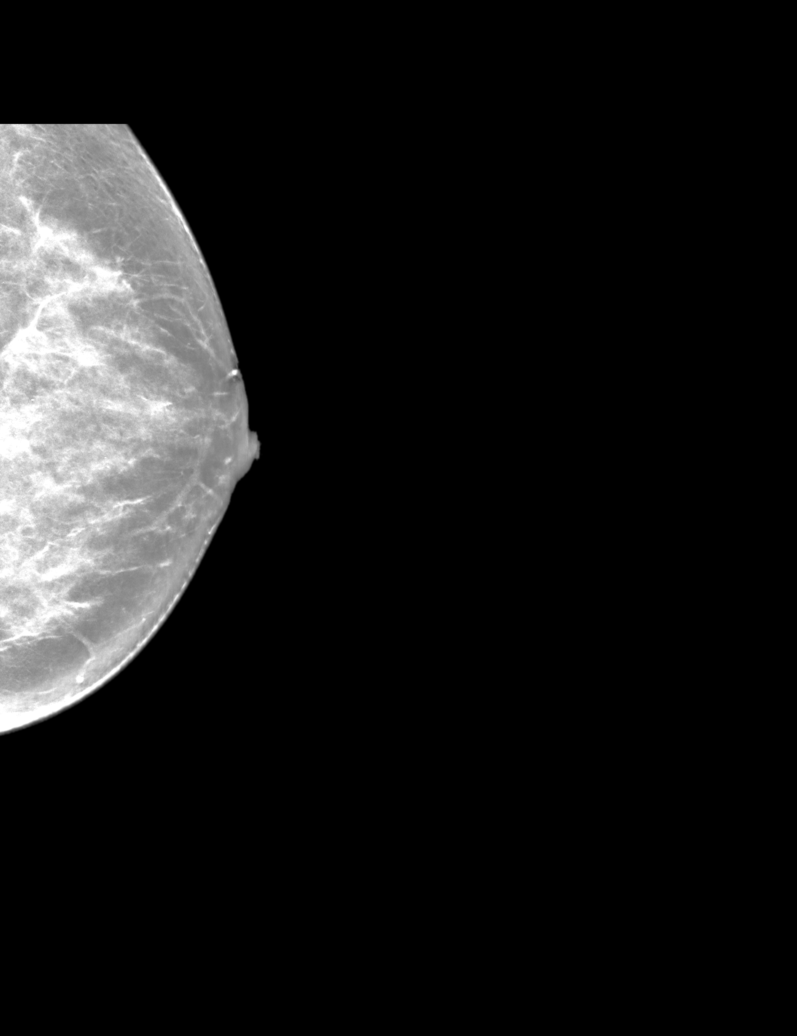

[R MLO synth-2D]
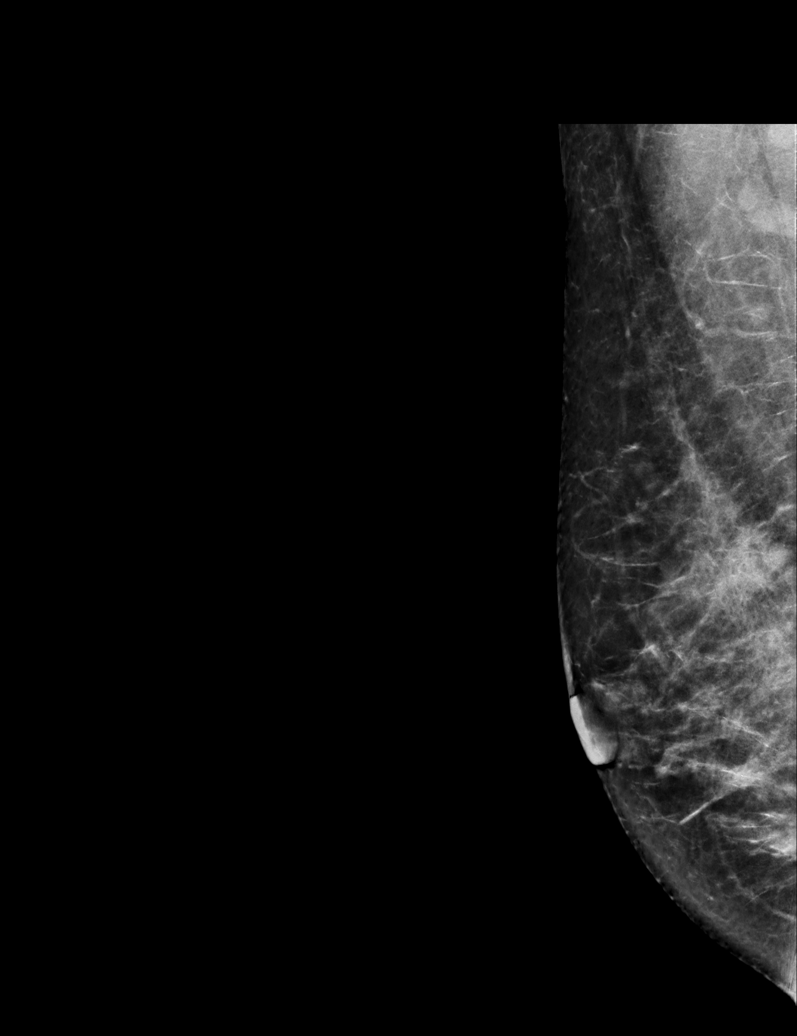

[6 of 36 positions shown; findings below may reference images not displayed]

ACR Breast Density Category d: The breast tissue is extremely dense,
which lowers the sensitivity of mammography.
FINDINGS: In the right breast, a possible asymmetry warrants further
evaluation. In the left breast, no findings suspicious for
malignancy.
IMPRESSION: Further evaluation is suggested for possible asymmetry in the right
breast.

RECOMMENDATION:
Diagnostic mammogram and possibly ultrasound of the right breast.
(Code:[1T])

The patient will be contacted regarding the findings, and additional
imaging will be scheduled.

BI-RADS CATEGORY  0: Incomplete. Need additional imaging evaluation
and/or prior mammograms for comparison.

## 2022-04-08 ENCOUNTER — Other Ambulatory Visit: Payer: Self-pay | Admitting: Family Medicine

## 2022-04-08 DIAGNOSIS — R928 Other abnormal and inconclusive findings on diagnostic imaging of breast: Secondary | ICD-10-CM

## 2022-04-21 ENCOUNTER — Ambulatory Visit
Admission: RE | Admit: 2022-04-21 | Discharge: 2022-04-21 | Disposition: A | Discharge status: Home / Self Care | Payer: 59 | Source: Ambulatory Visit | Attending: Family Medicine | Admitting: Family Medicine

## 2022-04-21 ENCOUNTER — Ambulatory Visit: Payer: 59

## 2022-04-21 DIAGNOSIS — R928 Other abnormal and inconclusive findings on diagnostic imaging of breast: Secondary | ICD-10-CM

## 2022-04-21 IMAGING — MG MM DIGITAL DIAGNOSTIC UNILAT*R* W/ TOMO W/ CAD
4 series · 4 of 12 positions shown · non-contrast
Comparison: Previous exam(s).

CLINICAL DATA: Screening recall for a possible right breast
asymmetry.

EXAM:
DIGITAL DIAGNOSTIC UNILATERAL RIGHT MAMMOGRAM WITH TOMOSYNTHESIS AND
CAD
TECHNIQUE: Right digital diagnostic mammography and breast tomosynthesis was
performed. The images were evaluated with computer-aided detection.

[R ML synth-2D]
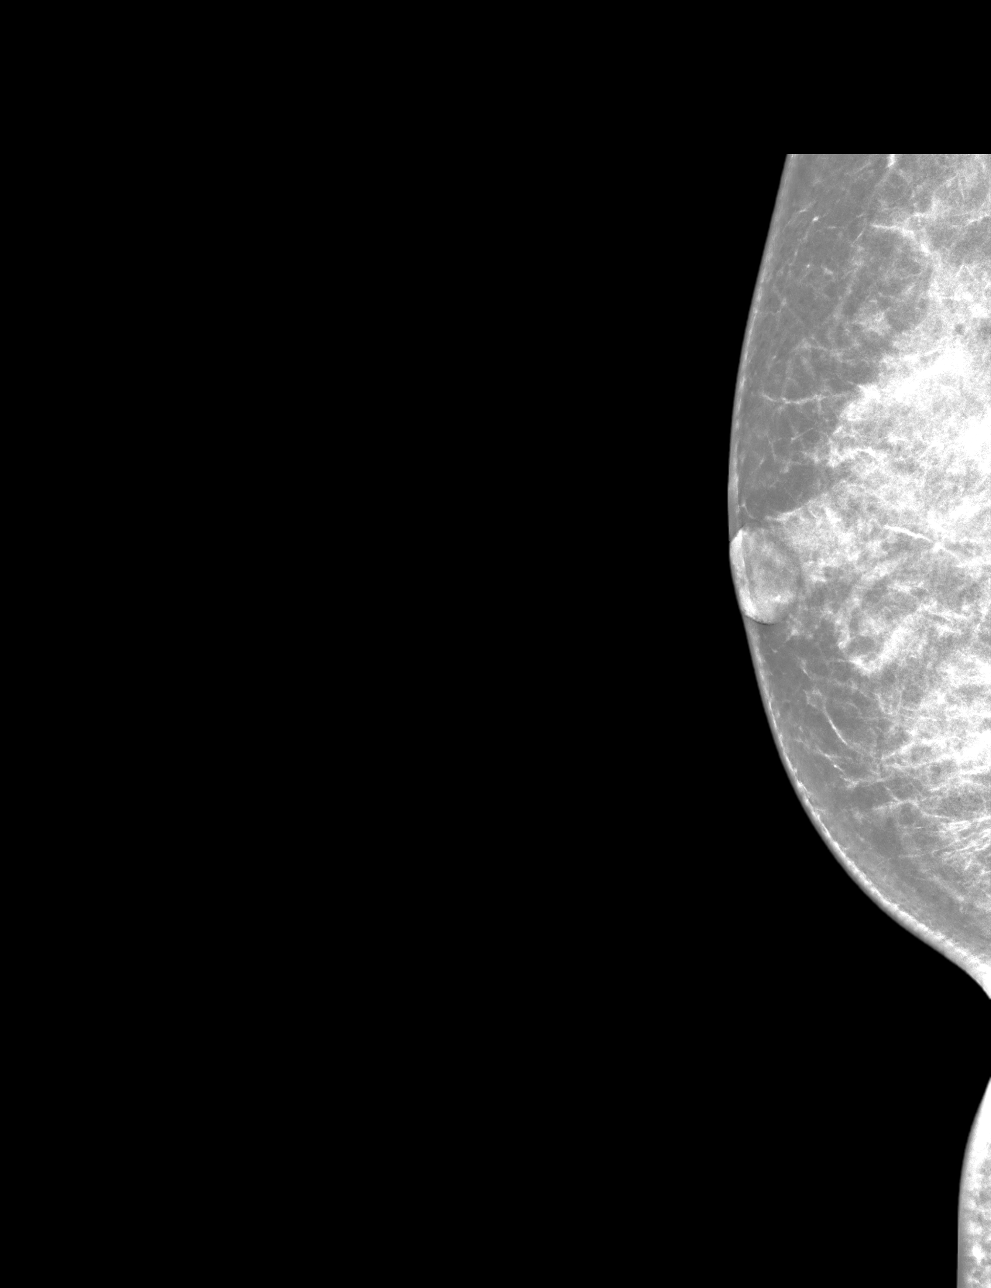

[R MLO synth-2D]
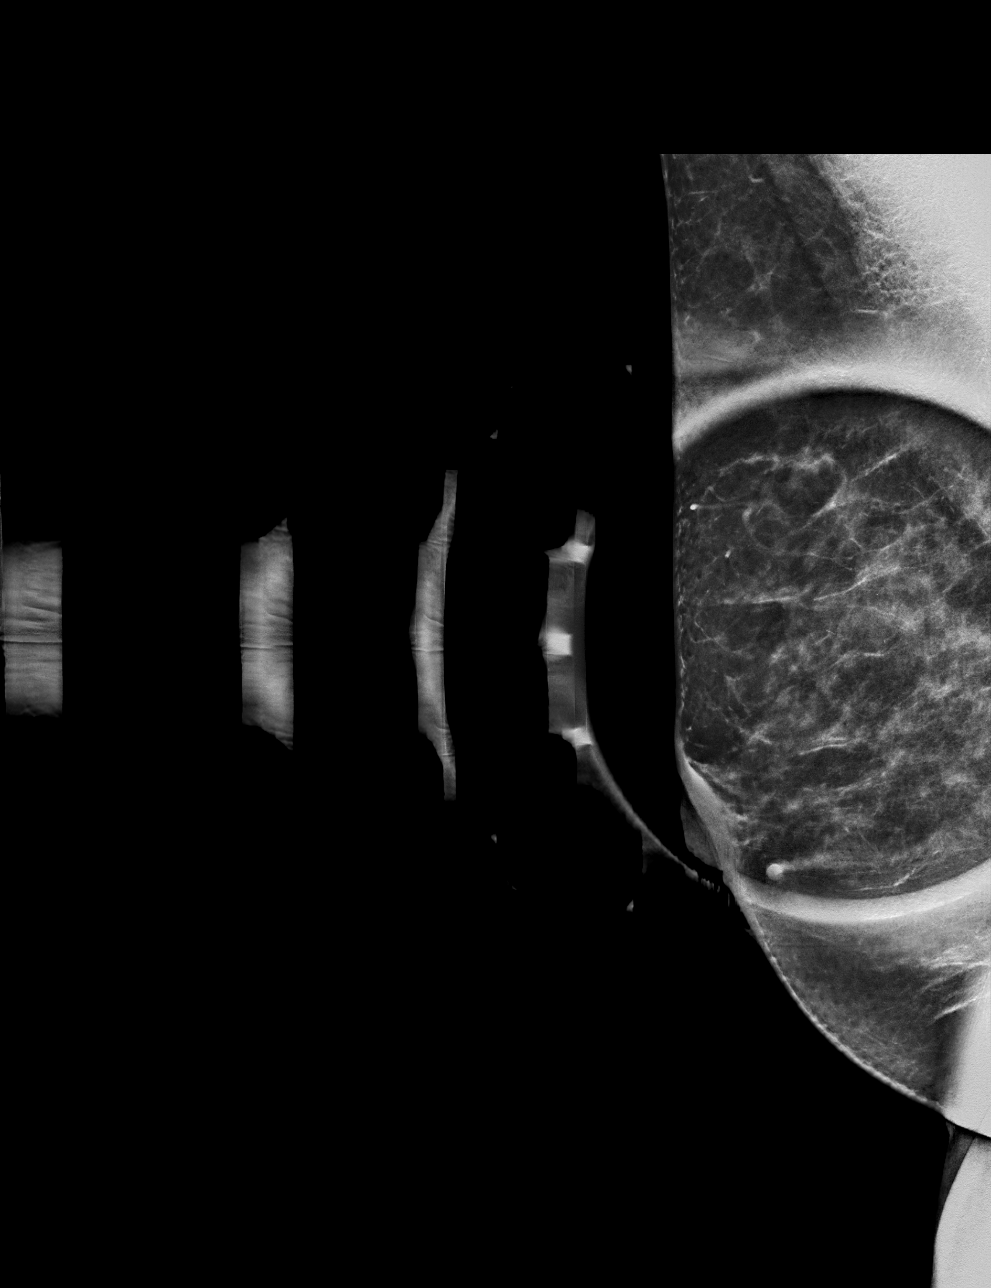

[R ML tomo · tomo slice 33/64.0]
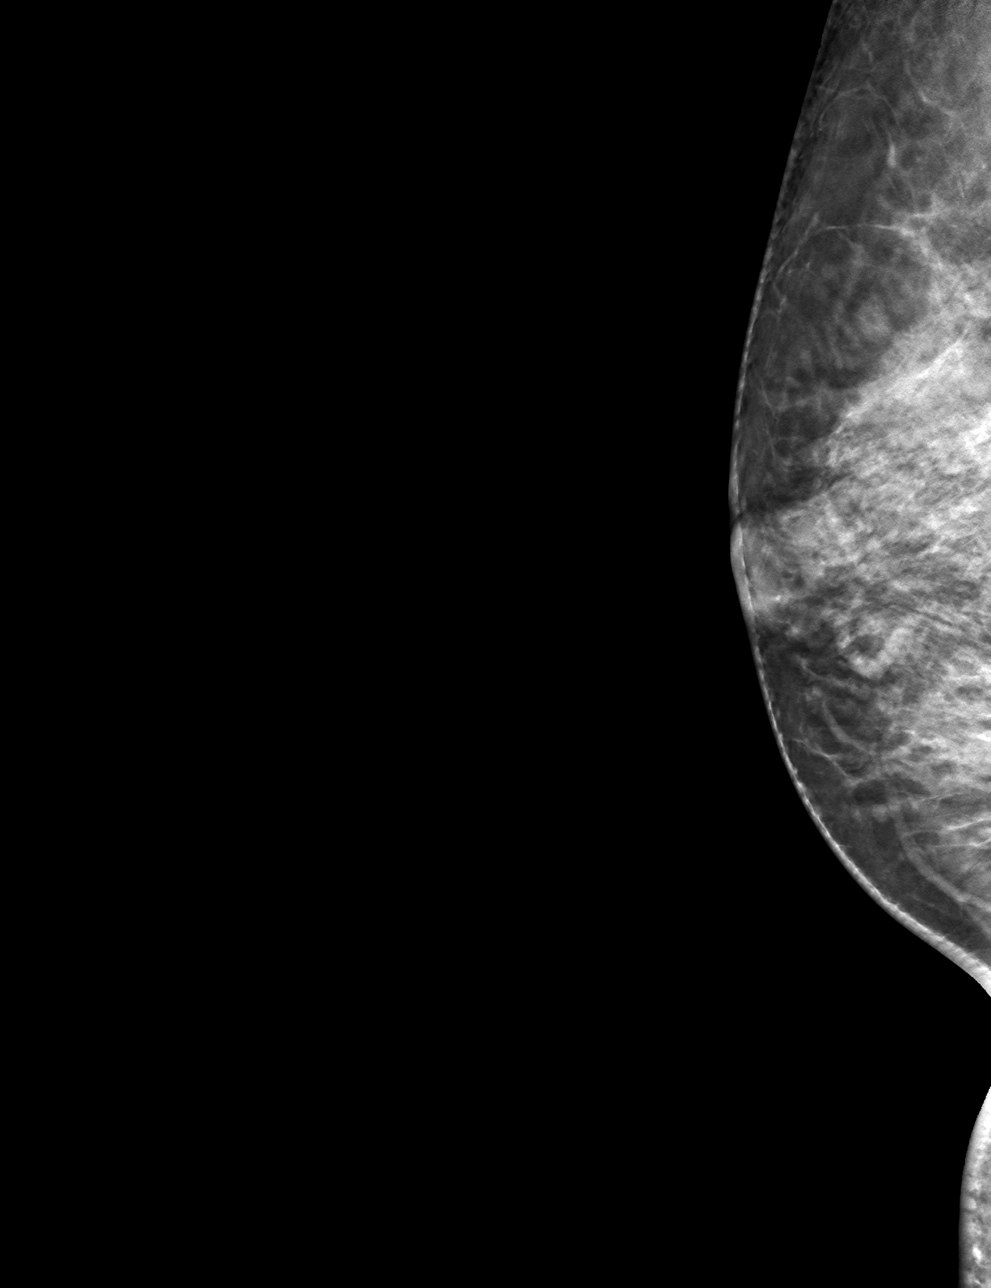

[R MLO tomo · tomo slice 33/64.0]
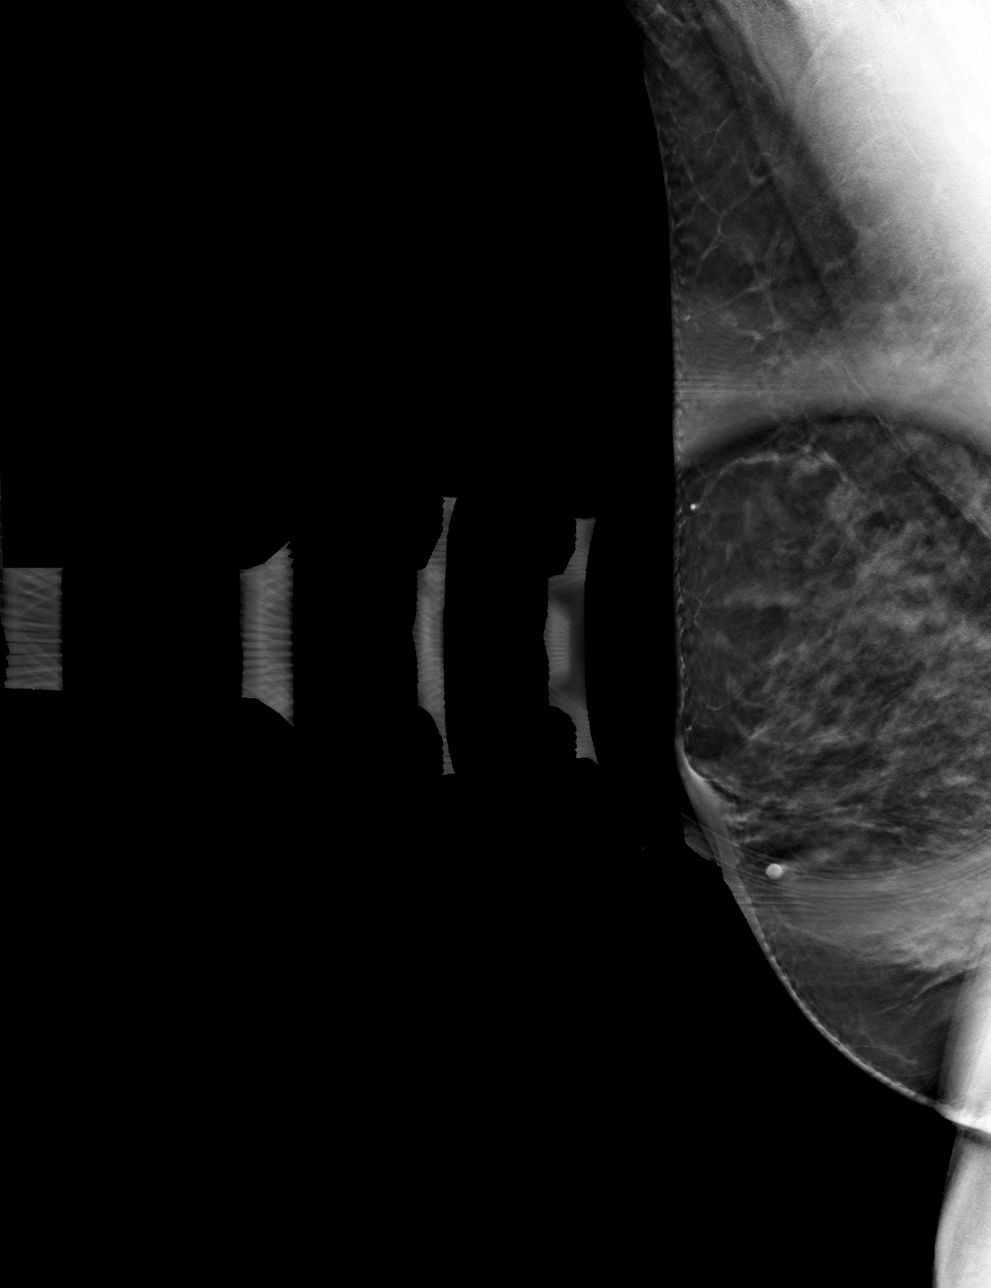

[4 of 12 positions shown; findings below may reference images not displayed]

ACR Breast Density Category c: The breast tissue is heterogeneously
dense, which may obscure small masses.
FINDINGS: On the diagnostic spot compression images, the possible asymmetry
noted on the current screening right breast MLO view disperses
consistent with superimposed fibroglandular tissue. There is no
underlying mass or significant residual asymmetry. There are no
areas of architectural distortion.
IMPRESSION: No evidence of breast malignancy.

RECOMMENDATION:
Screening mammogram in one year.(Code:[CO])

I have discussed the findings and recommendations with the patient.
If applicable, a reminder letter will be sent to the patient
regarding the next appointment.

BI-RADS CATEGORY  1: Negative.

## 2022-12-01 ENCOUNTER — Ambulatory Visit: Payer: 59 | Admitting: Family Medicine

## 2022-12-01 VITALS — BP 111/69 | HR 78 | Temp 98.4°F | Wt 151.0 lb

## 2022-12-01 DIAGNOSIS — R051 Acute cough: Secondary | ICD-10-CM | POA: Diagnosis not present

## 2022-12-01 DIAGNOSIS — B9689 Other specified bacterial agents as the cause of diseases classified elsewhere: Secondary | ICD-10-CM | POA: Diagnosis not present

## 2022-12-01 DIAGNOSIS — J069 Acute upper respiratory infection, unspecified: Secondary | ICD-10-CM | POA: Diagnosis not present

## 2022-12-01 DIAGNOSIS — J988 Other specified respiratory disorders: Secondary | ICD-10-CM | POA: Diagnosis not present

## 2022-12-01 LAB — POCT INFLUENZA A/B
Influenza A, POC: NEGATIVE
Influenza B, POC: NEGATIVE

## 2022-12-01 LAB — POC COVID19 BINAXNOW: SARS Coronavirus 2 Ag: NEGATIVE

## 2022-12-01 MED ORDER — FLUTICASONE PROPIONATE 50 MCG/ACT NA SUSP: 2.0000 | Freq: Every day | NASAL | 0 refills | Status: DC

## 2022-12-01 MED ORDER — BENZONATATE 200 MG PO CAPS: 200.0000 mg | ORAL_CAPSULE | Freq: Two times a day (BID) | ORAL | 0 refills | Status: DC | PRN

## 2022-12-01 MED ORDER — DOXYCYCLINE HYCLATE 100 MG PO TABS: 100.0000 mg | ORAL_TABLET | Freq: Two times a day (BID) | ORAL | 0 refills | Status: DC

## 2022-12-01 NOTE — Progress Notes (Signed)
Jeanne Davis , Oct 06, 1976, 46 y.o., female MRN: [id number] Patient Care Team    Relationship Specialty Notifications Start End  Carollee Herter, Alferd Apa, [place] PCP - General   12/09/10     Chief Complaint  Patient presents with   Cough    Pt c/o nasal drainage, cough, chest congestion x 1 week     Subjective: Pt presents for an OV with complaints of drainage, dry cough, chest congestion that is worsening over the last week, onset 2 weeks ago.  She has been taking Mucinex to help her with her symptoms.  She reports her chest is burning from her cough.     03/01/2022    8:31 AM 02/27/2021    8:12 AM 12/08/2017    8:41 AM 12/06/2016   10:39 AM  Depression screen PHQ 2/9  Decreased Interest 0 0 0 0  Down, Depressed, Hopeless 0 0 0 0  PHQ - 2 Score 0 0 0 0    No Known Allergies Social History   Social History Narrative   Exercise --- no   History reviewed. No pertinent past medical history. Past Surgical History:  Procedure Laterality Date   WISDOM TOOTH EXTRACTION     Family History  Problem Relation Age of Onset   Hyperlipidemia Mother    Heart attack Mother    Coronary artery disease Father    Hyperlipidemia Father    Diabetes Father    Hypertension Father    Diabetes Sister        type I---half sister   Alcoholism Brother    Breast cancer Maternal Aunt    Cancer Maternal Aunt        breast ca   Heart attack Maternal Aunt    Cancer Maternal Aunt        fallopian tubes   Cancer Maternal Uncle        tumor kidney, colon cancer   Cancer Paternal Aunt         breast   Breast cancer Paternal Aunt    Diabetes Maternal Grandmother    Heart disease Maternal Grandmother        CHF   Multiple sclerosis Cousin    Allergies as of 12/01/2022   No Known Allergies      Medication List        Accurate as of December 01, 2022  5:00 PM. If you have any questions, ask your nurse or doctor.          benzonatate 200 MG capsule Commonly known as:  TESSALON Take 1 capsule (200 mg total) by mouth 2 (two) times daily as needed for cough. Started by: Howard Pouch, DO   doxycycline 100 MG tablet Commonly known as: VIBRA-TABS Take 1 tablet (100 mg total) by mouth 2 (two) times daily. Started by: Howard Pouch, DO   fluticasone 50 MCG/ACT nasal spray Commonly known as: FLONASE Place 2 sprays into both nostrils daily.   Norethindrone Acetate-Ethinyl Estradiol 1.5-30 MG-MCG tablet Commonly known as: Loestrin 1.5/30 (21) Take 1 tablet by mouth daily.   Phexxi 1.8-1-0.4 % Gel Generic drug: Lactic Ac-Citric Ac-Pot Bitart Place 1 application vaginally daily. May use 2-3 times per week.   Vitamin D (Ergocalciferol) 1.25 MG (50000 UNIT) Caps capsule Commonly known as: DRISDOL Take 1 capsule (50,000 Units total) by mouth every 7 (seven) days.        All past medical history, surgical history, allergies, family history, immunizations andmedications were updated in the EMR  today and reviewed under the history and medication portions of their EMR.     Review of Systems  Constitutional:  Positive for malaise/fatigue. Negative for chills and fever.  HENT:  Positive for congestion. Negative for sore throat.   Respiratory:  Positive for cough. Negative for sputum production, shortness of breath and wheezing.   Gastrointestinal:  Negative for abdominal pain, diarrhea, nausea and vomiting.  Musculoskeletal:  Positive for myalgias.  Skin:  Negative for rash.  Neurological:  Positive for headaches. Negative for dizziness.   Negative, with the exception of above mentioned in HPI   Objective:  BP 111/69   Pulse 78   Temp 98.4 F (36.9 C) (Oral)   Wt 151 lb (68.5 kg)   SpO2 100%   BMI 22.96 kg/m  Body mass index is 22.96 kg/m. Physical Exam Vitals and nursing note reviewed.  Constitutional:      General: She is not in acute distress.    Appearance: Normal appearance. She is normal weight. She is not ill-appearing or toxic-appearing.   HENT:     Head: Normocephalic and atraumatic.     Right Ear: Tympanic membrane and ear canal normal.     Left Ear: Tympanic membrane and ear canal normal.     Nose: Congestion and rhinorrhea present.     Right Turbinates: Enlarged and swollen.     Left Turbinates: Enlarged and swollen.     Mouth/Throat:     Pharynx: Posterior oropharyngeal erythema present. No oropharyngeal exudate.  Eyes:     General:        Right eye: No discharge.        Left eye: No discharge.     Extraocular Movements: Extraocular movements intact.     Conjunctiva/sclera: Conjunctivae normal.     Pupils: Pupils are equal, round, and reactive to light.  Cardiovascular:     Rate and Rhythm: Normal rate and regular rhythm.  Pulmonary:     Effort: Pulmonary effort is normal. No respiratory distress.     Breath sounds: Normal breath sounds. No wheezing, rhonchi or rales.  Musculoskeletal:     Cervical back: Neck supple. No tenderness.  Lymphadenopathy:     Cervical: No cervical adenopathy.  Skin:    Findings: No rash.  Neurological:     Mental Status: She is alert and oriented to person, place, and time. Mental status is at baseline.  Psychiatric:        Mood and Affect: Mood normal.        Behavior: Behavior normal.        Thought Content: Thought content normal.        Judgment: Judgment normal.     No results found. No results found. Results for orders placed or performed in visit on 12/01/22 (from the past 24 hour(s))  POC COVID-19 BinaxNow     Status: Normal   Collection Time: 12/01/22  4:06 PM  Result Value Ref Range   SARS Coronavirus 2 Ag Negative Negative  POCT Influenza A/B     Status: Normal   Collection Time: 12/01/22  4:59 PM  Result Value Ref Range   Influenza A, POC Negative Negative   Influenza B, POC Negative Negative    Assessment/Plan: Jeanne Davis is a 46 y.o. female present for OV for  Acute cough/bacterial respiratory infection - POC testing is negative for flu and  COVID Rest, hydrate.  Start flonase, mucinex (DM if cough), nettie pot or nasal saline.  Doxy twice daily prescribed, take  until completed.  Tessalon Perles for cough If cough present it can last up to 6-8 weeks.  F/U 2 weeks of not improved.    Reviewed expectations re: course of current medical issues. Discussed self-management of symptoms. Outlined signs and symptoms indicating need for more acute intervention. Patient verbalized understanding and all questions were answered. Patient received an After-Visit Summary.    Orders Placed This Encounter  Procedures   POC COVID-19 BinaxNow   POCT Influenza A/B   Meds ordered this encounter  Medications   fluticasone (FLONASE) 50 MCG/ACT nasal spray    Sig: Place 2 sprays into both nostrils daily.    Dispense:  16 g    Refill:  0   doxycycline (VIBRA-TABS) 100 MG tablet    Sig: Take 1 tablet (100 mg total) by mouth 2 (two) times daily.    Dispense:  20 tablet    Refill:  0   benzonatate (TESSALON) 200 MG capsule    Sig: Take 1 capsule (200 mg total) by mouth 2 (two) times daily as needed for cough.    Dispense:  20 capsule    Refill:  0   Referral Orders  No referral(s) requested today     Note is dictated utilizing voice recognition software. Although note has been proof read prior to signing, occasional typographical errors still can be missed. If any questions arise, please do not hesitate to call for verification.   electronically signed by:  Howard Pouch, DO  [place]

## 2022-12-23 ENCOUNTER — Other Ambulatory Visit: Payer: Self-pay | Admitting: Family Medicine

## 2022-12-23 DIAGNOSIS — J069 Acute upper respiratory infection, unspecified: Secondary | ICD-10-CM

## 2023-03-02 ENCOUNTER — Telehealth: Payer: Self-pay | Admitting: Family Medicine

## 2023-03-02 NOTE — Telephone Encounter (Signed)
Pt wants to know if she is due for a pap smear. Her physical was rescheduled for 03/21/23. Please call to advise whether she needs one.

## 2023-03-02 NOTE — Telephone Encounter (Signed)
No answer no vm.  Pt is due for pap.  Last pap was 02/26/20

## 2023-03-03 ENCOUNTER — Encounter: Payer: 59 | Admitting: Family Medicine

## 2023-03-21 ENCOUNTER — Other Ambulatory Visit (HOSPITAL_COMMUNITY)
Admission: RE | Admit: 2023-03-21 | Discharge: 2023-03-21 | Disposition: A | Discharge status: Home / Self Care | Payer: 59 | Source: Ambulatory Visit | Attending: Family Medicine | Admitting: Family Medicine

## 2023-03-21 ENCOUNTER — Ambulatory Visit (INDEPENDENT_AMBULATORY_CARE_PROVIDER_SITE_OTHER): Payer: 59 | Admitting: Family Medicine

## 2023-03-21 ENCOUNTER — Encounter: Payer: Self-pay | Admitting: Family Medicine

## 2023-03-21 VITALS — BP 120/70 | HR 81 | Temp 98.2°F | Resp 16 | Ht 68.0 in | Wt 152.0 lb

## 2023-03-21 DIAGNOSIS — E559 Vitamin D deficiency, unspecified: Secondary | ICD-10-CM | POA: Diagnosis not present

## 2023-03-21 DIAGNOSIS — Z1211 Encounter for screening for malignant neoplasm of colon: Secondary | ICD-10-CM | POA: Diagnosis not present

## 2023-03-21 DIAGNOSIS — Z0000 Encounter for general adult medical examination without abnormal findings: Secondary | ICD-10-CM | POA: Insufficient documentation

## 2023-03-21 NOTE — Progress Notes (Addendum)
Subjective:   By signing my name below, I, Jeanne Davis, attest that this documentation has been prepared under the direction and in the presence of Jeanne Held, DO. 03/21/2023   Patient ID: Jeanne Davis, female    DOB: [birth year], 47 y.o.   MRN: [id number]  Chief Complaint  Patient presents with   Annual Exam    Pt states not fasting     HPI Patient is in today for a comprehensive physical exam.   Mole She has a mole next to her left eye.   Colonoscopy She is overdue for a colonoscopy. She is interested in a referral during this visit.   Pap Smear Last pap smear completed on 02/26/20. 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 Repeat in 3 years. Pap smear received during this appointment.   Mammogram Last mammogram completed on 04/21/22. A possible asymmetry noted on the right breast MLO view disperses consistent with superimposed fibroglandular tissue, otherwise findings are normal. Repeat in 1 year.   Social history Her mother had a bowel blockage late last year, otherwise no changes in family history. No recent surgeries to report.   Immunizations She is UTD on the latest Covid-19 immunizations. She is UTD on the influenza immunization.   Vision She is UTD on vision. Her vision is 20/20. She uses reading glasses.  History reviewed. No pertinent past medical history.  Past Surgical History:  Procedure Laterality Date   WISDOM TOOTH EXTRACTION      Family History  Problem Relation Age of Onset   Hyperlipidemia Mother    Heart attack Mother    Heart attack Father    Coronary artery disease Father    Hyperlipidemia Father    Diabetes Father    Hypertension Father    Diabetes Sister        type I---half sister   Alcoholism Brother    Diabetes Maternal Grandmother    Heart disease Maternal Grandmother        CHF   Breast cancer Maternal Aunt    Cancer Maternal Aunt        breast ca   Heart attack Maternal Aunt    Cancer  Maternal Aunt        fallopian tubes   Cancer Maternal Uncle        tumor kidney, colon cancer   Cancer Paternal Aunt         breast   Breast cancer Paternal Aunt    Multiple sclerosis Cousin     Social History   Socioeconomic History   Marital status: Single    Spouse name: Not on file   Number of children: Not on file   Years of education: Not on file   Highest education level: Master's degree (e.g., MA, MS, MEng, MEd, MSW, MBA)  Occupational History   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[employer]  Tobacco Use   Smoking status: Never   Smokeless tobacco: Never  Vaping Use   Vaping Use: Never used  Substance and Sexual Activity   Alcohol use: No   Drug use: No   Sexual activity: Yes    Partners: Male    Birth control/protection: None  Other Topics Concern   Not on file  Social History Narrative   Exercise --- no   Social Determinants of Health   Financial Resource Strain: Low Risk  (12/01/2022)   Overall Financial Resource Strain (CARDIA)    Difficulty of Paying Living Expenses: Not hard at all  Food Insecurity: No Food Insecurity (12/01/2022)   Hunger Vital Sign    Worried About Running Out of Food in the Last Year: Never true    Ran Out of Food in the Last Year: Never true  Transportation Needs: No Transportation Needs (12/01/2022)   PRAPARE - Hydrologist (Medical): No    Lack of Transportation (Non-Medical): No  Physical Activity: Sufficiently Active (12/01/2022)   Exercise Vital Sign    Days of Exercise per Week: 7 days    Minutes of Exercise per Session: 30 min  Stress: No Stress Concern Present (12/01/2022)   [place]    Feeling of Stress : Not at all  Social Connections: Moderately Integrated (12/01/2022)   Social Connection and Isolation Panel [NHANES]    Frequency of Communication with Friends and Family: Three times a week    Frequency of Social Gatherings with  Friends and Family: Once a week    Attends Religious Services: More than 4 times per year    Active Member of [employer] or Organizations: Yes    Attends Music therapist: More than 4 times per year    Marital Status: Never married  Intimate Partner Violence: Not on file    Outpatient Medications Prior to Visit  Medication Sig Dispense Refill   fluticasone (FLONASE) 50 MCG/ACT nasal spray Place 2 sprays into both nostrils daily. 16 g 0   Norethindrone Acetate-Ethinyl Estradiol (LOESTRIN 1.5/30, 21,) 1.5-30 MG-MCG tablet Take 1 tablet by mouth daily. 28 tablet 11   Vitamin D, Ergocalciferol, (DRISDOL) 1.25 MG (50000 UNIT) CAPS capsule Take 1 capsule (50,000 Units total) by mouth every 7 (seven) days. 12 capsule 0   benzonatate (TESSALON) 200 MG capsule Take 1 capsule (200 mg total) by mouth 2 (two) times daily as needed for cough. (Patient not taking: Reported on 03/21/2023) 20 capsule 0   doxycycline (VIBRA-TABS) 100 MG tablet Take 1 tablet (100 mg total) by mouth 2 (two) times daily. (Patient not taking: Reported on 03/21/2023) 20 tablet 0   Lactic Ac-Citric Ac-Pot Bitart (PHEXXI) 1.8-1-0.4 % GEL Place 1 application vaginally daily. May use 2-3 times per week. (Patient not taking: Reported on 03/21/2023) 60 g 3   No facility-administered medications prior to visit.    No Known Allergies  Review of Systems  Constitutional:  Negative for fever and malaise/fatigue.  HENT:  Negative for congestion.   Eyes:  Negative for blurred vision.  Respiratory:  Negative for shortness of breath.   Cardiovascular:  Negative for chest pain, palpitations and leg swelling.  Gastrointestinal:  Negative for abdominal pain, blood in stool and nausea.  Genitourinary:  Negative for dysuria and frequency.  Musculoskeletal:  Negative for falls.  Skin:  Negative for rash.       (+) mole next to left eye  Neurological:  Negative for dizziness, loss of consciousness and headaches.  Endo/Heme/Allergies:   Negative for environmental allergies.  Psychiatric/Behavioral:  Negative for depression. The patient is not nervous/anxious.        Objective:    Physical Exam Vitals and nursing note reviewed.  Constitutional:      General: She is not in acute distress.    Appearance: Normal appearance. She is well-developed.  HENT:     Head: Normocephalic and atraumatic.     Right Ear: Tympanic membrane, ear canal and external ear normal.     Left Ear: Tympanic membrane, ear canal and external ear normal.  Nose: Nose normal.  Eyes:     Extraocular Movements: Extraocular movements intact.     Pupils: Pupils are equal, round, and reactive to light.  Neck:     Thyroid: No thyroid mass, thyromegaly or thyroid tenderness.     Vascular: No carotid bruit.  Cardiovascular:     Rate and Rhythm: Normal rate and regular rhythm.     Heart sounds: Normal heart sounds. No murmur heard.    No gallop.  Pulmonary:     Effort: Pulmonary effort is normal. No respiratory distress.     Breath sounds: Normal breath sounds. No wheezing or rales.  Chest:     Chest wall: No tenderness.  Breasts:    Right: Normal.     Left: Normal.  Genitourinary:    General: Normal vulva.     Vagina: No vaginal discharge.     Comments: Pelvic exam normal--- pap done   Musculoskeletal:        General: No tenderness, deformity or signs of injury. Normal range of motion.     Cervical back: Normal range of motion and neck supple.  Lymphadenopathy:     Cervical: No cervical adenopathy.  Skin:    General: Skin is warm.  Neurological:     Mental Status: She is alert and oriented to person, place, and time.  Psychiatric:        Behavior: Behavior normal.        Thought Content: Thought content normal.        Judgment: Judgment normal.     BP 120/70 (BP Location: Right Arm, Patient Position: Sitting, Cuff Size: Normal)   Pulse 81   Temp 98.2 F (36.8 C) (Oral)   Resp 16   Ht 5\' 8"  (1.727 m)   Wt 152 lb (68.9 kg)    LMP 03/01/2023   SpO2 99%   BMI 23.11 kg/m  Wt Readings from Last 3 Encounters:  03/21/23 152 lb (68.9 kg)  12/01/22 151 lb (68.5 kg)  03/01/22 145 lb 9.6 oz (66 kg)       Assessment & Plan:  Preventative health care -     Cytology - PAP -     CBC with Differential/Platelet -     Comprehensive metabolic panel -     Lipid panel -     TSH  Vitamin D deficiency -     VITAMIN D 25 Hydroxy (Vit-D Deficiency, Fractures)  Colon cancer screening -     Ambulatory referral to Gastroenterology    I, Jeanne Held, DO, personally preformed the services described in this documentation.  All medical record entries made by the scribe were at my direction and in my presence.  I have reviewed the chart and discharge instructions (if applicable) and agree that the record reflects my personal performance and is accurate and complete. 03/21/2023  Jeanne Held, DO   I,Kennedy C Lynn,acting as a scribe for Jeanne Held, DO.,have documented all relevant documentation on the behalf of Jeanne Held, DO,as directed by  Jeanne Held, DO while in the presence of Jeanne Held, DO.

## 2023-03-21 NOTE — Patient Instructions (Signed)
Preventive Care 40-47 Years Old, Female Preventive care refers to lifestyle choices and visits with your health care provider that can promote health and wellness. Preventive care visits are also called wellness exams. What can I expect for my preventive care visit? Counseling Your health care provider may ask you questions about your: Medical history, including: Past medical problems. Family medical history. Pregnancy history. Current health, including: Menstrual cycle. Method of birth control. Emotional well-being. Home life and relationship well-being. Sexual activity and sexual health. Lifestyle, including: Alcohol, nicotine or tobacco, and drug use. Access to firearms. Diet, exercise, and sleep habits. Work and work environment. Sunscreen use. Safety issues such as seatbelt and bike helmet use. Physical exam Your health care provider will check your: Height and weight. These may be used to calculate your BMI (body mass index). BMI is a measurement that tells if you are at a healthy weight. Waist circumference. This measures the distance around your waistline. This measurement also tells if you are at a healthy weight and may help predict your risk of certain diseases, such as type 2 diabetes and high blood pressure. Heart rate and blood pressure. Body temperature. Skin for abnormal spots. What immunizations do I need?  Vaccines are usually given at various ages, according to a schedule. Your health care provider will recommend vaccines for you based on your age, medical history, and lifestyle or other factors, such as travel or where you work. What tests do I need? Screening Your health care provider may recommend screening tests for certain conditions. This may include: Lipid and cholesterol levels. Diabetes screening. This is done by checking your blood sugar (glucose) after you have not eaten for a while (fasting). Pelvic exam and Pap test. Hepatitis B test. Hepatitis C  test. HIV (human immunodeficiency virus) test. STI (sexually transmitted infection) testing, if you are at risk. Lung cancer screening. Colorectal cancer screening. Mammogram. Talk with your health care provider about when you should start having regular mammograms. This may depend on whether you have a family history of breast cancer. BRCA-related cancer screening. This may be done if you have a family history of breast, ovarian, tubal, or peritoneal cancers. Bone density scan. This is done to screen for osteoporosis. Talk with your health care provider about your test results, treatment options, and if necessary, the need for more tests. Follow these instructions at home: Eating and drinking  Eat a diet that includes fresh fruits and vegetables, whole grains, lean protein, and low-fat dairy products. Take vitamin and mineral supplements as recommended by your health care provider. Do not drink alcohol if: Your health care provider tells you not to drink. You are pregnant, may be pregnant, or are planning to become pregnant. If you drink alcohol: Limit how much you have to 0-1 drink a day. Know how much alcohol is in your drink. In the [place] one drink equals one 12 oz bottle of beer (355 mL), one 5 oz glass of wine (148 mL), or one 1 oz glass of hard liquor (44 mL). Lifestyle Brush your teeth every morning and night with fluoride toothpaste. Floss one time each day. Exercise for at least 30 minutes 5 or more days each week. Do not use any products that contain nicotine or tobacco. These products include cigarettes, chewing tobacco, and vaping devices, such as e-cigarettes. If you need help quitting, ask your health care provider. Do not use drugs. If you are sexually active, practice safe sex. Use a condom or other form of protection to   prevent STIs. If you do not wish to become pregnant, use a form of birth control. If you plan to become pregnant, see your health care provider for a  prepregnancy visit. Take aspirin only as told by your health care provider. Make sure that you understand how much to take and what form to take. Work with your health care provider to find out whether it is safe and beneficial for you to take aspirin daily. Find healthy ways to manage stress, such as: Meditation, yoga, or listening to music. Journaling. Talking to a trusted person. Spending time with friends and family. Minimize exposure to UV radiation to reduce your risk of skin cancer. Safety Always wear your seat belt while driving or riding in a vehicle. Do not drive: If you have been drinking alcohol. Do not ride with someone who has been drinking. When you are tired or distracted. While texting. If you have been using any mind-altering substances or drugs. Wear a helmet and other protective equipment during sports activities. If you have firearms in your house, make sure you follow all gun safety procedures. Seek help if you have been physically or sexually abused. What's next? Visit your health care provider once a year for an annual wellness visit. Ask your health care provider how often you should have your eyes and teeth checked. Stay up to date on all vaccines. This information is not intended to replace advice given to you by your health care provider. Make sure you discuss any questions you have with your health care provider. Document Revised: 06/10/2021 Document Reviewed: 06/10/2021 Elsevier Patient Education  2023 Elsevier Inc.  

## 2023-03-21 NOTE — Assessment & Plan Note (Signed)
Ghm utd Check labs  See [facility] Health Maintenance  Topic Date Due   COLONOSCOPY (Pts 45-21yrs Insurance coverage will need to be confirmed)  Never done   PAP SMEAR-Modifier  02/26/2023   COVID-19 Vaccine (6 - 2023-24 season) 04/06/2023 (Originally 12/04/2022)   MAMMOGRAM  04/07/2023   DTaP/Tdap/Td (3 - Td or Tdap) 02/28/2031   INFLUENZA VACCINE  Completed   Hepatitis C Screening  Completed   HIV Screening  Completed   HPV VACCINES  Aged Out

## 2023-03-22 LAB — COMPREHENSIVE METABOLIC PANEL
ALT: 9 U/L (ref 0–35)
AST: 12 U/L (ref 0–37)
Albumin: 4.3 g/dL (ref 3.5–5.2)
Alkaline Phosphatase: 59 U/L (ref 39–117)
BUN: 14 mg/dL (ref 6–23)
CO2: 26 mEq/L (ref 19–32)
Calcium: 9.8 mg/dL (ref 8.4–10.5)
Chloride: 106 mEq/L (ref 96–112)
Creatinine, Ser: 0.77 mg/dL (ref 0.40–1.20)
GFR: 92.54 mL/min (ref >60.00)
Glucose, Bld: 126 mg/dL — ABNORMAL HIGH (ref 70–99)
Potassium: 4.1 mEq/L (ref 3.5–5.1)
Sodium: 139 mEq/L (ref 135–145)
Total Bilirubin: 0.3 mg/dL (ref 0.2–1.2)
Total Protein: 7.1 g/dL (ref 6.0–8.3)

## 2023-03-22 LAB — LIPID PANEL
Cholesterol: 243 mg/dL — ABNORMAL HIGH (ref 0–200)
HDL: 86.3 mg/dL (ref >39.00)
LDL Cholesterol: 122 mg/dL — ABNORMAL HIGH (ref 0–99)
NonHDL: 156.46
Total CHOL/HDL Ratio: 3
Triglycerides: 173 mg/dL — ABNORMAL HIGH (ref 0.0–149.0)
VLDL: 34.6 mg/dL (ref 0.0–40.0)

## 2023-03-22 LAB — CBC WITH DIFFERENTIAL/PLATELET
Basophils Absolute: 0.1 10*3/uL (ref 0.0–0.1)
Basophils Relative: 1.3 % (ref 0.0–3.0)
Eosinophils Absolute: 0.1 10*3/uL (ref 0.0–0.7)
Eosinophils Relative: 0.8 % (ref 0.0–5.0)
HCT: 37.5 % (ref 36.0–46.0)
Hemoglobin: 12.6 g/dL (ref 12.0–15.0)
Lymphocytes Relative: 30.7 % (ref 12.0–46.0)
Lymphs Abs: 1.9 10*3/uL (ref 0.7–4.0)
MCHC: 33.7 g/dL (ref 30.0–36.0)
MCV: 94.9 fl (ref 78.0–100.0)
Monocytes Absolute: 0.5 10*3/uL (ref 0.1–1.0)
Monocytes Relative: 7.3 % (ref 3.0–12.0)
Neutro Abs: 3.7 10*3/uL (ref 1.4–7.7)
Neutrophils Relative %: 59.9 % (ref 43.0–77.0)
Platelets: 385 10*3/uL (ref 150.0–400.0)
RBC: 3.95 Mil/uL (ref 3.87–5.11)
RDW: 13.5 % (ref 11.5–15.5)
WBC: 6.2 10*3/uL (ref 4.0–10.5)

## 2023-03-22 LAB — VITAMIN D 25 HYDROXY (VIT D DEFICIENCY, FRACTURES): VITD: 23.25 ng/mL — ABNORMAL LOW (ref 30.00–100.00)

## 2023-03-22 LAB — TSH: TSH: 0.7 u[IU]/mL (ref 0.35–5.50)

## 2023-03-23 ENCOUNTER — Other Ambulatory Visit: Payer: Self-pay | Admitting: Family Medicine

## 2023-03-23 DIAGNOSIS — R739 Hyperglycemia, unspecified: Secondary | ICD-10-CM

## 2023-03-23 DIAGNOSIS — E559 Vitamin D deficiency, unspecified: Secondary | ICD-10-CM

## 2023-03-23 DIAGNOSIS — E785 Hyperlipidemia, unspecified: Secondary | ICD-10-CM

## 2023-03-23 LAB — CYTOLOGY - PAP
Adequacy: ABSENT
Diagnosis: NEGATIVE

## 2023-03-24 ENCOUNTER — Other Ambulatory Visit: Payer: Self-pay | Admitting: *Deleted

## 2023-03-24 ENCOUNTER — Other Ambulatory Visit: Payer: Self-pay

## 2023-03-24 ENCOUNTER — Other Ambulatory Visit (INDEPENDENT_AMBULATORY_CARE_PROVIDER_SITE_OTHER): Payer: 59

## 2023-03-24 DIAGNOSIS — R739 Hyperglycemia, unspecified: Secondary | ICD-10-CM

## 2023-03-24 LAB — HEMOGLOBIN A1C: Hgb A1c MFr Bld: 5.4 % (ref 4.6–6.5)

## 2023-03-24 MED ORDER — VITAMIN D (ERGOCALCIFEROL) 1.25 MG (50000 UNIT) PO CAPS: 50000.0000 [IU] | ORAL_CAPSULE | ORAL | 0 refills | Status: DC

## 2023-04-26 ENCOUNTER — Ambulatory Visit (AMBULATORY_SURGERY_CENTER): Payer: 59 | Admitting: *Deleted

## 2023-04-26 ENCOUNTER — Encounter: Payer: Self-pay | Admitting: Gastroenterology

## 2023-04-26 VITALS — Ht 68.0 in | Wt 150.0 lb

## 2023-04-26 DIAGNOSIS — Z800 Family history of malignant neoplasm of digestive organs: Secondary | ICD-10-CM

## 2023-04-26 DIAGNOSIS — Z1211 Encounter for screening for malignant neoplasm of colon: Secondary | ICD-10-CM

## 2023-04-26 MED ORDER — NA SULFATE-K SULFATE-MG SULF 17.5-3.13-1.6 GM/177ML PO SOLN: 1.0000 | Freq: Once | ORAL | 0 refills | Status: AC

## 2023-04-26 NOTE — Progress Notes (Signed)

## 2023-05-09 ENCOUNTER — Other Ambulatory Visit: Payer: Self-pay | Admitting: Family Medicine

## 2023-05-09 DIAGNOSIS — Z1231 Encounter for screening mammogram for malignant neoplasm of breast: Secondary | ICD-10-CM

## 2023-05-10 ENCOUNTER — Encounter: Payer: Self-pay | Admitting: Gastroenterology

## 2023-05-10 ENCOUNTER — Ambulatory Visit (AMBULATORY_SURGERY_CENTER): Payer: 59 | Admitting: Gastroenterology

## 2023-05-10 VITALS — BP 94/62 | HR 66 | Temp 98.1°F | Resp 15 | Ht 68.0 in | Wt 150.0 lb

## 2023-05-10 DIAGNOSIS — K5730 Diverticulosis of large intestine without perforation or abscess without bleeding: Secondary | ICD-10-CM

## 2023-05-10 DIAGNOSIS — Z800 Family history of malignant neoplasm of digestive organs: Secondary | ICD-10-CM

## 2023-05-10 DIAGNOSIS — Z1211 Encounter for screening for malignant neoplasm of colon: Secondary | ICD-10-CM

## 2023-05-10 DIAGNOSIS — D123 Benign neoplasm of transverse colon: Secondary | ICD-10-CM

## 2023-05-10 DIAGNOSIS — K635 Polyp of colon: Secondary | ICD-10-CM | POA: Diagnosis not present

## 2023-05-10 MED ORDER — SODIUM CHLORIDE 0.9 % IV SOLN: 500.0000 mL | INTRAVENOUS | Status: DC

## 2023-05-10 NOTE — Patient Instructions (Signed)
YOU HAD AN ENDOSCOPIC PROCEDURE TODAY AT THE [facility]:   Refer to the procedure report that was given to you for any specific questions about what was found during the examination.  If the procedure report does not answer your questions, please call your gastroenterologist to clarify.  If you requested that your care partner not be given the details of your procedure findings, then the procedure report has been included in a sealed envelope for you to review at your convenience later.  YOU SHOULD EXPECT: Some feelings of bloating in the abdomen. Passage of more gas than usual.  Walking can help get rid of the air that was put into your GI tract during the procedure and reduce the bloating. If you had a lower endoscopy (such as a colonoscopy or flexible sigmoidoscopy) you may notice spotting of blood in your stool or on the toilet paper. If you underwent a bowel prep for your procedure, you may not have a normal bowel movement for a few days.  Please Note:  You might notice some irritation and congestion in your nose or some drainage.  This is from the oxygen used during your procedure.  There is no need for concern and it should clear up in a day or so.  SYMPTOMS TO REPORT IMMEDIATELY:  Following lower endoscopy (colonoscopy or flexible sigmoidoscopy):  Excessive amounts of blood in the stool  Significant tenderness or worsening of abdominal pains  Swelling of the abdomen that is new, acute  Fever of 100F or higher  For urgent or emergent issues, a gastroenterologist can be reached at any hour by calling [phone]. Do not use MyChart messaging for urgent concerns.    DIET:  We do recommend a small meal at first, but then you may proceed to your regular diet.  Drink plenty of fluids but you should avoid alcoholic beverages for 24 hours.  ACTIVITY:  You should plan to take it easy for the rest of today and you should NOT DRIVE or use heavy machinery until tomorrow (because of  the sedation medicines used during the test).    FOLLOW UP: Our staff will call the number listed on your records the next business day following your procedure.  We will call around 7:15- 8:00 am to check on you and address any questions or concerns that you may have regarding the information given to you following your procedure. If we do not reach you, we will leave a message.     If any biopsies were taken you will be contacted by phone or by letter within the next 1-3 weeks.  Please call us at [phone] if you have not heard about the biopsies in 3 weeks.    SIGNATURES/CONFIDENTIALITY: You and/or your care partner have signed paperwork which will be entered into your electronic medical record.  These signatures attest to the fact that that the information above on your After Visit Summary has been reviewed and is understood.  Full responsibility of the confidentiality of this discharge information lies with you and/or your care-partner.  

## 2023-05-10 NOTE — Progress Notes (Signed)
Report to PACU, RN, vss, BBS= Clear.  

## 2023-05-10 NOTE — Op Note (Signed)
[facility] Patient Name: Jeanne Davis Procedure Date: 05/10/2023 9:35 AM MRN: [id number] Endoscopist: Doristine Locks , MD, [id number] Age: 47 Referring MD:  Date of Birth: 12/26/1976 Gender: Female Account #: [id number] Procedure:                Colonoscopy Indications:              Screening for colon cancer: Family history of                            colorectal cancer in multiple 2nd degree relatives                            (maternal grandfather age >22 and maternal uncle                            age 69's-60's). This is the patient's first                            colonoscopy Medicines:                Monitored Anesthesia Care Procedure:                Pre-Anesthesia Assessment:                           - Prior to the procedure, a History and Physical                            was performed, and patient medications and                            allergies were reviewed. The patient's tolerance of                            previous anesthesia was also reviewed. The risks                            and benefits of the procedure and the sedation                            options and risks were discussed with the patient.                            All questions were answered, and informed consent                            was obtained. Prior Anticoagulants: The patient has                            taken no anticoagulant or antiplatelet agents. ASA                            Grade Assessment: I - A normal, healthy patient.  After reviewing the risks and benefits, the patient                            was deemed in satisfactory condition to undergo the                            procedure.                           After obtaining informed consent, the colonoscope                            was passed under direct vision. Throughout the                            procedure, the patient's blood pressure, pulse, and                             oxygen saturations were monitored continuously. The                            CF [id number] #[id number] was introduced through the anus                            and advanced to the the cecum, identified by                            appendiceal orifice and ileocecal valve. The                            colonoscopy was performed without difficulty. The                            patient tolerated the procedure well. The quality                            of the bowel preparation was excellent. The                            ileocecal valve, appendiceal orifice, and rectum                            were photographed. Scope In: 9:45:04 AM Scope Out: 9:56:38 AM Scope Withdrawal Time: 0 hours 8 minutes 43 seconds  Total Procedure Duration: 0 hours 11 minutes 34 seconds  Findings:                 The perianal and digital rectal examinations were                            normal.                           A 4 mm polyp was found in the transverse colon. The  polyp was sessile. The polyp was removed with a                            cold snare. Resection and retrieval were complete.                            Estimated blood loss was minimal.                           A few medium-mouthed diverticula were found in the                            transverse colon and ascending colon. The remainder                            of the colon was otherwise normal appearing.                           The retroflexed view of the distal rectum and anal                            verge was normal and showed no anal or rectal                            abnormalities. Complications:            No immediate complications. Estimated Blood Loss:     Estimated blood loss was minimal. Impression:               - One 4 mm polyp in the transverse colon, removed                            with a cold snare. Resected and retrieved.                           - Diverticulosis in the  transverse colon and in the                            ascending colon.                           - The distal rectum and anal verge are normal on                            retroflexion view. Recommendation:           - Patient has a contact number available for                            emergencies. The signs and symptoms of potential                            delayed complications were discussed with the  patient. Return to normal activities tomorrow.                            Written discharge instructions were provided to the                            patient.                           - Resume previous diet.                           - Continue present medications.                           - Await pathology results.                           - Repeat colonoscopy for surveillance based on                            pathology results.                           - Return to GI office PRN. Doristine Locks, MD 05/10/2023 10:06:07 AM

## 2023-05-10 NOTE — Progress Notes (Signed)
GASTROENTEROLOGY PROCEDURE H&P NOTE   Primary Care Physician: Donato Schultz, DO    Reason for Procedure:  Colon Cancer screening  Plan:    Colonoscopy  Patient is appropriate for endoscopic procedure(s) in the ambulatory ([facility]) setting.  The nature of the procedure, as well as the risks, benefits, and alternatives were carefully and thoroughly reviewed with the patient. Ample time for discussion and questions allowed. The patient understood, was satisfied, and agreed to proceed.     HPI: Jeanne Davis is a 47 y.o. female who presents for colonoscopy for routine Colon Cancer screening.  No active GI symptoms.  Fhx notable for MGF with colon cancer, maternal uncle with colon cancer. Patient is otherwise without complaints or active issues today.  Past Medical History:  Diagnosis Date   Allergy    SEASONAL   Anemia    Arthritis    KNEE   Hyperlipidemia     Past Surgical History:  Procedure Laterality Date   WISDOM TOOTH EXTRACTION      Prior to Admission medications   Medication Sig Start Date End Date Taking? Authorizing Provider  Norethindrone Acetate-Ethinyl Estradiol (LOESTRIN 1.5/30, 21,) 1.5-30 MG-MCG tablet Take 1 tablet by mouth daily. 01/20/22  Yes Willodean Rosenthal, MD  Vitamin D, Ergocalciferol, (DRISDOL) 1.25 MG (50000 UNIT) CAPS capsule Take 1 capsule (50,000 Units total) by mouth every 7 (seven) days. 03/24/23  Yes Seabron Spates R, DO  fluticasone (FLONASE) 50 MCG/ACT nasal spray Place 2 sprays into both nostrils daily. Patient taking differently: Place 2 sprays into both nostrils as needed. 12/01/22   Felix Pacini A, DO    Current Outpatient Medications  Medication Sig Dispense Refill   Norethindrone Acetate-Ethinyl Estradiol (LOESTRIN 1.5/30, 21,) 1.5-30 MG-MCG tablet Take 1 tablet by mouth daily. 28 tablet 11   Vitamin D, Ergocalciferol, (DRISDOL) 1.25 MG (50000 UNIT) CAPS capsule Take 1 capsule (50,000 Units total) by mouth every 7  (seven) days. 12 capsule 0   fluticasone (FLONASE) 50 MCG/ACT nasal spray Place 2 sprays into both nostrils daily. (Patient taking differently: Place 2 sprays into both nostrils as needed.) 16 g 0   Current Facility-Administered Medications  Medication Dose Route Frequency Provider Last Rate Last Admin   0.9 %  sodium chloride infusion  500 mL Intravenous Continuous Keighan Amezcua V, DO        Allergies as of 05/10/2023   (No Known Allergies)    Family History  Problem Relation Age of Onset   Hyperlipidemia Mother    Heart attack Mother    Heart attack Father    Coronary artery disease Father    Hyperlipidemia Father    Diabetes Father    Hypertension Father    Diabetes Sister        type I---half sister   Alcoholism Brother    Breast cancer Maternal Aunt    Cancer Maternal Aunt        breast ca   Heart attack Maternal Aunt    Cancer Maternal Aunt        fallopian tubes   Colon cancer Maternal Uncle        LATE 50'S-60'S   Cancer Maternal Uncle        tumor kidney, colon cancer   Cancer Paternal Aunt         breast   Breast cancer Paternal Aunt    Diabetes Maternal Grandmother    Heart disease Maternal Grandmother        CHF  Colon cancer Maternal Grandfather    Multiple sclerosis Cousin    Colon polyps Neg Hx    Crohn's disease Neg Hx    Esophageal cancer Neg Hx    Rectal cancer Neg Hx    Stomach cancer Neg Hx    Ulcerative colitis Neg Hx     Social History   Socioeconomic History   Marital status: Single    Spouse name: Not on file   Number of children: Not on file   Years of education: Not on file   Highest education level: Master's degree (e.g., MA, MS, MEng, MEd, MSW, MBA)  Occupational History   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[employer] [employer]  Tobacco Use   Smoking status: Never   Smokeless tobacco: Never  Vaping Use   Vaping Use: Never used  Substance and Sexual Activity   Alcohol use: No   Drug use: No   Sexual activity: Yes    Partners: Male     Birth control/protection: None  Other Topics Concern   Not on file  Social History Narrative   Exercise --- no   Social Determinants of Health   Financial Resource Strain: Low Risk  (12/01/2022)   Overall Financial Resource Strain (CARDIA)    Difficulty of Paying Living Expenses: Not hard at all  Food Insecurity: No Food Insecurity (12/01/2022)   Hunger Vital Sign    Worried About Running Out of Food in the Last Year: Never true    Ran Out of Food in the Last Year: Never true  Transportation Needs: No Transportation Needs (12/01/2022)   PRAPARE - Administrator, Civil Service (Medical): No    Lack of Transportation (Non-Medical): No  Physical Activity: Sufficiently Active (12/01/2022)   Exercise Vital Sign    Days of Exercise per Week: 7 days    Minutes of Exercise per Session: 30 min  Stress: No Stress Concern Present (12/01/2022)   [employer] of Occupational Health - Occupational Stress Questionnaire    Feeling of Stress : Not at all  Social Connections: Moderately Integrated (12/01/2022)   Social Connection and Isolation Panel [NHANES]    Frequency of Communication with Friends and Family: Three times a week    Frequency of Social Gatherings with Friends and Family: Once a week    Attends Religious Services: More than 4 times per year    Active Member of [employer] or Organizations: Yes    Attends Engineer, structural: More than 4 times per year    Marital Status: Never married  Intimate Partner Violence: Not on file    Physical Exam: Vital signs in last 24 hours: @BP  107/62   Pulse (!) 59   Temp 98.1 F (36.7 C) (Skin)   Resp 15   Ht 5\' 8"  (1.727 m)   Wt 150 lb (68 kg)   LMP 04/26/2023 (Exact Date)   SpO2 100%   BMI 22.81 kg/m  GEN: NAD EYE: Sclerae anicteric ENT: MMM CV: Non-tachycardic Pulm: CTA b/l GI: Soft, NT/ND NEURO:  Alert & Oriented x 3   Doristine Locks, DO [facility]   05/10/2023 9:38 AM

## 2023-05-10 NOTE — Progress Notes (Signed)
Pt. states no medical or surgical changes since previsit or office visit. 

## 2023-05-10 NOTE — Progress Notes (Signed)
Called to room to assist during endoscopic procedure.  Patient ID and intended procedure confirmed with present staff. Received instructions for my participation in the procedure from the performing physician.  

## 2023-05-11 ENCOUNTER — Telehealth: Payer: Self-pay | Admitting: *Deleted

## 2023-05-11 NOTE — Telephone Encounter (Signed)
  Follow up Call-     05/10/2023    8:46 AM  Call back number  Post procedure Call Back phone  # [phone]  Permission to leave phone message Yes     Patient questions:  Do you have a fever, pain , or abdominal swelling? No. Pain Score  0 *  Have you tolerated food without any problems? Yes.    Have you been able to return to your normal activities? Yes.    Do you have any questions about your discharge instructions: Diet   No. Medications  No. Follow up visit  No.  Do you have questions or concerns about your Care? No.  Actions: * If pain score is 4 or above: No action needed, pain <4.

## 2023-05-19 ENCOUNTER — Ambulatory Visit: Payer: 59

## 2023-06-09 ENCOUNTER — Ambulatory Visit
Admission: RE | Admit: 2023-06-09 | Discharge: 2023-06-09 | Disposition: A | Discharge status: Home / Self Care | Payer: 59 | Source: Ambulatory Visit | Attending: Family Medicine | Admitting: Family Medicine

## 2023-06-09 DIAGNOSIS — Z1231 Encounter for screening mammogram for malignant neoplasm of breast: Secondary | ICD-10-CM

## 2023-07-11 ENCOUNTER — Other Ambulatory Visit: Payer: Self-pay

## 2023-07-11 ENCOUNTER — Encounter: Payer: Self-pay | Admitting: Obstetrics & Gynecology

## 2023-07-11 DIAGNOSIS — N921 Excessive and frequent menstruation with irregular cycle: Secondary | ICD-10-CM

## 2023-07-11 MED ORDER — NORETHINDRONE ACET-ETHINYL EST 1.5-30 MG-MCG PO TABS
1.0000 | ORAL_TABLET | Freq: Every day | ORAL | 1 refills | Status: DC
Start: 2023-07-11 — End: 2023-08-10

## 2023-08-10 ENCOUNTER — Encounter: Payer: Self-pay | Admitting: Obstetrics & Gynecology

## 2023-08-10 ENCOUNTER — Ambulatory Visit (INDEPENDENT_AMBULATORY_CARE_PROVIDER_SITE_OTHER): Payer: 59 | Admitting: Obstetrics & Gynecology

## 2023-08-10 VITALS — BP 108/72 | HR 82 | Ht 68.0 in | Wt 154.0 lb

## 2023-08-10 DIAGNOSIS — N921 Excessive and frequent menstruation with irregular cycle: Secondary | ICD-10-CM

## 2023-08-10 DIAGNOSIS — Z3009 Encounter for other general counseling and advice on contraception: Secondary | ICD-10-CM | POA: Diagnosis not present

## 2023-08-10 MED ORDER — MISOPROSTOL 200 MCG PO TABS
ORAL_TABLET | ORAL | 0 refills | Status: DC
Start: 2023-08-10 — End: 2024-08-20

## 2023-08-10 MED ORDER — NORETHINDRONE ACET-ETHINYL EST 1.5-30 MG-MCG PO TABS
1.0000 | ORAL_TABLET | Freq: Every day | ORAL | 12 refills | Status: DC
Start: 2023-08-10 — End: 2023-10-05

## 2023-08-10 NOTE — Patient Instructions (Signed)

## 2023-08-10 NOTE — Progress Notes (Signed)
History:  47 y.o. G0P0000 here today for conversation re contraception.   The following portions of the patient's history were reviewed and updated as appropriate: allergies, current medications, past family history, past medical history, past social history, past surgical history and problem list.  Review of Systems:  Pertinent items are noted in HPI.    Objective:  Physical Exam Blood pressure 108/72, pulse 82, height 5\' 8"  (1.727 m), weight 154 lb (69.9 kg), last menstrual period 07/19/2023.  CONSTITUTIONAL: Well-developed, well-nourished female in no acute distress.  HENT:  Normocephalic, atraumatic EYES: Conjunctivae and EOM are normal. No scleral icterus.  NECK: Normal range of motion SKIN: Skin is warm and dry. No rash noted. Not diaphoretic.No pallor. NEUROLGIC: Alert and oriented to person, place, and time. Normal coordination.   Assessment & Plan:  Reviewed all forms of birth control options available including abstinence; over the counter/barrier methods; hormonal contraceptive medication including pill, patch, ring, injection,contraceptive implant; hormonal and nonhormonal IUDs; permanent sterilization options including vasectomy and the various tubal sterilization modalities. Risks and benefits reviewed.  Questions were answered.  Information was given to patient to review.    Pt want refillls on OCPs. She will consider the LnIUD to remove the risks assoc with EES. She will call to scheudle after review of the info given.   Diagnoses and all orders for this visit:  Encounter for counseling regarding contraception -     misoprostol (CYTOTEC) 200 MCG tablet; Place two tablets in the vagina the night prior to your next clinic appointment -     Norethindrone Acetate-Ethinyl Estradiol (LOESTRIN 1.5/30, 21,) 1.5-30 MG-MCG tablet; Take 1 tablet by mouth daily.  Breakthrough bleeding on birth control pills -     Norethindrone Acetate-Ethinyl Estradiol (LOESTRIN 1.5/30, 21,) 1.5-30  MG-MCG tablet; Take 1 tablet by mouth daily.   F/u for IUD insertion. Pt will call to schedule.   Jeremaine Maraj L. Harraway-Smith, M.D., [facility]

## 2023-08-31 ENCOUNTER — Encounter: Payer: Self-pay | Admitting: Obstetrics & Gynecology

## 2023-09-01 ENCOUNTER — Telehealth: Payer: Self-pay | Admitting: Family Medicine

## 2023-09-01 NOTE — Telephone Encounter (Signed)
Pt dropped off document to be filled out by provider (Health & Wellness 2 pages) Pt is needing document faxed when ready to [phone]. Document put at front office tray under providers name.

## 2023-09-02 NOTE — Telephone Encounter (Signed)
Received

## 2023-09-05 NOTE — Telephone Encounter (Signed)
Form completed and faxed. 

## 2023-09-14 ENCOUNTER — Ambulatory Visit: Payer: 59 | Admitting: Obstetrics & Gynecology

## 2023-09-20 NOTE — Progress Notes (Signed)
Documented patient's most recent Flu and COVID vaccines. Given at [facility]. Jeanne Davis l Yovani Cogburn, CMA

## 2023-10-05 ENCOUNTER — Other Ambulatory Visit: Payer: Self-pay

## 2023-10-05 ENCOUNTER — Encounter: Payer: Self-pay | Admitting: Medical

## 2023-10-05 ENCOUNTER — Ambulatory Visit: Payer: 59 | Admitting: Medical

## 2023-10-05 VITALS — BP 122/67 | HR 80 | Wt 155.0 lb

## 2023-10-05 DIAGNOSIS — Z30430 Encounter for insertion of intrauterine contraceptive device: Secondary | ICD-10-CM

## 2023-10-05 DIAGNOSIS — Z3202 Encounter for pregnancy test, result negative: Secondary | ICD-10-CM

## 2023-10-05 LAB — POCT URINE PREGNANCY: Preg Test, Ur: NEGATIVE

## 2023-10-05 MED ORDER — LEVONORGESTREL 20.1 MCG/DAY IU IUD
1.0000 | INTRAUTERINE_SYSTEM | Freq: Once | INTRAUTERINE | Status: AC
Start: 2023-10-05 — End: 2023-10-05
  Administered 2023-10-05: 1 via INTRAUTERINE

## 2023-10-05 NOTE — Progress Notes (Signed)
    GYNECOLOGY CLINIC PROCEDURE NOTE  Ms. Jeanne Davis is a 47 y.o. G0P0000 here for [place] IUD insertion. No GYN concerns.  Last pap smear was on 03/21/23 and was normal.  IUD Insertion Procedure Note Patient identified, informed consent performed.  Discussed risks of irregular bleeding, cramping, infection, malpositioning or misplacement of the IUD outside the uterus which may require further procedure such as laparoscopy. Time out was performed.  Urine pregnancy test negative.  Speculum placed in the vagina.  Cervix visualized.  Cleaned with Betadine x 2.  Grasped anteriorly with a single tooth tenaculum.  Uterus sounded to 8 cm.  Liletta IUD placed per manufacturer's recommendations.  Strings trimmed to 3 cm. Tenaculum was removed, good hemostasis noted.  Patient tolerated procedure well.   Patient was given post-procedure instructions.  She was advised to be have backup contraception for one week.  Patient was also asked to check IUD strings periodically and follow up in 4 weeks for IUD check.  Marny Lowenstein, PA-C 10/05/2023 3:32 PM

## 2023-11-02 ENCOUNTER — Ambulatory Visit: Payer: 59 | Admitting: Obstetrics & Gynecology

## 2024-07-01 ENCOUNTER — Telehealth: Admitting: Emergency Medicine

## 2024-07-01 DIAGNOSIS — J069 Acute upper respiratory infection, unspecified: Secondary | ICD-10-CM | POA: Diagnosis not present

## 2024-07-01 MED ORDER — BENZONATATE 100 MG PO CAPS: 100.0000 mg | ORAL_CAPSULE | Freq: Two times a day (BID) | ORAL | 0 refills | Status: AC | PRN

## 2024-07-01 MED ORDER — FLUTICASONE PROPIONATE 50 MCG/ACT NA SUSP
2.0000 | Freq: Every day | NASAL | 0 refills | Status: AC
Start: 2024-07-01 — End: ?

## 2024-07-01 MED ORDER — AZELASTINE HCL 0.1 % NA SOLN: 2.0000 | Freq: Two times a day (BID) | NASAL | 0 refills | Status: AC

## 2024-07-01 NOTE — Patient Instructions (Addendum)
  Jeanne Davis, thank you for joining Jon CHRISTELLA Belt, NP for today's virtual visit.  While this provider is not your primary care provider (PCP), if your PCP is located in our provider database this encounter information will be shared with them immediately following your visit.   A [facility] MyChart account gives you access to today's visit and all your visits, tests, and labs performed at [facility]  click here if you don't have a [facility] MyChart account or go to [email or web address]  Consent: (Patient) Jeanne Davis provided verbal consent for this virtual visit at the beginning of the encounter.  Current Medications:  Current Outpatient Medications:    fluticasone  (FLONASE ) 50 MCG/ACT nasal spray, Place 2 sprays into both nostrils daily. (Patient not taking: Reported on 08/10/2023), Disp: 16 g, Rfl: 0   misoprostol  (CYTOTEC ) 200 MCG tablet, Place two tablets in the vagina the night prior to your next clinic appointment (Patient not taking: Reported on 10/05/2023), Disp: 2 tablet, Rfl: 0   Vitamin D , Ergocalciferol , (DRISDOL ) 1.25 MG (50000 UNIT) CAPS capsule, Take 1 capsule (50,000 Units total) by mouth every 7 (seven) days., Disp: 12 capsule, Rfl: 0   Medications ordered in this encounter:  No orders of the defined types were placed in this encounter.    *If you need refills on other medications prior to your next appointment, please contact your pharmacy*  Follow-Up: Call back or seek an in-person evaluation if the symptoms worsen or if the condition fails to improve as anticipated.  [facility] [phone]  Other Instructions  I have prescribed Flonase  plus another nose spray for you to use while sick. I have also prescribed a cough medicine.   I do recommend using plain mucinex or plain robitussin (no DM while taking prescription cough medicine) to help thin out your congestion and help it drain.  I also recommend using saline spray to  help your congestion drain.  Use the saline spray at a different time of the day than the prescription no sprays so the saline does not wash them out.   If you have been instructed to have an in-person evaluation today at a local Urgent Care facility, please use the link below. It will take you to a list of all of our available [facility], including address, phone number and hours of operation. Please do not delay care.  [facility]  If you or a family member do not have a primary care provider, use the link below to schedule a visit and establish care. When you choose a [facility] primary care physician or advanced practice provider, you gain a long-term partner in health. Find a Primary Care Provider  Learn more about [facility] in-office and virtual care options: [facility] - Get Care Now

## 2024-07-01 NOTE — Progress Notes (Signed)
 Virtual Visit Consent   Jeanne Davis, you are scheduled for a virtual visit with a [facility] provider today. Just as with appointments in the office, your consent must be obtained to participate. Your consent will be active for this visit and any virtual visit you may have with one of our providers in the next 365 days. If you have a MyChart account, a copy of this consent can be sent to you electronically.  As this is a virtual visit, video technology does not allow for your provider to perform a traditional examination. This may limit your provider's ability to fully assess your condition. If your provider identifies any concerns that need to be evaluated in person or the need to arrange testing (such as labs, EKG, etc.), we will make arrangements to do so. Although advances in technology are sophisticated, we cannot ensure that it will always work on either your end or our end. If the connection with a video visit is poor, the visit may have to be switched to a telephone visit. With either a video or telephone visit, we are not always able to ensure that we have a secure connection.  By engaging in this virtual visit, you consent to the provision of healthcare and authorize for your insurance to be billed (if applicable) for the services provided during this visit. Depending on your insurance coverage, you may receive a charge related to this service.  I need to obtain your verbal consent now. Are you willing to proceed with your visit today? [name] has provided verbal consent on 07/01/2024 for a virtual visit (video or telephone). Jon CHRISTELLA Belt, NP  Date: 07/01/2024 12:03 PM   Virtual Visit via Video Note   I, Jon CHRISTELLA Belt, connected with  Jeanne RIESTER  ([id number], 09-05-1976) on 07/01/24 at 12:00 PM EDT by a video-enabled telemedicine application and verified that I am speaking with the correct person using two identifiers.  Location: Patient: Virtual Visit Location Patient:  Home Provider: Virtual Visit Location Provider: Home Office   I discussed the limitations of evaluation and management by telemedicine and the availability of in person appointments. The patient expressed understanding and agreed to proceed.    History of Present Illness: Jeanne Davis is a 48 y.o. who identifies as a female who was assigned female at birth, and is being seen today for cold symptoms.  Nasal congestion and drainage, coughing up mucus - mucus is clear. All started yesterday. No SOB. Has drainage down throat, tickle in her throat causing cough  Treating at home with tylenol for aches, no fever. Also taking Flonase  (but it is expired), and took mucinex once yesterday, took robitussin DM a hour ago. Doesn't think any of it is helping dry up congestion.   Covid test at home negative.   HPI: HPI  Problems:  Patient Active Problem List   Diagnosis Date Noted   Chronic right shoulder pain 03/01/2022   Patellofemoral arthritis of right knee 09/01/2021   Degenerative joint disease of knee, left 07/21/2021   Pain of right thumb 07/17/2021   Vitamin D  deficiency 06/08/2021   Positive ANA (antinuclear antibody) 06/08/2021   Effusion of right knee 05/05/2021   Dysmenorrhea 02/27/2021   History of anemia 02/27/2021   Effusion of left knee 01/22/2021   Preventative health care 12/08/2017   Tinea versicolor 09/17/2013   Lipoma of back 09/17/2013   VAGINAL PRURITUS 05/15/2010   HEMATURIA, HX OF 05/15/2010   Benign neoplasm of skin  01/23/2009   OTHER SPECIFIED DISORDER OF SKIN 12/24/2008   Vaginitis and vulvovaginitis 07/20/2007   URINALYSIS, ABNORMAL 07/20/2007    Allergies: No Known Allergies Medications:  Current Outpatient Medications:    fluticasone  (FLONASE ) 50 MCG/ACT nasal spray, Place 2 sprays into both nostrils daily. (Patient not taking: Reported on 08/10/2023), Disp: 16 g, Rfl: 0   misoprostol  (CYTOTEC ) 200 MCG tablet, Place two tablets in the vagina the night  prior to your next clinic appointment (Patient not taking: Reported on 10/05/2023), Disp: 2 tablet, Rfl: 0   Vitamin D , Ergocalciferol , (DRISDOL ) 1.25 MG (50000 UNIT) CAPS capsule, Take 1 capsule (50,000 Units total) by mouth every 7 (seven) days., Disp: 12 capsule, Rfl: 0  Observations/Objective: Patient is well-developed, well-nourished in no acute distress.  Resting comfortably  at home.  Head is normocephalic, atraumatic.  No labored breathing.  Speech is clear and coherent with logical content.  Patient is alert and oriented at baseline.  Does sound congested. Is clearing throat often, occasional cough.   Assessment and Plan: 1. Upper respiratory tract infection, unspecified type  Discussed supportive care  Follow Up Instructions: I discussed the assessment and treatment plan with the patient. The patient was provided an opportunity to ask questions and all were answered. The patient agreed with the plan and demonstrated an understanding of the instructions.  A copy of instructions were sent to the patient via MyChart unless otherwise noted below.   The patient was advised to call back or seek an in-person evaluation if the symptoms worsen or if the condition fails to improve as anticipated.    Jon CHRISTELLA Belt, NP

## 2024-07-23 ENCOUNTER — Other Ambulatory Visit: Payer: Self-pay | Admitting: Emergency Medicine

## 2024-07-23 DIAGNOSIS — J069 Acute upper respiratory infection, unspecified: Secondary | ICD-10-CM

## 2024-08-09 ENCOUNTER — Other Ambulatory Visit: Payer: Self-pay | Admitting: Family Medicine

## 2024-08-09 DIAGNOSIS — Z1231 Encounter for screening mammogram for malignant neoplasm of breast: Secondary | ICD-10-CM

## 2024-08-20 ENCOUNTER — Ambulatory Visit (INDEPENDENT_AMBULATORY_CARE_PROVIDER_SITE_OTHER): Admitting: Family Medicine

## 2024-08-20 ENCOUNTER — Encounter: Payer: Self-pay | Admitting: Family Medicine

## 2024-08-20 VITALS — BP 106/60 | HR 66 | Temp 98.2°F | Resp 16 | Ht 68.0 in | Wt 160.6 lb

## 2024-08-20 DIAGNOSIS — R739 Hyperglycemia, unspecified: Secondary | ICD-10-CM | POA: Insufficient documentation

## 2024-08-20 DIAGNOSIS — Z0000 Encounter for general adult medical examination without abnormal findings: Secondary | ICD-10-CM | POA: Diagnosis not present

## 2024-08-20 DIAGNOSIS — E785 Hyperlipidemia, unspecified: Secondary | ICD-10-CM | POA: Diagnosis not present

## 2024-08-20 DIAGNOSIS — E559 Vitamin D deficiency, unspecified: Secondary | ICD-10-CM | POA: Diagnosis not present

## 2024-08-20 NOTE — Patient Instructions (Signed)
 Preventive Care 58-48 Years Old, Female  Preventive care refers to lifestyle choices and visits with your health care provider that can promote health and wellness. Preventive care visits are also called wellness exams.  What can I expect for my preventive care visit?  Counseling  Your health care provider may ask you questions about your:  Medical history, including:  Past medical problems.  Family medical history.  Pregnancy history.  Current health, including:  Menstrual cycle.  Method of birth control.  Emotional well-being.  Home life and relationship well-being.  Sexual activity and sexual health.  Lifestyle, including:  Alcohol, nicotine or tobacco, and drug use.  Access to firearms.  Diet, exercise, and sleep habits.  Work and work Astronomer.  Sunscreen use.  Safety issues such as seatbelt and bike helmet use.  Physical exam  Your health care provider will check your:  Height and weight. These may be used to calculate your BMI (body mass index). BMI is a measurement that tells if you are at a healthy weight.  Waist circumference. This measures the distance around your waistline. This measurement also tells if you are at a healthy weight and may help predict your risk of certain diseases, such as type 2 diabetes and high blood pressure.  Heart rate and blood pressure.  Body temperature.  Skin for abnormal spots.  What immunizations do I need?    Vaccines are usually given at various ages, according to a schedule. Your health care provider will recommend vaccines for you based on your age, medical history, and lifestyle or other factors, such as travel or where you work.  What tests do I need?  Screening  Your health care provider may recommend screening tests for certain conditions. This may include:  Lipid and cholesterol levels.  Diabetes screening. This is done by checking your blood sugar (glucose) after you have not eaten for a while (fasting).  Pelvic exam and Pap test.  Hepatitis B test.  Hepatitis C  test.  HIV (human immunodeficiency virus) test.  STI (sexually transmitted infection) testing, if you are at risk.  Lung cancer screening.  Colorectal cancer screening.  Mammogram. Talk with your health care provider about when you should start having regular mammograms. This may depend on whether you have a family history of breast cancer.  BRCA-related cancer screening. This may be done if you have a family history of breast, ovarian, tubal, or peritoneal cancers.  Bone density scan. This is done to screen for osteoporosis.  Talk with your health care provider about your test results, treatment options, and if necessary, the need for more tests.  Follow these instructions at home:  Eating and drinking    Eat a diet that includes fresh fruits and vegetables, whole grains, lean protein, and low-fat dairy products.  Take vitamin and mineral supplements as recommended by your health care provider.  Do not drink alcohol if:  Your health care provider tells you not to drink.  You are pregnant, may be pregnant, or are planning to become pregnant.  If you drink alcohol:  Limit how much you have to 0-1 drink a day.  Know how much alcohol is in your drink. In the [place] one drink equals one 12 oz bottle of beer (355 mL), one 5 oz glass of wine (148 mL), or one 1 oz glass of hard liquor (44 mL).  Lifestyle  Brush your teeth every morning and night with fluoride toothpaste. Floss one time each day.  Exercise for at least  30 minutes 5 or more days each week.  Do not use any products that contain nicotine or tobacco. These products include cigarettes, chewing tobacco, and vaping devices, such as e-cigarettes. If you need help quitting, ask your health care provider.  Do not use drugs.  If you are sexually active, practice safe sex. Use a condom or other form of protection to prevent STIs.  If you do not wish to become pregnant, use a form of birth control. If you plan to become pregnant, see your health care provider for a  prepregnancy visit.  Take aspirin only as told by your health care provider. Make sure that you understand how much to take and what form to take. Work with your health care provider to find out whether it is safe and beneficial for you to take aspirin daily.  Find healthy ways to manage stress, such as:  Meditation, yoga, or listening to music.  Journaling.  Talking to a trusted person.  Spending time with friends and family.  Minimize exposure to UV radiation to reduce your risk of skin cancer.  Safety  Always wear your seat belt while driving or riding in a vehicle.  Do not drive:  If you have been drinking alcohol. Do not ride with someone who has been drinking.  When you are tired or distracted.  While texting.  If you have been using any mind-altering substances or drugs.  Wear a helmet and other protective equipment during sports activities.  If you have firearms in your house, make sure you follow all gun safety procedures.  Seek help if you have been physically or sexually abused.  What's next?  Visit your health care provider once a year for an annual wellness visit.  Ask your health care provider how often you should have your eyes and teeth checked.  Stay up to date on all vaccines.  This information is not intended to replace advice given to you by your health care provider. Make sure you discuss any questions you have with your health care provider.  Document Revised: 06/10/2021 Document Reviewed: 06/10/2021  Elsevier Patient Education  2024 [employer].

## 2024-08-20 NOTE — Progress Notes (Signed)
 Subjective:    Patient ID: Wende LULLA Lame, female    DOB: 07/21/1976, 48 y.o.   MRN: [id number]  Chief Complaint  Patient presents with   Annual Exam    Pt states not fasting     HPI Patient is in today for cpe.  Discussed the use of AI scribe software for clinical note transcription with the patient, who gave verbal consent to proceed.  History of Present Illness DEARDRA HINKLEY is a 48 year old female who presents for a physical exam required by her employer.  She is attempting to upload the required form to MyChart for printing. She inquires about receiving a flu shot and COVID-19 vaccination, noting she previously received them at [facility]. She reports being up to date with most health screenings and vaccinations, except for an eye exam this year. She recently visited the dentist for a cleaning and uses readers for computer work without current eye issues.  She has a history of knee issues that required injections a few years ago but is currently doing well. She walks daily with her dog for exercise and denies current joint issues, stating her knees are doing well.  She experienced a recent respiratory illness resembling bronchitis, which resolved after a couple of weeks. No current chest pain or shortness of breath.  Her mother underwent open heart surgery with a triple bypass in March after experiencing chest tightness and undergoing a stress test. Her mother is currently doing well post-rehabilitation. Her father has hearing loss and recurrent back issues after surgery a year ago.    Past Medical History:  Diagnosis Date   Allergy    SEASONAL   Anemia    Arthritis    KNEE   Hyperlipidemia     Past Surgical History:  Procedure Laterality Date   WISDOM TOOTH EXTRACTION      Family History  Problem Relation Age of Onset   Coronary artery disease Mother    Hyperlipidemia Mother    Heart attack Mother    Heart attack Father    Coronary artery disease Father     Hyperlipidemia Father    Diabetes Father    Hypertension Father    Diabetes Sister        type I---half sister   Alcoholism Brother    Diabetes Maternal Grandmother    Heart disease Maternal Grandmother        CHF   Colon cancer Maternal Grandfather    Breast cancer Maternal Aunt    Cancer Maternal Aunt        breast ca   Heart attack Maternal Aunt    Cancer Maternal Aunt        fallopian tubes   Colon cancer Maternal Uncle        LATE 50'S-60'S   Cancer Maternal Uncle        tumor kidney, colon cancer   Cancer Paternal Aunt         breast   Breast cancer Paternal Aunt    Multiple sclerosis Cousin    Colon polyps Neg Hx    Crohn's disease Neg Hx    Esophageal cancer Neg Hx    Rectal cancer Neg Hx    Stomach cancer Neg Hx    Ulcerative colitis Neg Hx     Social History   Socioeconomic History   Marital status: Single    Spouse name: Not on file   Number of children: Not on file   Years of education: Not on  file   Highest education level: Master's degree (e.g., MA, MS, MEng, MEd, MSW, MBA)  Occupational History   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[place]  Tobacco Use   Smoking status: Never   Smokeless tobacco: Never  Vaping Use   Vaping status: Never Used  Substance and Sexual Activity   Alcohol use: No   Drug use: No   Sexual activity: Yes    Partners: Male    Birth control/protection: None  Other Topics Concern   Not on file  Social History Narrative   Exercise --- no   Social Drivers of Health   Financial Resource Strain: Low Risk  (12/01/2022)   Overall Financial Resource Strain (CARDIA)    Difficulty of Paying Living Expenses: Not hard at all  Food Insecurity: No Food Insecurity (12/01/2022)   Hunger Vital Sign    Worried About Running Out of Food in the Last Year: Never true    Ran Out of Food in the Last Year: Never true  Transportation Needs: No Transportation Needs (12/01/2022)   PRAPARE - Administrator, Civil Service (Medical): No     Lack of Transportation (Non-Medical): No  Physical Activity: Sufficiently Active (12/01/2022)   Exercise Vital Sign    Days of Exercise per Week: 7 days    Minutes of Exercise per Session: 30 min  Stress: No Stress Concern Present (12/01/2022)   [employer] of Occupational Health - Occupational Stress Questionnaire    Feeling of Stress : Not at all  Social Connections: Moderately Integrated (12/01/2022)   Social Connection and Isolation Panel    Frequency of Communication with Friends and Family: Three times a week    Frequency of Social Gatherings with Friends and Family: Once a week    Attends Religious Services: More than 4 times per year    Active Member of Clubs or Organizations: Yes    Attends Engineer, structural: More than 4 times per year    Marital Status: Never married  Intimate Partner Violence: Not on file    Outpatient Medications Prior to Visit  Medication Sig Dispense Refill   azelastine  (ASTELIN ) 0.1 % nasal spray Place 2 sprays into both nostrils 2 (two) times daily. Use in each nostril as directed 30 mL 0   fluticasone  (FLONASE ) 50 MCG/ACT nasal spray Place 2 sprays into both nostrils daily. 16 g 0   Vitamin D , Ergocalciferol , (DRISDOL ) 1.25 MG (50000 UNIT) CAPS capsule Take 1 capsule (50,000 Units total) by mouth every 7 (seven) days. 12 capsule 0   benzonatate  (TESSALON ) 100 MG capsule Take 1 capsule (100 mg total) by mouth 2 (two) times daily as needed for cough. (Patient not taking: Reported on 08/20/2024) 20 capsule 0   misoprostol  (CYTOTEC ) 200 MCG tablet Place two tablets in the vagina the night prior to your next clinic appointment (Patient not taking: Reported on 10/05/2023) 2 tablet 0   No facility-administered medications prior to visit.    No Known Allergies  Review of Systems  Constitutional:  Negative for chills, fever and malaise/fatigue.  HENT:  Negative for congestion and hearing loss.   Eyes:  Negative for blurred vision and discharge.   Respiratory:  Negative for cough, sputum production and shortness of breath.   Cardiovascular:  Negative for chest pain, palpitations and leg swelling.  Gastrointestinal:  Negative for abdominal pain, blood in stool, constipation, diarrhea, heartburn, nausea and vomiting.  Genitourinary:  Negative for dysuria, frequency, hematuria and urgency.  Musculoskeletal:  Negative for back pain,  falls and myalgias.  Skin:  Negative for rash.  Neurological:  Negative for dizziness, sensory change, loss of consciousness, weakness and headaches.  Endo/Heme/Allergies:  Negative for environmental allergies. Does not bruise/bleed easily.  Psychiatric/Behavioral:  Negative for depression and suicidal ideas. The patient is not nervous/anxious and does not have insomnia.        Objective:    Physical Exam Vitals and nursing note reviewed.  Constitutional:      General: She is not in acute distress.    Appearance: Normal appearance. She is well-developed.  HENT:     Head: Normocephalic and atraumatic.     Right Ear: Tympanic membrane, ear canal and external ear normal. There is no impacted cerumen.     Left Ear: Tympanic membrane, ear canal and external ear normal. There is no impacted cerumen.     Nose: Nose normal.     Mouth/Throat:     Mouth: Mucous membranes are moist.     Pharynx: Oropharynx is clear. No oropharyngeal exudate or posterior oropharyngeal erythema.  Eyes:     General: No scleral icterus.       Right eye: No discharge.        Left eye: No discharge.     Conjunctiva/sclera: Conjunctivae normal.     Pupils: Pupils are equal, round, and reactive to light.  Neck:     Thyroid : No thyromegaly or thyroid  tenderness.     Vascular: No JVD.  Cardiovascular:     Rate and Rhythm: Normal rate and regular rhythm.     Heart sounds: Normal heart sounds. No murmur heard. Pulmonary:     Effort: Pulmonary effort is normal. No respiratory distress.     Breath sounds: Normal breath sounds.   Abdominal:     General: Bowel sounds are normal. There is no distension.     Palpations: Abdomen is soft. There is no mass.     Tenderness: There is no abdominal tenderness. There is no guarding or rebound.  Musculoskeletal:        General: Normal range of motion.     Cervical back: Normal range of motion and neck supple.     Right lower leg: No edema.     Left lower leg: No edema.  Lymphadenopathy:     Cervical: No cervical adenopathy.  Skin:    General: Skin is warm and dry.     Findings: No erythema or rash.  Neurological:     Mental Status: She is alert and oriented to person, place, and time.     Cranial Nerves: No cranial nerve deficit.     Deep Tendon Reflexes: Reflexes are normal and symmetric.  Psychiatric:        Mood and Affect: Mood normal.        Behavior: Behavior normal.        Thought Content: Thought content normal.        Judgment: Judgment normal.     BP 106/60 (BP Location: Left Arm, Patient Position: Sitting, Cuff Size: Normal)   Pulse 66   Temp 98.2 F (36.8 C) (Oral)   Resp 16   Ht 5' 8 (1.727 m)   Wt 160 lb 9.6 oz (72.8 kg)   SpO2 98%   BMI 24.42 kg/m  Wt Readings from Last 3 Encounters:  08/20/24 160 lb 9.6 oz (72.8 kg)  10/05/23 155 lb (70.3 kg)  08/10/23 154 lb (69.9 kg)    Diabetic Foot Exam - Simple   No data filed  Lab Results  Component Value Date   WBC 6.2 03/21/2023   HGB 12.6 03/21/2023   HCT 37.5 03/21/2023   PLT 385.0 03/21/2023   GLUCOSE 126 (H) 03/21/2023   CHOL 243 (H) 03/21/2023   TRIG 173.0 (H) 03/21/2023   HDL 86.30 03/21/2023   LDLCALC 122 (H) 03/21/2023   ALT 9 03/21/2023   AST 12 03/21/2023   NA 139 03/21/2023   K 4.1 03/21/2023   CL 106 03/21/2023   CREATININE 0.77 03/21/2023   BUN 14 03/21/2023   CO2 26 03/21/2023   TSH 0.70 03/21/2023   HGBA1C 5.4 03/24/2023    Lab Results  Component Value Date   TSH 0.70 03/21/2023   Lab Results  Component Value Date   WBC 6.2 03/21/2023   HGB 12.6  03/21/2023   HCT 37.5 03/21/2023   MCV 94.9 03/21/2023   PLT 385.0 03/21/2023   Lab Results  Component Value Date   NA 139 03/21/2023   K 4.1 03/21/2023   CO2 26 03/21/2023   GLUCOSE 126 (H) 03/21/2023   BUN 14 03/21/2023   CREATININE 0.77 03/21/2023   BILITOT 0.3 03/21/2023   ALKPHOS 59 03/21/2023   AST 12 03/21/2023   ALT 9 03/21/2023   PROT 7.1 03/21/2023   ALBUMIN 4.3 03/21/2023   CALCIUM 9.8 03/21/2023   GFR 92.54 03/21/2023   Lab Results  Component Value Date   CHOL 243 (H) 03/21/2023   Lab Results  Component Value Date   HDL 86.30 03/21/2023   Lab Results  Component Value Date   LDLCALC 122 (H) 03/21/2023   Lab Results  Component Value Date   TRIG 173.0 (H) 03/21/2023   Lab Results  Component Value Date   CHOLHDL 3 03/21/2023   Lab Results  Component Value Date   HGBA1C 5.4 03/24/2023       Assessment & Plan:  Preventative health care Assessment & Plan: Ghm utd Check labs  See AVS  Health Maintenance  Topic Date Due   Hepatitis B Vaccines 19-59 Average Risk (1 of 3 - 19+ 3-dose series) Never done   COVID-19 Vaccine (7 - Pfizer risk 2024-25 season) 03/17/2024   MAMMOGRAM  06/08/2024   INFLUENZA VACCINE  07/27/2024   Cervical Cancer Screening (HPV/Pap Cotest)  03/20/2026   DTaP/Tdap/Td (3 - Td or Tdap) 02/28/2031   Colonoscopy  05/09/2033   Hepatitis C Screening  Completed   HIV Screening  Completed   Pneumococcal Vaccine  Aged Out   HPV VACCINES  Aged Out   Meningococcal B Vaccine  Aged Out     Orders: -     CBC with Differential/Platelet -     Comprehensive metabolic panel with GFR -     Lipid panel -     TSH -     Hepatitis B surface antibody,quantitative  Hyperglycemia Assessment & Plan: Watch simple sugars and starches  Check labs    Vitamin D  deficiency -     VITAMIN D  25 Hydroxy (Vit-D Deficiency, Fractures)  Hyperlipidemia, unspecified hyperlipidemia type Assessment & Plan: Check labs today  Orders: -      Comprehensive metabolic panel with GFR -     Lipid panel  Assessment and Plan Assessment & Plan Adult Wellness Visit   Routine adult wellness visit with no acute concerns. Blood pressure is 106/60 mmHg. Family history of cardiovascular issues discussed, including her mother's recent triple bypass surgery. No current chest pain or shortness of breath. Recent history of resolved respiratory illness. No  current joint pain, but history of knee pain noted. She exercises regularly with daily walks. Up to date with dental visits, but has not visited the eye doctor this year. Emphasized the importance of regular eye exams, especially for those over 40. Perform routine blood work today as fasting is not required. Advise visiting the eye doctor for a routine check-up. Discuss the availability of flu and COVID-19 vaccinations.  History of knee pain, resolved   History of knee pain that required injections in the past, but currently resolved. No current issues with knees.  History of acute bronchitis, resolved   Recent history of acute bronchitis, now resolved. No current respiratory symptoms.  Intrauterine contraceptive device in situ   Intrauterine contraceptive device placed a few months ago by a PA in the gynecologist's office. Follow up with the gynecologist's office if needed.   + Jamee JONELLE Antonio Cyndee, DO

## 2024-08-20 NOTE — Assessment & Plan Note (Signed)
Watch simple sugars and starches Check labs

## 2024-08-20 NOTE — Assessment & Plan Note (Signed)
 Check labs today.

## 2024-08-20 NOTE — Assessment & Plan Note (Signed)
 Ghm utd Check labs  See AVS  Health Maintenance  Topic Date Due   Hepatitis B Vaccines 19-59 Average Risk (1 of 3 - 19+ 3-dose series) Never done   COVID-19 Vaccine (7 - Pfizer risk 2024-25 season) 03/17/2024   MAMMOGRAM  06/08/2024   INFLUENZA VACCINE  07/27/2024   Cervical Cancer Screening (HPV/Pap Cotest)  03/20/2026   DTaP/Tdap/Td (3 - Td or Tdap) 02/28/2031   Colonoscopy  05/09/2033   Hepatitis C Screening  Completed   HIV Screening  Completed   Pneumococcal Vaccine  Aged Out   HPV VACCINES  Aged Out   Meningococcal B Vaccine  Aged Out

## 2024-08-21 LAB — VITAMIN D 25 HYDROXY (VIT D DEFICIENCY, FRACTURES): VITD: 18.17 ng/mL — ABNORMAL LOW (ref 30.00–100.00)

## 2024-08-21 LAB — COMPREHENSIVE METABOLIC PANEL WITH GFR
ALT: 22 U/L (ref 0–35)
AST: 16 U/L (ref 0–37)
Albumin: 4.3 g/dL (ref 3.5–5.2)
Alkaline Phosphatase: 96 U/L (ref 39–117)
BUN: 12 mg/dL (ref 6–23)
CO2: 28 meq/L (ref 19–32)
Calcium: 9.2 mg/dL (ref 8.4–10.5)
Chloride: 102 meq/L (ref 96–112)
Creatinine, Ser: 0.85 mg/dL (ref 0.40–1.20)
GFR: 81.37 mL/min (ref >60.00)
Glucose, Bld: 88 mg/dL (ref 70–99)
Potassium: 4.2 meq/L (ref 3.5–5.1)
Sodium: 140 meq/L (ref 135–145)
Total Bilirubin: 0.5 mg/dL (ref 0.2–1.2)
Total Protein: 6.8 g/dL (ref 6.0–8.3)

## 2024-08-21 LAB — CBC WITH DIFFERENTIAL/PLATELET
Basophils Absolute: 0 K/uL (ref 0.0–0.1)
Basophils Relative: 0.8 % (ref 0.0–3.0)
Eosinophils Absolute: 0.1 K/uL (ref 0.0–0.7)
Eosinophils Relative: 1.4 % (ref 0.0–5.0)
HCT: 39.9 % (ref 36.0–46.0)
Hemoglobin: 13.2 g/dL (ref 12.0–15.0)
Lymphocytes Relative: 29.7 % (ref 12.0–46.0)
Lymphs Abs: 1.6 K/uL (ref 0.7–4.0)
MCHC: 33.1 g/dL (ref 30.0–36.0)
MCV: 97.2 fl (ref 78.0–100.0)
Monocytes Absolute: 0.3 K/uL (ref 0.1–1.0)
Monocytes Relative: 6.1 % (ref 3.0–12.0)
Neutro Abs: 3.3 K/uL (ref 1.4–7.7)
Neutrophils Relative %: 62 % (ref 43.0–77.0)
Platelets: 294 K/uL (ref 150.0–400.0)
RBC: 4.11 Mil/uL (ref 3.87–5.11)
RDW: 12.7 % (ref 11.5–15.5)
WBC: 5.4 K/uL (ref 4.0–10.5)

## 2024-08-21 LAB — LIPID PANEL
Cholesterol: 179 mg/dL (ref 0–200)
HDL: 69.7 mg/dL (ref >39.00)
LDL Cholesterol: 86 mg/dL (ref 0–99)
NonHDL: 109.31
Total CHOL/HDL Ratio: 3
Triglycerides: 116 mg/dL (ref 0.0–149.0)
VLDL: 23.2 mg/dL (ref 0.0–40.0)

## 2024-08-21 LAB — TSH: TSH: 0.71 u[IU]/mL (ref 0.35–5.50)

## 2024-08-21 LAB — HEPATITIS B SURFACE ANTIBODY, QUANTITATIVE: Hep B S AB Quant (Post): <5 m[IU]/mL — ABNORMAL LOW (ref >=10)

## 2024-08-23 ENCOUNTER — Ambulatory Visit
Admission: RE | Admit: 2024-08-23 | Discharge: 2024-08-23 | Disposition: A | Discharge status: Home / Self Care | Source: Ambulatory Visit | Attending: Family Medicine | Admitting: Family Medicine

## 2024-08-23 DIAGNOSIS — Z1231 Encounter for screening mammogram for malignant neoplasm of breast: Secondary | ICD-10-CM

## 2024-08-27 ENCOUNTER — Ambulatory Visit: Payer: Self-pay | Admitting: Family Medicine

## 2024-08-28 MED ORDER — VITAMIN D (ERGOCALCIFEROL) 1.25 MG (50000 UNIT) PO CAPS: 50000.0000 [IU] | ORAL_CAPSULE | ORAL | 0 refills | Status: DC

## 2024-08-29 ENCOUNTER — Telehealth: Payer: Self-pay | Admitting: Family Medicine

## 2024-08-29 NOTE — Telephone Encounter (Signed)
 Pt dropped off papers to be filled out by pcp. Placed papers in pcps box. Please call pt when papers have been faxed. Also pt wants to set up an appt for a hep b shot.please call when orders have been placed

## 2024-08-30 ENCOUNTER — Other Ambulatory Visit: Payer: Self-pay | Admitting: Family Medicine

## 2024-08-30 DIAGNOSIS — R928 Other abnormal and inconclusive findings on diagnostic imaging of breast: Secondary | ICD-10-CM

## 2024-09-05 NOTE — Telephone Encounter (Signed)
 Form faxed

## 2024-09-10 ENCOUNTER — Ambulatory Visit

## 2024-09-10 ENCOUNTER — Ambulatory Visit
Admission: RE | Admit: 2024-09-10 | Discharge: 2024-09-10 | Disposition: A | Discharge status: Home / Self Care | Source: Ambulatory Visit | Attending: Family Medicine | Admitting: Family Medicine

## 2024-09-10 DIAGNOSIS — R928 Other abnormal and inconclusive findings on diagnostic imaging of breast: Secondary | ICD-10-CM

## 2024-11-18 ENCOUNTER — Other Ambulatory Visit: Payer: Self-pay | Admitting: Family Medicine
